# Patient Record
Sex: Female | Born: 1965 | Race: White | Hispanic: No | Marital: Married | Smoking: Former smoker
Health system: Southern US, Community
[De-identification: ages and names within clinical notes are randomized; demographics above are authoritative.]

## PROBLEM LIST (undated history)

## (undated) DIAGNOSIS — K219 Gastro-esophageal reflux disease without esophagitis: Secondary | ICD-10-CM

## (undated) DIAGNOSIS — I2510 Atherosclerotic heart disease of native coronary artery without angina pectoris: Secondary | ICD-10-CM

## (undated) DIAGNOSIS — F329 Major depressive disorder, single episode, unspecified: Secondary | ICD-10-CM

## (undated) DIAGNOSIS — E119 Type 2 diabetes mellitus without complications: Secondary | ICD-10-CM

## (undated) DIAGNOSIS — R011 Cardiac murmur, unspecified: Secondary | ICD-10-CM

## (undated) DIAGNOSIS — R002 Palpitations: Secondary | ICD-10-CM

## (undated) DIAGNOSIS — E785 Hyperlipidemia, unspecified: Secondary | ICD-10-CM

## (undated) DIAGNOSIS — T4145XA Adverse effect of unspecified anesthetic, initial encounter: Secondary | ICD-10-CM

## (undated) DIAGNOSIS — O24419 Gestational diabetes mellitus in pregnancy, unspecified control: Secondary | ICD-10-CM

## (undated) DIAGNOSIS — D649 Anemia, unspecified: Secondary | ICD-10-CM

## (undated) DIAGNOSIS — F32A Depression, unspecified: Secondary | ICD-10-CM

## (undated) DIAGNOSIS — M549 Dorsalgia, unspecified: Secondary | ICD-10-CM

## (undated) DIAGNOSIS — T8859XA Other complications of anesthesia, initial encounter: Secondary | ICD-10-CM

## (undated) DIAGNOSIS — D219 Benign neoplasm of connective and other soft tissue, unspecified: Secondary | ICD-10-CM

## (undated) DIAGNOSIS — I10 Essential (primary) hypertension: Secondary | ICD-10-CM

## (undated) DIAGNOSIS — F419 Anxiety disorder, unspecified: Secondary | ICD-10-CM

## (undated) DIAGNOSIS — Z86010 Personal history of colonic polyps: Secondary | ICD-10-CM

## (undated) HISTORY — DX: Atherosclerotic heart disease of native coronary artery without angina pectoris: I25.10

## (undated) HISTORY — DX: Gastro-esophageal reflux disease without esophagitis: K21.9

## (undated) HISTORY — DX: Dorsalgia, unspecified: M54.9

## (undated) HISTORY — DX: Gestational diabetes mellitus in pregnancy, unspecified control: O24.419

## (undated) HISTORY — DX: Palpitations: R00.2

## (undated) HISTORY — DX: Hyperlipidemia, unspecified: E78.5

## (undated) HISTORY — DX: Type 2 diabetes mellitus without complications: E11.9

## (undated) HISTORY — PX: COLONOSCOPY: SHX174

## (undated) HISTORY — DX: Benign neoplasm of connective and other soft tissue, unspecified: D21.9

## (undated) HISTORY — PX: POLYPECTOMY: SHX149

## (undated) HISTORY — PX: BACK SURGERY: SHX140

## (undated) HISTORY — DX: Personal history of colonic polyps: Z86.010

## (undated) HISTORY — PX: WISDOM TOOTH EXTRACTION: SHX21

---

## 1997-11-23 ENCOUNTER — Other Ambulatory Visit: Admission: RE | Admit: 1997-11-23 | Discharge: 1997-11-23 | Payer: Self-pay | Admitting: Obstetrics and Gynecology

## 1998-10-04 ENCOUNTER — Encounter: Admission: RE | Admit: 1998-10-04 | Discharge: 1999-01-02 | Payer: Self-pay | Admitting: Gynecology

## 1999-02-05 HISTORY — PX: TUBAL LIGATION: SHX77

## 1999-04-17 ENCOUNTER — Other Ambulatory Visit: Admission: RE | Admit: 1999-04-17 | Discharge: 1999-04-17 | Payer: Self-pay | Admitting: Internal Medicine

## 1999-07-25 ENCOUNTER — Ambulatory Visit (HOSPITAL_COMMUNITY): Admission: RE | Admit: 1999-07-25 | Discharge: 1999-07-25 | Payer: Self-pay | Admitting: Gynecology

## 1999-07-25 ENCOUNTER — Encounter: Admission: RE | Admit: 1999-07-25 | Discharge: 1999-10-23 | Payer: Self-pay | Admitting: Gynecology

## 1999-08-27 ENCOUNTER — Encounter: Payer: Self-pay | Admitting: Gynecology

## 1999-08-27 ENCOUNTER — Ambulatory Visit (HOSPITAL_COMMUNITY): Admission: RE | Admit: 1999-08-27 | Discharge: 1999-08-27 | Payer: Self-pay | Admitting: Gynecology

## 1999-10-04 ENCOUNTER — Inpatient Hospital Stay (HOSPITAL_COMMUNITY): Admission: AD | Admit: 1999-10-04 | Discharge: 1999-10-07 | Payer: Self-pay | Admitting: Gynecology

## 1999-10-04 ENCOUNTER — Encounter (INDEPENDENT_AMBULATORY_CARE_PROVIDER_SITE_OTHER): Payer: Self-pay

## 1999-11-05 ENCOUNTER — Other Ambulatory Visit: Admission: RE | Admit: 1999-11-05 | Discharge: 1999-11-05 | Payer: Self-pay | Admitting: Gynecology

## 2000-11-28 ENCOUNTER — Other Ambulatory Visit: Admission: RE | Admit: 2000-11-28 | Discharge: 2000-11-28 | Payer: Self-pay | Admitting: Obstetrics and Gynecology

## 2001-12-11 ENCOUNTER — Other Ambulatory Visit: Admission: RE | Admit: 2001-12-11 | Discharge: 2001-12-11 | Payer: Self-pay | Admitting: Obstetrics and Gynecology

## 2002-11-10 ENCOUNTER — Other Ambulatory Visit: Admission: RE | Admit: 2002-11-10 | Discharge: 2002-11-10 | Payer: Self-pay | Admitting: Obstetrics and Gynecology

## 2002-12-22 ENCOUNTER — Encounter: Payer: Self-pay | Admitting: Internal Medicine

## 2002-12-24 ENCOUNTER — Encounter (INDEPENDENT_AMBULATORY_CARE_PROVIDER_SITE_OTHER): Payer: Self-pay | Admitting: Specialist

## 2002-12-24 ENCOUNTER — Encounter: Payer: Self-pay | Admitting: Internal Medicine

## 2002-12-24 ENCOUNTER — Ambulatory Visit (HOSPITAL_COMMUNITY): Admission: RE | Admit: 2002-12-24 | Discharge: 2002-12-24 | Payer: Self-pay | Admitting: Internal Medicine

## 2002-12-24 DIAGNOSIS — Z860101 Personal history of adenomatous and serrated colon polyps: Secondary | ICD-10-CM

## 2002-12-24 DIAGNOSIS — Z86010 Personal history of colonic polyps: Secondary | ICD-10-CM

## 2002-12-24 HISTORY — DX: Personal history of adenomatous and serrated colon polyps: Z86.0101

## 2002-12-24 HISTORY — DX: Personal history of colonic polyps: Z86.010

## 2003-01-18 ENCOUNTER — Encounter: Payer: Self-pay | Admitting: Internal Medicine

## 2003-12-21 ENCOUNTER — Other Ambulatory Visit: Admission: RE | Admit: 2003-12-21 | Discharge: 2003-12-21 | Payer: Self-pay | Admitting: Obstetrics and Gynecology

## 2004-01-05 ENCOUNTER — Ambulatory Visit: Payer: Self-pay | Admitting: Internal Medicine

## 2004-01-18 ENCOUNTER — Ambulatory Visit: Payer: Self-pay | Admitting: Internal Medicine

## 2004-02-05 HISTORY — PX: NOVASURE ABLATION: SHX5394

## 2004-11-06 ENCOUNTER — Ambulatory Visit (HOSPITAL_COMMUNITY): Admission: RE | Admit: 2004-11-06 | Discharge: 2004-11-06 | Payer: Self-pay | Admitting: Obstetrics and Gynecology

## 2004-11-06 ENCOUNTER — Encounter (INDEPENDENT_AMBULATORY_CARE_PROVIDER_SITE_OTHER): Payer: Self-pay | Admitting: Specialist

## 2005-01-01 ENCOUNTER — Other Ambulatory Visit: Admission: RE | Admit: 2005-01-01 | Discharge: 2005-01-01 | Payer: Self-pay | Admitting: Obstetrics and Gynecology

## 2005-01-08 ENCOUNTER — Ambulatory Visit: Payer: Self-pay | Admitting: Internal Medicine

## 2005-02-19 ENCOUNTER — Ambulatory Visit: Payer: Self-pay | Admitting: Internal Medicine

## 2005-02-20 ENCOUNTER — Encounter: Payer: Self-pay | Admitting: Internal Medicine

## 2005-06-05 ENCOUNTER — Encounter: Payer: Self-pay | Admitting: Gynecology

## 2005-10-22 ENCOUNTER — Ambulatory Visit: Payer: Self-pay | Admitting: Internal Medicine

## 2005-10-28 ENCOUNTER — Encounter: Payer: Self-pay | Admitting: Internal Medicine

## 2005-10-28 ENCOUNTER — Ambulatory Visit (HOSPITAL_COMMUNITY): Admission: RE | Admit: 2005-10-28 | Discharge: 2005-10-28 | Payer: Self-pay | Admitting: Internal Medicine

## 2006-01-02 ENCOUNTER — Other Ambulatory Visit: Admission: RE | Admit: 2006-01-02 | Discharge: 2006-01-02 | Payer: Self-pay | Admitting: Obstetrics and Gynecology

## 2007-01-23 ENCOUNTER — Other Ambulatory Visit: Admission: RE | Admit: 2007-01-23 | Discharge: 2007-01-23 | Payer: Self-pay | Admitting: Obstetrics and Gynecology

## 2008-03-28 ENCOUNTER — Encounter: Payer: Self-pay | Admitting: Obstetrics and Gynecology

## 2008-03-28 ENCOUNTER — Ambulatory Visit: Payer: Self-pay | Admitting: Obstetrics and Gynecology

## 2008-03-28 ENCOUNTER — Other Ambulatory Visit: Admission: RE | Admit: 2008-03-28 | Discharge: 2008-03-28 | Payer: Self-pay | Admitting: Obstetrics and Gynecology

## 2008-03-29 ENCOUNTER — Encounter: Admission: RE | Admit: 2008-03-29 | Discharge: 2008-03-29 | Payer: Self-pay | Admitting: Obstetrics and Gynecology

## 2008-04-01 ENCOUNTER — Encounter: Admission: RE | Admit: 2008-04-01 | Discharge: 2008-04-01 | Payer: Self-pay | Admitting: Obstetrics and Gynecology

## 2008-04-08 ENCOUNTER — Emergency Department (HOSPITAL_COMMUNITY): Admission: EM | Admit: 2008-04-08 | Discharge: 2008-04-09 | Payer: Self-pay | Admitting: Emergency Medicine

## 2008-04-09 ENCOUNTER — Inpatient Hospital Stay (HOSPITAL_COMMUNITY): Admission: EM | Admit: 2008-04-09 | Discharge: 2008-04-11 | Payer: Self-pay | Admitting: *Deleted

## 2008-04-09 ENCOUNTER — Ambulatory Visit: Payer: Self-pay | Admitting: *Deleted

## 2008-04-13 ENCOUNTER — Encounter: Admission: RE | Admit: 2008-04-13 | Discharge: 2008-04-13 | Payer: Self-pay | Admitting: Obstetrics and Gynecology

## 2008-04-13 ENCOUNTER — Encounter: Payer: Self-pay | Admitting: Obstetrics and Gynecology

## 2008-05-23 ENCOUNTER — Ambulatory Visit: Payer: Self-pay | Admitting: Obstetrics and Gynecology

## 2008-11-18 ENCOUNTER — Encounter (INDEPENDENT_AMBULATORY_CARE_PROVIDER_SITE_OTHER): Payer: Self-pay | Admitting: *Deleted

## 2009-03-01 ENCOUNTER — Encounter (INDEPENDENT_AMBULATORY_CARE_PROVIDER_SITE_OTHER): Payer: Self-pay | Admitting: *Deleted

## 2009-03-10 LAB — LIPID PANEL
Cholesterol: 264 mg/dL — AB (ref 0–200)
HDL: 91 mg/dL — AB (ref 35–70)
LDL Cholesterol: 160 mg/dL
Triglycerides: 59 mg/dL (ref 40–160)

## 2009-03-10 LAB — BASIC METABOLIC PANEL: BUN: 21 mg/dL (ref 4–21)

## 2009-03-22 ENCOUNTER — Encounter (INDEPENDENT_AMBULATORY_CARE_PROVIDER_SITE_OTHER): Payer: Self-pay | Admitting: *Deleted

## 2009-03-27 ENCOUNTER — Ambulatory Visit: Payer: Self-pay | Admitting: Internal Medicine

## 2009-04-10 ENCOUNTER — Ambulatory Visit: Payer: Self-pay | Admitting: Internal Medicine

## 2009-04-13 ENCOUNTER — Encounter: Payer: Self-pay | Admitting: Internal Medicine

## 2009-04-17 ENCOUNTER — Encounter: Admission: RE | Admit: 2009-04-17 | Discharge: 2009-04-17 | Payer: Self-pay | Admitting: Obstetrics and Gynecology

## 2009-04-19 ENCOUNTER — Other Ambulatory Visit: Admission: RE | Admit: 2009-04-19 | Discharge: 2009-04-19 | Payer: Self-pay | Admitting: Obstetrics and Gynecology

## 2009-04-19 ENCOUNTER — Ambulatory Visit: Payer: Self-pay | Admitting: Obstetrics and Gynecology

## 2010-02-12 ENCOUNTER — Encounter: Payer: Self-pay | Admitting: Cardiovascular Disease

## 2010-02-12 DIAGNOSIS — E785 Hyperlipidemia, unspecified: Secondary | ICD-10-CM | POA: Insufficient documentation

## 2010-02-12 DIAGNOSIS — R002 Palpitations: Secondary | ICD-10-CM | POA: Insufficient documentation

## 2010-02-12 DIAGNOSIS — K30 Functional dyspepsia: Secondary | ICD-10-CM | POA: Insufficient documentation

## 2010-02-24 ENCOUNTER — Encounter: Payer: Self-pay | Admitting: Obstetrics and Gynecology

## 2010-03-07 ENCOUNTER — Ambulatory Visit (INDEPENDENT_AMBULATORY_CARE_PROVIDER_SITE_OTHER): Payer: BC Managed Care – PPO | Admitting: Cardiovascular Disease

## 2010-03-07 ENCOUNTER — Other Ambulatory Visit: Payer: Self-pay

## 2010-03-07 DIAGNOSIS — E7800 Pure hypercholesterolemia, unspecified: Secondary | ICD-10-CM

## 2010-03-08 NOTE — Letter (Signed)
Summary: [facility] Instructions  [facility]  [address] [place], [place] [address]   Phone: [phone]  Fax: [phone]       Nancy Sparks    25-Sep-1965    MRN: [id number]        Procedure Day /Date:  Monday 04/10/2009     Arrival Time: 8:00 am      Procedure Time: 9:00 am     Location of Procedure:                    _x _   Endoscopy Center (4th Floor)                        PREPARATION FOR COLONOSCOPY WITH MOVIPREP   Starting 5 days prior to your procedure Wednesday 3/2 do not eat nuts, seeds, popcorn, corn, beans, peas,  salads, or any raw vegetables.  Do not take any fiber supplements (e.g. Metamucil, Citrucel, and Benefiber).  THE DAY BEFORE YOUR PROCEDURE         DATE: Sunday 3/6  1.  Drink clear liquids the entire day-NO SOLID FOOD  2.  Do not drink anything colored red or purple.  Avoid juices with pulp.  No orange juice.  3.  Drink at least 64 oz. (8 glasses) of fluid/clear liquids during the day to prevent dehydration and help the prep work efficiently.  CLEAR LIQUIDS INCLUDE: Water Jello Ice Popsicles Tea (sugar ok, no milk/cream) Powdered fruit flavored drinks Coffee (sugar ok, no milk/cream) Gatorade Juice: apple, white grape, white cranberry  Lemonade Clear bullion, consomm, broth Carbonated beverages (any kind) Strained chicken noodle soup Hard Candy                             4.  In the morning, mix first dose of MoviPrep solution:    Empty 1 Pouch A and 1 Pouch B into the disposable container    Add lukewarm drinking water to the top line of the container. Mix to dissolve    Refrigerate (mixed solution should be used within 24 hrs)  5.  Begin drinking the prep at 5:00 p.m. The MoviPrep container is divided by 4 marks.   Every 15 minutes drink the solution down to the next mark (approximately 8 oz) until the full liter is complete.   6.  Follow completed prep with 16 oz of clear liquid of your choice (Nothing red  or purple).  Continue to drink clear liquids until bedtime.  7.  Before going to bed, mix second dose of MoviPrep solution:    Empty 1 Pouch A and 1 Pouch B into the disposable container    Add lukewarm drinking water to the top line of the container. Mix to dissolve    Refrigerate  THE DAY OF YOUR PROCEDURE      DATE: Monday 3/7  Beginning at 4:00 am (5 hours before procedure):         1. Every 15 minutes, drink the solution down to the next mark (approx 8 oz) until the full liter is complete.  2. Follow completed prep with 16 oz. of clear liquid of your choice.    3. You may drink clear liquids until 7:00 am  (2 HOURS BEFORE PROCEDURE).   MEDICATION INSTRUCTIONS  Unless otherwise instructed, you should take regular prescription medications with a small sip of water   as early as possible the morning  of your procedure.          OTHER INSTRUCTIONS  You will need a responsible adult at least 46 years of age to accompany you and drive you home.   This person must remain in the waiting room during your procedure.  Wear loose fitting clothing that is easily removed.  Leave jewelry and other valuables at home.  However, you may wish to bring a book to read or  an iPod/MP3 player to listen to music as you wait for your procedure to start.  Remove all body piercing jewelry and leave at home.  Total time from sign-in until discharge is approximately 2-3 hours.  You should go home directly after your procedure and rest.  You can resume normal activities the  day after your procedure.  The day of your procedure you should not:   Drive   Make legal decisions   Operate machinery   Drink alcohol   Return to work  You will receive specific instructions about eating, activities and medications before you leave.    The above instructions have been reviewed and explained to me by   Ezra Sites RN  March 27, 2009 10:28 AM     I fully understand and can verbalize  these instructions _____________________________ Date _________

## 2010-03-08 NOTE — Letter (Signed)
Summary: Previsit letter  [facility]  [address] [place], [place] [address]   Phone: [phone]  Fax: [phone]       03/01/2009 MRN: [id number]  Nancy Sparks [address] [place], [place]  [address]  Dear Ms. Castellana,  Welcome to the Gastroenterology Division at [facility].    You are scheduled to see a nurse for your pre-procedure visit on 03-27-09 at 10:00a.m. on the 3rd floor at [facility], [address]  We ask that you try to arrive at our office 15 minutes prior to your appointment time to allow for check-in.  Your nurse visit will consist of discussing your medical and surgical history, your immediate family medical history, and your medications.    Please bring a complete list of all your medications or, if you prefer, bring the medication bottles and we will list them.  We will need to be aware of both prescribed and over the counter drugs.  We will need to know exact dosage information as well.  If you are on blood thinners (Coumadin, Plavix, Aggrenox, Ticlid, etc.) please call our office today/prior to your appointment, as we need to consult with your physician about holding your medication.   Please be prepared to read and sign documents such as consent forms, a financial agreement, and acknowledgement forms.  If necessary, and with your consent, a friend or relative is welcome to sit-in on the nurse visit with you.  Please bring your insurance card so that we may make a copy of it.  If your insurance requires a referral to see a specialist, please bring your referral form from your primary care physician.  No co-pay is required for this nurse visit.     If you cannot keep your appointment, please call [phone] to cancel or reschedule prior to your appointment date.  This allows [place] the opportunity to schedule an appointment for another patient in need of care.    Thank you for choosing [facility] for your medical needs.  We  appreciate the opportunity to care for you.  Please visit [place] at our website  to learn more about our practice.                     Sincerely.                                                                                                                   The Gastroenterology Division

## 2010-03-08 NOTE — Procedures (Signed)
Summary: Colonoscopy/[facility]  Colonoscopy/[facility]   Imported By: Sherian Rein 04/28/2009 09:29:50  _____________________________________________________________________  External Attachment:    Type:   Image     Comment:   External Document

## 2010-03-08 NOTE — Miscellaneous (Signed)
Summary: [facility]  Clinical Lists Changes  Medications: Added new medication of MOVIPREP 100 GM  SOLR (PEG-KCL-NACL-NASULF-NA ASC-C) As per prep instructions. - Signed Rx of MOVIPREP 100 GM  SOLR (PEG-KCL-NACL-NASULF-NA ASC-C) As per prep instructions.;  #1 x 0;  Signed;  Entered by: Ezra Sites RN;  Authorized by: Iva Boop MD, [facility];  Method used: Electronically to [facility]. [place]. [phone]*, [address] [place], [place], [place]  [address], Ph: [phone] or [phone], Fax: [phone] Observations: Added new observation of NKA: T (03/27/2009 10:08)    Prescriptions: MOVIPREP 100 GM  SOLR (PEG-KCL-NACL-NASULF-NA ASC-C) As per prep instructions.  #1 x 0   Entered by:   Ezra Sites RN   Authorized by:   Iva Boop MD, [facility]   Signed by:   Ezra Sites RN on 03/27/2009   Method used:   Electronically to        [facility]. [address] [phone]* (retail)       [address]       [place], [place]  [address]       Ph: [phone] or [phone]       Fax: [phone]   RxID:   [id number]

## 2010-03-08 NOTE — Progress Notes (Signed)
Summary: [facility]   Imported By: Sherian Rein 04/28/2009 09:33:47  _____________________________________________________________________  External Attachment:    Type:   Image     Comment:   External Document

## 2010-03-08 NOTE — Progress Notes (Signed)
Summary: [facility]   Imported By: Sherian Rein 04/28/2009 09:31:44  _____________________________________________________________________  External Attachment:    Type:   Image     Comment:   External Document

## 2010-03-08 NOTE — Letter (Signed)
Summary: Patient Notice-Hyperplastic Polyps  [facility]  [address] [place], [place] [address]   Phone: [phone]  Fax: [phone]        April 13, 2009 MRN: [id number]    Nancy Sparks [address] [place], [place]  [address]    Dear [name],  I am pleased to inform you that the suspected colon polyp removed during your recent colonoscopy was not a colon polyp. It was lymphoid (lymph node-like) tissue that mimiced a polyp. This type of tissue is NOT pre-cancerous.  Since you have had a pre-cancerous polyp before and your sister had colon cancer, you should have a repeat colonoscopy examination in 5 years for routine colorectal cancer screening.  The biopsy of the ulcer showed changes that I think were related to effects of your colon preparation.  Should you develop new or worsening symptoms of abdominal pain, bowel habit changes or bleeding from the rectum or bowels, please schedule an evaluation with either your primary care physician or with me.  Please call us if you are having persistent problems or have questions about your condition that have not been fully answered at this time.    Sincerely,  Ashley Murrain MD, [facility] This letter has been electronically signed by your physician.  Appended Document: Patient Notice-Hyperplastic Polyps Letter mailed 3.10.11

## 2010-03-08 NOTE — Procedures (Signed)
Summary: Colonoscopy  Patient: Nancy Sparks Note: All result statuses are Final unless otherwise noted.  Tests: (1) Colonoscopy (COL)   COL Colonoscopy           DONE     [facility]     [address]     [place], [place]  [address]           COLONOSCOPY PROCEDURE REPORT           PATIENT:  Nancy Sparks, Nancy Sparks  MR#:  [id number]     BIRTHDATE:  [birth year], 45 yrs. 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  Digital rectal exam was performed and     revealed no abnormalities.   The LB PCF-[id number] endoscope     was introduced through the anus and advanced to the cecum, which     was identified by both the appendix and ileocecal valve, without     limitations.  The quality of the prep was excellent, using     MoviPrep.  The instrument was then slowly withdrawn as the colon     was fully examined.     Insertion: 5:21 minutes Withdrawal: 16:30 minutes     <<PROCEDUREIMAGES>>           FINDINGS:  A diminutive polyp was found at the appendiceal     orifice. It was 2 mm in size. The polyp was removed using cold     biopsy forceps.  An ulcer was found in the sigmoid colon.     Superficial ulceration in 2 areas noted in distal sigmoid.     Multiple biopsies were obtained and sent to pathology.  This was     otherwise a normal examination of the colon. Right colon     retroflexion preformed  also.   Retroflexed views in the rectum     revealed no abnormalities.    The scope was then withdrawn from     the patient and the procedure completed.           COMPLICATIONS:  None           ENDOSCOPIC IMPRESSION:     1) 2 mm diminutive polyp at the appendiceal orifice - removed     2) Ulcer in the sigmoid colon - biopsied - ? prep effect vs.     other cause (she has a hx of ischemic colitis in 2004)     3) Otherwise normal examination - excellent prep           4) Prior tubulovillous adenoma removal 2004     5) Family history  of colon cancer <60 yrs in sibling           REPEAT EXAM:  In for Colonoscopy, pending biopsy results. likely 5     years.           Iva Boop, MD, [facility]           CC:  The Patient     Carmelina Peal, MD     Herb Grays, MD           n.     eSIGNED:   Iva Boop at 04/10/2009 10:14 AM           [place], [place], [id number]  Note: An exclamation mark (!) indicates a result that was not dispersed into the flowsheet. Document Creation Date: 04/10/2009 10:14 AM _______________________________________________________________________  (1) Order result status: Final Collection or observation date-time: 04/10/2009 10:01 Requested date-time:  Receipt date-time:  Reported date-time:  Referring Physician:   Ordering Physician: Stan Head [phone]) Specimen Source:  Source: Launa Grill Order Number: [id number] Lab site:   Appended Document: Colonoscopy     Procedures Next Due Date:    Colonoscopy: 04/2014

## 2010-03-09 NOTE — Progress Notes (Signed)
Summary: [facility]   Imported By: Sherian Rein 04/28/2009 09:33:00  _____________________________________________________________________  External Attachment:    Type:   Image     Comment:   External Document

## 2010-04-26 ENCOUNTER — Other Ambulatory Visit: Payer: Self-pay | Admitting: Obstetrics and Gynecology

## 2010-04-26 DIAGNOSIS — Z1231 Encounter for screening mammogram for malignant neoplasm of breast: Secondary | ICD-10-CM

## 2010-05-01 ENCOUNTER — Ambulatory Visit: Payer: BC Managed Care – PPO

## 2010-05-17 LAB — URINALYSIS, ROUTINE W REFLEX MICROSCOPIC
Hgb urine dipstick: NEGATIVE
Ketones, ur: NEGATIVE mg/dL
Protein, ur: NEGATIVE mg/dL
Urobilinogen, UA: 0.2 mg/dL (ref 0.0–1.0)

## 2010-05-17 LAB — CBC
HCT: 41.5 % (ref 36.0–46.0)
MCHC: 34.3 g/dL (ref 30.0–36.0)
MCV: 92.2 fL (ref 78.0–100.0)
Platelets: 178 10*3/uL (ref 150–400)
RBC: 4.5 MIL/uL (ref 3.87–5.11)

## 2010-05-17 LAB — CK TOTAL AND CKMB (NOT AT ARMC)
CK, MB: 2.4 ng/mL (ref 0.3–4.0)
Total CK: 122 U/L (ref 7–177)

## 2010-05-17 LAB — COMPREHENSIVE METABOLIC PANEL
Albumin: 4.7 g/dL (ref 3.5–5.2)
Alkaline Phosphatase: 62 U/L (ref 39–117)
BUN: 17 mg/dL (ref 6–23)
CO2: 22 mEq/L (ref 19–32)
Chloride: 113 mEq/L — ABNORMAL HIGH (ref 96–112)
Creatinine, Ser: 0.79 mg/dL (ref 0.4–1.2)
GFR calc non Af Amer: >60 mL/min (ref >60)
Glucose, Bld: 94 mg/dL (ref 70–99)
Potassium: 4 mEq/L (ref 3.5–5.1)
Total Bilirubin: 0.6 mg/dL (ref 0.3–1.2)

## 2010-05-17 LAB — DIFFERENTIAL
Basophils Relative: 1 % (ref 0–1)
Eosinophils Absolute: 0.1 10*3/uL (ref 0.0–0.7)
Eosinophils Relative: 2 % (ref 0–5)
Monocytes Relative: 9 % (ref 3–12)
Neutrophils Relative %: 62 % (ref 43–77)

## 2010-05-17 LAB — BASIC METABOLIC PANEL
BUN: 17 mg/dL (ref 6–23)
CO2: 23 mEq/L (ref 19–32)
Chloride: 112 mEq/L (ref 96–112)
Creatinine, Ser: 0.8 mg/dL (ref 0.4–1.2)

## 2010-05-17 LAB — ACETAMINOPHEN LEVEL: Acetaminophen (Tylenol), Serum: <10 ug/mL — ABNORMAL LOW (ref 10–30)

## 2010-05-17 LAB — RAPID URINE DRUG SCREEN, HOSP PERFORMED
Barbiturates: NOT DETECTED
Benzodiazepines: POSITIVE — AB
Cocaine: NOT DETECTED

## 2010-05-17 LAB — SALICYLATE LEVEL: Salicylate Lvl: <4 mg/dL (ref 2.8–20.0)

## 2010-05-21 ENCOUNTER — Ambulatory Visit: Payer: BC Managed Care – PPO

## 2010-05-28 ENCOUNTER — Encounter: Payer: BC Managed Care – PPO | Admitting: Obstetrics and Gynecology

## 2010-05-29 ENCOUNTER — Encounter: Payer: Self-pay | Admitting: Cardiovascular Disease
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
## 2010-06-19 NOTE — Discharge Summary (Signed)
NAMEBRI, Nancy Sparks                ACCOUNT NO.:  [id number]   MEDICAL RECORD NO.:  [id number]          PATIENT TYPE:  IPS   LOCATION:  0302                          FACILITY:  [facility]   PHYSICIAN:  Jasmine Pang, M.D. DATE OF BIRTH:  [birth year]   DATE OF ADMISSION:  04/09/2008  DATE OF DISCHARGE:  04/11/2008                               DISCHARGE SUMMARY   IDENTIFICATION:  This is a 45 year old married white female who was  admitted on a voluntary basis on April 09, 2008.   HISTORY OF PRESENT ILLNESS:  The patient presented to the emergency  department at [facility], this is around midnight on April 08, 2008.  Her neighbor stated that the patient had taken 9 temazepam, 2-3  Klonopin, and half bottle of wine after arguing with her husband earlier  in the evening.  The ingestion of pills was not witnessed and when  admitted her urine drug screen was positive for benzodiazepines.  Her  alcohol level was elevated at 189.  She had no abnormality of  electrolytes.  She had no acetaminophen and her chloride was slightly  elevated at 113 from 112.  Otherwise, she had no abnormalities of her  chemistries and urinalysis was also negative.  Her original pregnancy  test was positive.  It was repeated along with a quantitative hCG and  she was found not to be pregnant.  The patient states that she had a  call back on a mammogram a week ago, yesterday.  She is to have a needle  biopsy on Monday morning.  Her husband accompanied her to this callback  meeting where she was told she would have to have a needle biopsy.  She  states she told her husband she did not want to talk about it.  The  argument was over the fact that he had not talked about it and she  realized she was wanting him to read her mind at any rate.  Yesterday,  after she sobered up some, she said she was not suicidal.  However, the  emergency room doctor insisted on taking out commitment papers.  The  commitment papers  read, the patient reports switching pills due to  flight of spells.  She states she is depressed and overwhelmed.  She  also drinks to self medicate.   PAST PSYCHIATRIC HISTORY:  The patient reports that she has had  outpatient care for depression on and off for about 10 years.  She has  recently been under steady care for the past year or so at [facility].   FAMILY HISTORY:  She states her mother is treated with Cymbalta for  depression and anxiety.   ALCOHOL AND DRUG HISTORY:  She denies abuse issues.  She states that she  just got out of hand the other night.   MEDICAL PROBLEMS:  She takes Crestor for high cholesterol.   MEDICATIONS:  1. Cymbalta 30 mg daily.  2. Wellbutrin XL 300 mg daily.  3. Crestor 20 mg p.o. daily.  4. Lysine 1 tablet p.o. daily.  5. Fish oil  1 tablet p.o. daily.  6. Multivitamin 1 tablet p.o. daily.  7. Klonopin 0.5 mg p.o. b.i.d. p.r.n.  8. Temazepam 30 mg at h.s. p.r.n.   ALLERGIES:  She has no known drug allergies.   PHYSICAL FINDINGS:  The patient is a well-developed, well-nourished  white female who appears her stated age of 13.  There were no acute  physical or medical problems noted.   ADMISSION LABORATORIES:  Her lab values have already been discussed and  she is to have a needle biopsy of a lesion noted on a mammogram on  Monday morning.   HOSPITAL COURSE:  Upon admission, the patient was restarted on her home  medications of Cymbalta 30 mg daily, Wellbutrin XL 300 mg daily. Crestor  20 mg daily, multivitamin 1 pill daily, Klonopin 0.5 mg p.o. b.i.d.  p.r.n., and temazepam 30 mg p.o. q.h.s. p.r.n.  She was also started on  Seroquel 15 mg at h.s. and Seroquel 25-50 mg p.o. every 4 hours as  needed for anxiety.  There was no change in her medications and she  tolerated these well with no significant side effects.  Initially upon  admission, the patient was depressed and anxious with depressed affect.  She had normal  motor behavior.  Speech was normal rate and flow.  Level  of consciousness was alert.  There was no psychosis or thought disorder  noted.  Her husband was called by our Child psychotherapist.  A family session  was scheduled.  Main concerns addressed were around how to increase  support for the patient, how to increase communication for all family  members.  The patient's husband and daughters expressed great concern  that if the patient would reach her limit with anxiety and try to kill  herself again.  Afterwards, the patient's daughters left the session  and discharge planning was discussed.  Husband felt better with the  patient staying in the hospital for at least 1 more day and the breast  biopsy that was scheduled for April 11, 2008, was rescheduled, so she  could stay here. The patient was scheduled at [facility] with Saul Fordyce.  They will send a counselor to her at this  visit.  On April 11, 2008, mental status had improved markedly from  admission status.  The patient was less depressed, less anxious.  She  felt the family session had gone well with her husband. Her affect was  consistent with mood, which was stable.  No suicidal or homicidal  ideation.  No thoughts of self-injurious behavior.  No auditory or  visual hallucinations.  No paranoia or delusions.  Thoughts were logical  and goal-directed.  Thought content, no predominant theme.  Cognitive  was grossly intact.  Insight good.  Judgment good.  Impulse control  good.  It was felt the patient was safe for discharge today.   DISCHARGE DIAGNOSES:  Axis I:  Depressive disorder, not otherwise  specified.  Axis II: None.  Axis III: Hypercholesterolemia, breast mass to have a biopsy of the  lesions noted on the mammogram soon.  Axis IV: Moderate (problems with primary support group, burden of  psychiatric illness, burden of medical illness).  Axis V: Global assessment of functioning was 65 at discharge.  GAF  60  upon admission.  GAF highest past year was 75-80.   DISCHARGE PLANS:  There was no specific activity level or dietary  restriction.   POSTHOSPITAL CARE PLANS:  The patient will go to the [facility] to see Saul Fordyce on April 12, 2008 at 1:30 p.m.  She  will also see Charlean Sanfilippo, night counselor and who will call her with  this appointment.   DISCHARGE MEDICATIONS:  1. Cymbalta 30 mg daily.  2. Wellbutrin XL 300 mg daily.  3. Crestor 20 mg daily.  4. Lysine 1 tablet daily.  5. Fish oil 1 tablet daily.  6. Multivitamin 1 tablet daily.  7. Klonopin 0.5 mg b.i.d. as needed.  8. Temazepam 30 mg at bed.      Jasmine Pang, M.D.  Electronically Signed     BHS/MEDQ  D:  04/11/2008  T:  04/12/2008  Job:  [id number]

## 2010-06-22 NOTE — Op Note (Signed)
[name], Nancy Sparks                ACCOUNT NO.:  [id number]   MEDICAL RECORD NO.:  [id number]          PATIENT TYPE:  AMB   LOCATION:  SDC                           [facility]ACILITY:  [facility]   PHYSICIAN:  Daniel L. Gottsegen, M.D.DATE OF BIRTH:  1965/06/09   DATE OF PROCEDURE:  11/06/2004  DATE OF DISCHARGE:                                 OPERATIVE REPORT   PREOPERATIVE DIAGNOSES:  1.  Menometrorrhagia.  2.  Possible endometrial polyp.   POSTOPERATIVE DIAGNOSES:  1.  Menometrorrhagia.  2.  Possible endometrial polyp.   OPERATION:  Hysteroscopy, endometrial sampling, followed by NovaSure  endometrial ablation.   SURGEON:  Daniel L. Eda Paschal, M.D.   ANESTHESIA:  General.   FINDINGS:  External is normal.  Vaginal exam is normal.  Cervix is clean.  Uterus is normal size and shape.  There is just slight descensus.  Adnexa  are not palpably enlarged.  At the time of hysteroscopy, the defect that had  been seen at the time of SIH appeared to be just some fluffy endometrium and  did not look like a true endometrial polyp, clearly did not appear to be  responsible for her clinical problem, see history and physical.   PROCEDURE:  After adequate general anesthesia, the patient is placed in  dorsal lithotomy position and prepped and draped in the usual sterile  manner.  Cervical and endometrial cavity length measurements were taken.  The sounded length of the uterus was 9 cm with the cervical length being 3  cm, giving a cavity length of 6.  The cervix was dilated to a #1 Pratt  dilator.  A hysteroscopic examination was done with the diagnostic  hysteroscope using Ringer's lactate to do the procedure because of the fact  that were going to use an ablation.  An excellent view was obtained.  There  appeared to be no intrauterine pathology to explain the above.  Endometrial  sampling was obtained, including the area that I think was just fluffy  endometrium rather than a true endometrial polyp.   The cervix was then  dilated up to a #8 Hegar dilator.  The NovaSure was introduced.  It was  manipulated in such a way that the cautery part would be completely  expanded.  The cavity width got to 4 cm.  The power was 132.  The seal was  tested and also to be sure there was not perforation and it went well.  Time  of the procedure was 1 minute 7 seconds.  She was then rehysteroscoped and  an excellent endometrial ablation had been obtained.  Blood loss for the  entire  procedure was less than 20 mL.  The tenaculum ripped slightly, and a figure-  of-eight of 3-0 Vicryl was placed on the cervix for bleeding and the  procedure was terminated.  The patient left the operating room in  satisfactory condition.      Daniel L. Eda Paschal, M.D.  Electronically Signed     DLG/MEDQ  D:  11/06/2004  T:  11/06/2004  Job:  [id number]

## 2010-06-22 NOTE — Discharge Summary (Signed)
[facility]  Patient:    Nancy Sparks, Nancy Sparks                       MRN: [id number] Adm. Date:  [id number] Disc. Date: [id number] Attending:  Tonye Royalty Dictator:   Antony Contras, [facility]                           Discharge Summary  DISCHARGE DIAGNOSES:          1. Intrauterine pregnancy at 38-1/2 weeks.                               2. History of previous hemolysis, elevated liver                                  enzymes, and low platelet count (HELPP)                                  syndrome.                               3. History of previous cesarean section.                               4. History of previous gestational diabetes.                               5. History of herpes simplex virus.                               6. Intrauterine growth retardation with current                                  pregnancy.  PROCEDURES:                   Low cervical transverse cesarean section with delivery of viable infant.  HISTORY OF PRESENT ILLNESS:   The patient is a 45 year old, Gravida 5, para 2-0-2-2, with LMP January 06, 2000, [facility] October 13, 1999.  Prenatal risk factors include a previous history of HELPP syndrome with her first pregnancy necessitating primary cesarean section, history of gestational diabetes in two previous pregnancies, history of elevated blood pressure, preeclampsia, oligohydramnios with her second pregnancy, history of HSV, and a history of IUGR with the current pregnancy which was easily managed by the patient stopping work and resting.  The patient also requests sterilization and repeat cesarean section with this pregnancy.  PRENATAL LABORATORY DATA:         Blood type 0 positive, antibody screen negative, rubella immune.  RPR, HBsAg, HIV nonreactive.  MSAFP normal. HOSPITAL COURSE AND TREATMENT:    The patient was admitted on October 04, 1999. Cesarean section and tubal sterilization were performed by Dr.  Lily Peer, assisted by Dr. Penni Homans.  Findings during the procedure revealed a true knot in the umbilical cord.  The patient was delivered of an Apgar 9/9 female infant  weighing 7 pounds 7 ounces.  During postoperatively course, the patient did remain afebrile, had no difficulty voiding.  Hematocrit 28.8, hemoglobin 10, WBC 6.2, platelets 142. She was also started on Paxil post delivery secondary to previous history of postpartum depression and was able to be discharged on her third postoperative day in satisfactory condition.  DISPOSITION:                      Follow up in office in six weeks.  Continue prenatal vitamins and iron.  Motrin and Tylox for pain. DD:  10/26/99 TD:  10/27/99 Job: [id number]

## 2010-06-22 NOTE — H&P (Signed)
NAMEALBERTO, Nancy Sparks                ACCOUNT NO.:  [id number]   MEDICAL RECORD NO.:  [id number]           PATIENT TYPE:   LOCATION:                                FACILITY:  [facility]   PHYSICIAN:  Daniel L. Gottsegen, M.D.DATE OF BIRTH:  Oct 22, 1965   DATE OF ADMISSION:  DATE OF DISCHARGE:                                HISTORY & PHYSICAL   CHIEF COMPLAINT:  Menometrorrhagia.   HISTORY OF PRESENT ILLNESS:  The patient is a 45 year old gravida 4, para 3,  AB 1 who over the past year has had progressively increasing menorrhagia.  She requires iron.  She has terrible clots.  Her periods are very heavy.  She is almost confined to the house as a result of this.  She has tried oral  contraceptives, many different types, but unfortunately has side effects  from them.  Nonsteroidal anti-inflammatory drugs have not worked.  Her TSH  is normal.  Saline-infusion hysterogram failed to reveal pathology.  As a  result of all of the above, she enters the hospital now for endometrial  ablation.  She appreciates that I cannot guarantee her that this will  resolve the problem, and with her history of C-sections, it is certainly  possible that she has adenomyosis that will not respond but she is willing  to do this with hope that it will respond enough that she would not require  additional surgery.   PAST MEDICAL HISTORY:  1.  Tubal ligation.  2.  There C-sections.   PRESENT MEDICATIONS:  Wellbutrin.   ALLERGIES:  None.   FAMILY HISTORY:  1.  The father is diabetic.  2.  Father and mother are hypertensive.   SOCIAL HISTORY:  She is a nonsmoker and drinks alcohol occasionally.   REVIEW OF SYSTEMS:  HEENT:  Negative.  CARDIAC:  Negative.  RESPIRATORY:  Negative.  GI:  Negative.  GU:  Negative.  NEUROMUSCULAR:  Negative.  ENDOCRINE:  Negative.   PHYSICAL EXAMINATION:  GENERAL:  The patient is a well-developed and well-  nourished female in no acute distress.  VITAL SIGNS:  Blood pressure is 118/74.   Pulse is 80 and regular.  Respirations 16 and nonlabored.  She is afebrile.  HEENT:  All within normal limits.  NECK:  Supple.  Trachea in the midline.  Thyroid is not enlarged.  LUNGS:  Clear to P&A.  HEART:  No thrills, heaves, or murmurs.  BREASTS:  No masses.  ABDOMEN:  Soft without guarding, rebound, or masses.  PELVIC:  External and vaginal are normal.  Cervix is clean.  Pap smear shows  no atypia.  Uterus is normal size and shape.  Adnexa are palpably normal.  RECTAL:  Negative.  EXTREMITIES:  Within normal limits.   ADMISSION IMPRESSION:  1.  Menorrhagia.  2.  Dysmenorrhea.   PLAN:  Endometrial ablation.      Daniel L. Eda Paschal, M.D.  Electronically Signed     DLG/MEDQ  D:  11/06/2004  T:  11/06/2004  Job:  [id number]

## 2010-06-22 NOTE — H&P (Signed)
[facility]  Patient:    Nancy Sparks, Nancy Sparks                       MRN: [id number] Adm. Date:  10/03/99 Attending:  Tonye Royalty                         History and Physical  DATE OF SURGERY:  October 04, 1999.  CHIEF COMPLAINT: 1. Previous C-section requesting elective repeat. 2. Request for elective permanent sterilization.  HISTORY OF PRESENT ILLNESS:  The patient is a 45 year old gravida 5, para 2, AB2 who estimated day of confinement is October 13, 1999, based on ultrasound.  The patient will be 38 and 1/2 weeks approaching 39 weeks at the time of her scheduled repeat C-section.  The patient had a history of  HELLP syndrome postpartum during her first pregnancy and had primary C-section secondary to breech presentation in her second pregnancy with repeat C-section secondary to elevated blood pressure, preeclampsia/oligohydramnios.  She had gestational diabetes with previous two pregnancies but not with this pregnancy.  She has a had a history of herpes simplex virus several years ago. During this pregnancy she also voiced that she wanted to have elective permanent sterilization during this pregnancy at time of her repeat C-section and early first trimester hemoglobin A1c has been done and is reported to be normal along with her 1 hour PC blood sugar at 28 weeks.  She suffered from depression during her pregnancy and had been placed on Zoloft 50 mg q. daily on August 10, 1999.  She also had IUGR on July 26, 1999, at 27 and 1/[redacted] weeks gestation with serial ultrasounds.  It appeared to have resolved based on patient instructed to stop her exercising and working and to be at rest at home which paid off.  PAST MEDICAL HISTORY:  She had a C-section for breech in 1996, and developed HELLP syndrome postpartum 1998 with repeat C-section at 37-1/2 weeks secondary to preeclampsia and a miscarriage in 1999 and 2000 respectively. She had gestational  diabetes in 1996 and 1998, and a history of HSV.  MEDICATIONS CONSIST: 1. Prenatal vitamins 1 p.o. q. daily 2. Zoloft 50 mg p.o. q. daily.  REVIEW OF SYSTEMS:  See ______ form.  PHYSICAL EXAMINATION:  GENERAL:  A well-developed, well-nourished female.  HEENT:  Unremarkable.  NECK:  Supple.  Trachea midline.  No carotid bruits.  No thyromegaly.  L;UNGS:  Clear to auscultation without rhonchis or wheezes.  HEART:  Regular rate and rhythm.  No murmur, rub or gallop.  BREAST:  Done during the first trimester was reported to be normal.  ABDOMEN:  Gravid uterus, vertex presentation by Thayer Ohm maneuver.  Positive fetal heart tones.  PELVIC EXAM:  Bartholin, Skene, urethra scan was within normal limits.  Cervix long and closed.  Vagina no gross lesions on inspection.  EXTREMITIES:  DTR 1+, negative clonus.  PRENATAL LABS:  Blood type 0+, negative antibody screen, VDRL nonreactive, rubella immune, hepatitis B surface antigen and HIV were negative.  Alpha fetoprotein protein was normal.  Diabetes screen was normal and GBS culture at 37 weeks was positive and patient also with supplemented iron for iron deficiency anemia.  ASSESSMENT:  A 45 year old gravida 5, para 2, AB2 at 14-39 weeks estimated gestational age with 2 previous C-sections and a past history of HELLP syndrome postpartum.  The patient is requesting elective repeat, also patient requesting elective permanent sterilization.  The risks, benefits, prostatitis and cons of the procedure were discussed.  She is fully aware that at the time and once sterilization is completed that she will not be able to have more any children.  Her husband and her were present during the counseling in the office and were 100% sure that they did not want to have more children.  The potential complications during the operation including infection, bleeding, trauma to internal organs, trauma to the fetus were discussed.  All questions were  answered and we will follow accordingly.  PLAN:  The patient is scheduled for repeat C-section and bilateral tubal sterilization procedure tomorrow October 04, 1999, at [facility]. DD:  10/03/99 TD:  10/03/99 Job: [id number]

## 2010-06-22 NOTE — H&P (Signed)
Nancy Sparks, Nancy Sparks                ACCOUNT NO.:  [id number]   MEDICAL RECORD NO.:  [id number]          PATIENT TYPE:  AMB   LOCATION:  SDC                           F[facility]ILITY[facility]   PHYSICIAN:  Daniel L. Gottsegen, M.D.DATE OF BIRTH:  09-25-1965   DATE OF ADMISSION:  10/22/2004  DATE OF DISCHARGE:                                HISTORY & PHYSICAL   CHIEF COMPLAINT:  Menometrorrhagia.   HISTORY OF PRESENT ILLNESS:  The patient is a 45 year old, gravida 4, para 3-  0-1-3, who has noticed over the past year a progressively increasing  menorrhagia.  She has very bad clots.  She has heavy periods.  It has become  intolerable.  She does have a little bit of dysmenorrhea with it but not as  severe.  She is status post 3 C-sections and a tubal ligation.  Her thyroid  was checked and was normal.  Saline infusion hysterogram was done.  There is  an exceedingly small polyp that is probably not the etiology of the above.  We could not identify adenomyosis on sonohysterogram.  As a result of this,  she enters the hospital for endometrial ablation by the NovaSure technique.  She appreciates that I cannot guarantee her that this will be the end of the  above.  She will be hysteroscoped prior to starting the procedure to be sure  that this polyp does not need pathological identification and if it does, it  will be removed.   PAST MEDICAL HISTORY:  Three previous C-sections.   PRESENT MEDICATIONS:  Anaprox.  She also takes Wellbutrin 300 mg XL.   ALLERGIES:  She is allergic to no drugs.   PREVIOUS SURGERY:  Three C-sections plus a tubal ligation.   FAMILY HISTORY:  Her father is diabetic with mother and father hypertensive.   REVIEW OF SYSTEMS:  HEENT:  Unremarkable.  CARDIAC:  Negative.  RESPIRATORY:  Negative.  GI:  Negative.  GU:  Negative.  MUSCULOSKELETAL:  Negative.  NEUROLOGICAL AND PSYCHIATRIC:  Need for Wellbutrin for depression.  ALLERGIC/IMMUNOLOGICAL/LYMPHATIC/ENDOCRINE:   Negative.   PHYSICAL EXAMINATION:  GENERAL:  The patient is a well-developed and well-  nourished female in no acute distress.  VITAL SIGNS:  Blood pressure is 130/70.  Pulse is 80 and regular.  Respirations 16 and nonlabored.  She is afebrile.  HEENT:  All within normal limits.  NECK:  Supple, trachea in the midline.  Thyroid is not enlarged.  LUNGS:  Clear to P&A.  HEART:  No thrills, heaves, or murmurs.  BREASTS:  No masses.  ABDOMEN:  Soft without guarding, rebound or masses.  PELVIC:  External is normal.  BUS is normal.  Vaginal is normal.  Cervix is  clean.  Pap smear shows no atypia.  Uterus is retroverted, normal size and  shape.  Adnexa are palpably normal.  Rectovaginal is negative.  EXTREMITIES:  Within normal limits.   ADMISSION IMPRESSION:  1.  Menometrorrhagia.  2.  Small endometrial polyp.   PLAN:  NovaSure endometrial ablation with possible removal of endometrial  polyp versus just ablating it.  Daniel L. Eda Paschal, M.D.  Electronically Signed     DLG/MEDQ  D:  10/18/2004  T:  10/18/2004  Job:  [id number]

## 2010-06-22 NOTE — Op Note (Signed)
[facility]  Patient:    Nancy Sparks, Nancy Sparks                       MRN: [id number] Proc. Date: 10/04/99 Adm. Date:  [id number] Attending:  Tonye Royalty                           Operative Report  PREOPERATIVE DIAGNOSES:       1. Term intrauterine pregnancy.                               2. Previous cesarean section x 2.                               3. Request for elective permanent sterilization.  POSTOPERATIVE DIAGNOSES:      1. Term intrauterine pregnancy.                               2. Previous cesarean section x 2.                               3. Request for elective permanent sterilization.  OPERATION/PROCEDURE:          1. Repeat lower uterine segment transverse                                  cesarean section.                               2. Bilateral tubal sterilization, [place]                                  technique. SURGEON:                      Juan H. Lily Peer, M.D.  ASSISTANT:                    Katy Fitch, M.D.  ANESTHESIA:                   Spinal.  INDICATIONS FOR PROCEDURE:    The patient is a 45 year old gravida 5 para 2 AB 2, currently at 21-[redacted] weeks gestation, with two previous cesarean section, for repeat along with elective permanent sterilization.  FINDINGS:                     Female infant, assigned Apgar scores of 9 at one minute and 9 at five minutes, weighing 7 pounds 7 ounces.  Clear amniotic fluid.  Thin lower uterine segment.  Normal maternal tubes and ovaries.  DESCRIPTION OF PROCEDURE:     After the patient successfully underwent spinal anesthesia she was placed in the supine position and a Foley catheter was placed.  The abdomen was prepped and draped in the usual sterile fashion and a Pfannenstiel skin incision made 2 cm above the symphysis pubis.  The incision was carried down through the skin and subcutaneous tissue down to the rectus fascia, where a  midline niche was made.  The midline  raphe was entered and the peritoneal cavity entered cautiously.  The bladder flap was established and the lower uterine segment incised in transverse fashion.  Clear amniotic fluid was present.  The newborn was delivered without any complications.  The cord was doubly clamped and excised, and the newborn gave an immediate cry and was passed off to the pediatricians in attendance, who assigned the above mentioned parameters.  It was noted that the umbilical cord had a true knot. The placenta was delivered from the intrauterine cavity and passed off the operative field.  The intrauterine cavity was swept clear of remaining products of conception after the uterus was exteriorized.  The uterine incision was closed in a single layer fashion with 0 Vicryl suture.  Attention was then placed to the proximal one-third portion of the right fallopian tube, where a 2 cm segment was suture ligated with 3-0 plain catgut suture and a second suture of similar suture material placed securing this 2 cm segment. This portion of the tube was excised and passed off the operative field. After histologic evaluation the remaining stumps were Bovie catheterized.  A similar procedure was carried out on the contralateral side.  Reinspection of the lower uterine segment demonstrated adequate hemostasis.  The uterus was then placed back into the abdominal cavity.  The pelvic cavity was copiously irrigated with normal saline solution and both tubal sterilization sites were reinspected, and adequate hemostasis was noted.  The visceral peritoneum was now reapproximated and the rectus fascia was closed with a running stitch of 0 Vicryl suture.  The subcutaneous bleeders were Bovie catheterized and the skin reapproximated with skin clips, followed by placement of Xeroform gauze and 4 x 4 dressing.  The patient was transferred to the recovery room with stable vital signs.  Blood loss for the procedure was 500 cc.  Urine  output was 300 cc.  IV fluids given was 3500 cc lactated Ringer. DD:  10/04/99 TD:  10/05/99 Job: [id number]

## 2010-08-20 ENCOUNTER — Encounter: Payer: BC Managed Care – PPO | Admitting: Obstetrics and Gynecology

## 2010-10-01 ENCOUNTER — Encounter: Payer: BC Managed Care – PPO | Admitting: Obstetrics and Gynecology

## 2010-10-18 ENCOUNTER — Encounter: Payer: BC Managed Care – PPO | Admitting: Obstetrics and Gynecology

## 2010-12-11 ENCOUNTER — Encounter: Payer: Self-pay | Admitting: Obstetrics and Gynecology

## 2010-12-11 ENCOUNTER — Ambulatory Visit (INDEPENDENT_AMBULATORY_CARE_PROVIDER_SITE_OTHER): Payer: BC Managed Care – PPO | Admitting: Obstetrics and Gynecology

## 2010-12-11 ENCOUNTER — Other Ambulatory Visit (HOSPITAL_COMMUNITY)
Admission: RE | Admit: 2010-12-11 | Discharge: 2010-12-11 | Disposition: A | Discharge status: Home / Self Care | Payer: BC Managed Care – PPO | Source: Ambulatory Visit | Attending: Obstetrics and Gynecology | Admitting: Obstetrics and Gynecology

## 2010-12-11 VITALS — BP 120/74 | Ht 66.0 in | Wt 155.0 lb

## 2010-12-11 DIAGNOSIS — Z01419 Encounter for gynecological examination (general) (routine) without abnormal findings: Secondary | ICD-10-CM

## 2010-12-11 DIAGNOSIS — N951 Menopausal and female climacteric states: Secondary | ICD-10-CM

## 2010-12-11 DIAGNOSIS — Z780 Asymptomatic menopausal state: Secondary | ICD-10-CM

## 2010-12-11 DIAGNOSIS — R002 Palpitations: Secondary | ICD-10-CM

## 2010-12-11 NOTE — Progress Notes (Signed)
Patient came to see me today for her annual GYN exam. She was very upset because she gained 10 pounds since last year in spite of increasing exercise. Her BMI is still normal. She remains amenorrheic since her endometrial ablation. She is up-to-date on mammograms. She drinks wine daily in small amounts. She thinks her diet is good but  might be a little bit better  Physical examination:   Kennon Portela present. HEENT within normal limits. Neck: Thyroid not large. No masses. Supraclavicular nodes: not enlarged. Breasts: Examined in both sitting midline position. No skin changes and no masses. Abdomen: Soft no guarding rebound or masses or hernia. Pelvic: External: Within normal limits. BUS: Within normal limits. Vaginal:within normal limits. Good estrogen effect. No evidence of cystocele rectocele or enterocele. Cervix: clean. Uterus: Normal size and shape. Adnexa: No masses. Rectovaginal exam: Confirmatory and negative. Extremities: Within normal limits.  Assessment: Weight gain  Plan: TSH and FSH drawn. Suggest nutritional consult. Also suggest that she discuss her lamictal with her psychiatrist to see if that is contributing to the weight. We will inform her of the results.

## 2011-03-21 ENCOUNTER — Other Ambulatory Visit: Payer: Self-pay | Admitting: *Deleted

## 2011-03-21 ENCOUNTER — Other Ambulatory Visit: Payer: Self-pay | Admitting: Cardiovascular Disease

## 2011-03-21 MED ORDER — ROSUVASTATIN CALCIUM 20 MG PO TABS: 10.0000 mg | ORAL_TABLET | Freq: Every day | ORAL | Status: DC

## 2011-03-21 NOTE — Telephone Encounter (Signed)
App made, pt will come in fasting, med refilled

## 2011-03-21 NOTE — Telephone Encounter (Signed)
New Msg: Pt calling wanting to speak with nurse/MD regarding pt RX. Pt stated that she believes [facility] has three refills of crestor that they will not give to pt. Pt would like for [place] to call [facility] and see if they have pt RX. Pt would also like to know when MD wrote RX for pt and how many refills and how many refills MD had written for pt.Please return pt call to discuss further.  In the mean time pt needs a refill of crestor 10 mg called into [facility] on [address]. Please return pt call inform when RX was called in.

## 2011-03-25 ENCOUNTER — Other Ambulatory Visit: Payer: Self-pay | Admitting: *Deleted

## 2011-03-25 MED ORDER — ROSUVASTATIN CALCIUM 10 MG PO TABS: 10.0000 mg | ORAL_TABLET | Freq: Every day | ORAL | Status: DC

## 2011-04-24 ENCOUNTER — Encounter (HOSPITAL_COMMUNITY): Payer: Self-pay | Admitting: Pharmacy Technician

## 2011-04-24 ENCOUNTER — Encounter (HOSPITAL_COMMUNITY): Payer: Self-pay | Admitting: *Deleted

## 2011-04-24 NOTE — Progress Notes (Signed)
Requested orders from [facility], message taken for [place].

## 2011-04-25 ENCOUNTER — Ambulatory Visit (HOSPITAL_COMMUNITY): Payer: BC Managed Care – PPO | Admitting: Anesthesiology

## 2011-04-25 ENCOUNTER — Encounter (HOSPITAL_COMMUNITY): Payer: Self-pay | Admitting: Anesthesiology

## 2011-04-25 ENCOUNTER — Encounter (HOSPITAL_COMMUNITY)
Admission: RE | Disposition: A | Discharge status: Home / Self Care | Payer: Self-pay | Source: Ambulatory Visit | Attending: Neurosurgery

## 2011-04-25 ENCOUNTER — Encounter (HOSPITAL_COMMUNITY): Payer: Self-pay | Admitting: *Deleted

## 2011-04-25 ENCOUNTER — Ambulatory Visit (HOSPITAL_COMMUNITY): Payer: BC Managed Care – PPO

## 2011-04-25 ENCOUNTER — Ambulatory Visit (HOSPITAL_COMMUNITY)
Admission: RE | Admit: 2011-04-25 | Discharge: 2011-04-26 | Disposition: A | Discharge status: Home / Self Care | Payer: BC Managed Care – PPO | Source: Ambulatory Visit | Attending: Neurosurgery | Admitting: Neurosurgery

## 2011-04-25 DIAGNOSIS — IMO0002 Reserved for concepts with insufficient information to code with codable children: Secondary | ICD-10-CM | POA: Insufficient documentation

## 2011-04-25 DIAGNOSIS — E785 Hyperlipidemia, unspecified: Secondary | ICD-10-CM | POA: Insufficient documentation

## 2011-04-25 DIAGNOSIS — M5126 Other intervertebral disc displacement, lumbar region: Secondary | ICD-10-CM | POA: Insufficient documentation

## 2011-04-25 DIAGNOSIS — M51379 Other intervertebral disc degeneration, lumbosacral region without mention of lumbar back pain or lower extremity pain: Secondary | ICD-10-CM | POA: Insufficient documentation

## 2011-04-25 DIAGNOSIS — M5137 Other intervertebral disc degeneration, lumbosacral region: Secondary | ICD-10-CM | POA: Insufficient documentation

## 2011-04-25 DIAGNOSIS — F3289 Other specified depressive episodes: Secondary | ICD-10-CM | POA: Insufficient documentation

## 2011-04-25 DIAGNOSIS — F329 Major depressive disorder, single episode, unspecified: Secondary | ICD-10-CM | POA: Insufficient documentation

## 2011-04-25 DIAGNOSIS — F411 Generalized anxiety disorder: Secondary | ICD-10-CM | POA: Insufficient documentation

## 2011-04-25 HISTORY — DX: Other complications of anesthesia, initial encounter: T88.59XA

## 2011-04-25 HISTORY — DX: Major depressive disorder, single episode, unspecified: F32.9

## 2011-04-25 HISTORY — DX: Depression, unspecified: F32.A

## 2011-04-25 HISTORY — DX: Anxiety disorder, unspecified: F41.9

## 2011-04-25 HISTORY — DX: Adverse effect of unspecified anesthetic, initial encounter: T41.45XA

## 2011-04-25 HISTORY — PX: LUMBAR LAMINECTOMY/DECOMPRESSION MICRODISCECTOMY: SHX5026

## 2011-04-25 SURGERY — LUMBAR LAMINECTOMY/DECOMPRESSION MICRODISCECTOMY 1 LEVEL
Anesthesia: General | Site: Back | Laterality: Left | Wound class: Clean

## 2011-04-25 MED ORDER — CEFAZOLIN SODIUM 1-5 GM-% IV SOLN
INTRAVENOUS | Status: DC | PRN
  Administered 2011-04-25: 1 g via INTRAVENOUS

## 2011-04-25 MED ORDER — KETOROLAC TROMETHAMINE 60 MG/2ML IM SOLN
INTRAMUSCULAR | Status: DC | PRN
  Administered 2011-04-25: 30 mg via INTRAMUSCULAR

## 2011-04-25 MED ORDER — ALUM & MAG HYDROXIDE-SIMETH 200-200-20 MG/5ML PO SUSP: 30.0000 mL | Freq: Four times a day (QID) | ORAL | Status: DC | PRN

## 2011-04-25 MED ORDER — LIDOCAINE HCL (CARDIAC) 20 MG/ML IV SOLN
INTRAVENOUS | Status: DC | PRN
  Administered 2011-04-25: 50 mg via INTRAVENOUS

## 2011-04-25 MED ORDER — HYDROMORPHONE HCL PF 1 MG/ML IJ SOLN
0.5000 mg | INTRAMUSCULAR | Status: DC | PRN
Start: 2011-04-25 — End: 2011-04-26

## 2011-04-25 MED ORDER — CEFAZOLIN SODIUM 1-5 GM-% IV SOLN
1.0000 g | Freq: Three times a day (TID) | INTRAVENOUS | Status: AC
  Administered 2011-04-26 (×3): 1 g via INTRAVENOUS
  Filled 2011-04-25 (×2): qty 50

## 2011-04-25 MED ORDER — ONDANSETRON HCL 4 MG/2ML IJ SOLN: 4.0000 mg | Freq: Four times a day (QID) | INTRAMUSCULAR | Status: DC | PRN

## 2011-04-25 MED ORDER — BUPROPION HCL ER (XL) 150 MG PO TB24
150.0000 mg | ORAL_TABLET | Freq: Every day | ORAL | Status: DC
  Filled 2011-04-25: qty 1

## 2011-04-25 MED ORDER — BUPIVACAINE HCL (PF) 0.25 % IJ SOLN
INTRAMUSCULAR | Status: DC | PRN
  Administered 2011-04-25: 10 mL

## 2011-04-25 MED ORDER — HEMOSTATIC AGENTS (NO CHARGE) OPTIME
TOPICAL | Status: DC | PRN
  Administered 2011-04-25: 1 via TOPICAL

## 2011-04-25 MED ORDER — LAMOTRIGINE 100 MG PO TABS
100.0000 mg | ORAL_TABLET | Freq: Every day | ORAL | Status: DC
  Administered 2011-04-25: 100 mg via ORAL
  Filled 2011-04-25 (×2): qty 1

## 2011-04-25 MED ORDER — MIDAZOLAM HCL 5 MG/5ML IJ SOLN
INTRAMUSCULAR | Status: DC | PRN
  Administered 2011-04-25: 2 mg via INTRAVENOUS

## 2011-04-25 MED ORDER — PROPOFOL 10 MG/ML IV EMUL
INTRAVENOUS | Status: DC | PRN
  Administered 2011-04-25: 180 mg via INTRAVENOUS

## 2011-04-25 MED ORDER — EPHEDRINE SULFATE 50 MG/ML IJ SOLN
INTRAMUSCULAR | Status: DC | PRN
  Administered 2011-04-25: 5 mg via INTRAVENOUS
  Administered 2011-04-25: 10 mg via INTRAVENOUS
  Administered 2011-04-25: 5 mg via INTRAVENOUS

## 2011-04-25 MED ORDER — ZOLPIDEM TARTRATE 5 MG PO TABS
10.0000 mg | ORAL_TABLET | Freq: Every evening | ORAL | Status: DC | PRN
  Administered 2011-04-25: 10 mg via ORAL
  Filled 2011-04-25: qty 2

## 2011-04-25 MED ORDER — MUPIROCIN 2 % EX OINT
TOPICAL_OINTMENT | CUTANEOUS | Status: AC
  Administered 2011-04-25: 15:00:00 via NASAL
  Filled 2011-04-25: qty 22

## 2011-04-25 MED ORDER — MUPIROCIN 2 % EX OINT
TOPICAL_OINTMENT | Freq: Two times a day (BID) | CUTANEOUS | Status: DC
  Administered 2011-04-26: via NASAL
  Filled 2011-04-25: qty 22

## 2011-04-25 MED ORDER — ROCURONIUM BROMIDE 100 MG/10ML IV SOLN
INTRAVENOUS | Status: DC | PRN
  Administered 2011-04-25: 50 mg via INTRAVENOUS
  Administered 2011-04-25: 20 mg via INTRAVENOUS

## 2011-04-25 MED ORDER — LIDOCAINE-EPINEPHRINE 1 %-1:100000 IJ SOLN
INTRAMUSCULAR | Status: DC | PRN
  Administered 2011-04-25: 10 mL

## 2011-04-25 MED ORDER — NEOSTIGMINE METHYLSULFATE 1 MG/ML IJ SOLN
INTRAMUSCULAR | Status: DC | PRN
  Administered 2011-04-25: 4 mg via INTRAVENOUS

## 2011-04-25 MED ORDER — OXYCODONE-ACETAMINOPHEN 5-325 MG PO TABS
1.0000 | ORAL_TABLET | ORAL | Status: DC | PRN
  Administered 2011-04-25 – 2011-04-26 (×3): 2 via ORAL
  Filled 2011-04-25 (×3): qty 2

## 2011-04-25 MED ORDER — DIPHENHYDRAMINE HCL 25 MG PO CAPS
25.0000 mg | ORAL_CAPSULE | Freq: Four times a day (QID) | ORAL | Status: DC | PRN
  Administered 2011-04-25: 25 mg via ORAL
  Filled 2011-04-25 (×2): qty 1

## 2011-04-25 MED ORDER — PHENOL 1.4 % MT LIQD: 1.0000 | OROMUCOSAL | Status: DC | PRN

## 2011-04-25 MED ORDER — SUFENTANIL CITRATE 50 MCG/ML IV SOLN
INTRAVENOUS | Status: DC | PRN
  Administered 2011-04-25: 20 ug via INTRAVENOUS
  Administered 2011-04-25 (×2): 10 ug via INTRAVENOUS

## 2011-04-25 MED ORDER — ATORVASTATIN CALCIUM 10 MG PO TABS
10.0000 mg | ORAL_TABLET | Freq: Every day | ORAL | Status: DC
  Administered 2011-04-25: 10 mg via ORAL
  Filled 2011-04-25 (×2): qty 1

## 2011-04-25 MED ORDER — LACTATED RINGERS IV SOLN
INTRAVENOUS | Status: DC | PRN
  Administered 2011-04-25 (×2): via INTRAVENOUS

## 2011-04-25 MED ORDER — SODIUM CHLORIDE 0.9 % IR SOLN
Status: DC | PRN
  Administered 2011-04-25: 18:00:00

## 2011-04-25 MED ORDER — GLYCOPYRROLATE 0.2 MG/ML IJ SOLN
INTRAMUSCULAR | Status: DC | PRN
  Administered 2011-04-25: 0.6 mg via INTRAVENOUS

## 2011-04-25 MED ORDER — CYCLOBENZAPRINE HCL 10 MG PO TABS
10.0000 mg | ORAL_TABLET | Freq: Three times a day (TID) | ORAL | Status: DC | PRN
  Administered 2011-04-25: 10 mg via ORAL
  Filled 2011-04-25: qty 1

## 2011-04-25 MED ORDER — HYDROMORPHONE HCL PF 1 MG/ML IJ SOLN
0.2500 mg | INTRAMUSCULAR | Status: DC | PRN
  Administered 2011-04-25 (×4): 0.5 mg via INTRAVENOUS

## 2011-04-25 MED ORDER — ONDANSETRON HCL 4 MG/2ML IJ SOLN
4.0000 mg | INTRAMUSCULAR | Status: DC | PRN
Start: 2011-04-25 — End: 2011-04-26

## 2011-04-25 MED ORDER — ACETAMINOPHEN 650 MG RE SUPP: 650.0000 mg | RECTAL | Status: DC | PRN

## 2011-04-25 MED ORDER — MENTHOL 3 MG MT LOZG: 1.0000 | LOZENGE | OROMUCOSAL | Status: DC | PRN

## 2011-04-25 MED ORDER — ACETAMINOPHEN 325 MG PO TABS: 650.0000 mg | ORAL_TABLET | ORAL | Status: DC | PRN

## 2011-04-25 MED ORDER — SODIUM CHLORIDE 0.9 % IJ SOLN
3.0000 mL | Freq: Two times a day (BID) | INTRAMUSCULAR | Status: DC
  Administered 2011-04-25: 3 mL via INTRAVENOUS

## 2011-04-25 MED ORDER — THROMBIN 5000 UNITS EX SOLR
CUTANEOUS | Status: DC | PRN
  Administered 2011-04-25 (×2): 5000 [IU] via TOPICAL

## 2011-04-25 MED ORDER — 0.9 % SODIUM CHLORIDE (POUR BTL) OPTIME
TOPICAL | Status: DC | PRN
  Administered 2011-04-25: 1000 mL

## 2011-04-25 MED ORDER — ONDANSETRON HCL 4 MG/2ML IJ SOLN
INTRAMUSCULAR | Status: DC | PRN
  Administered 2011-04-25: 4 mg via INTRAVENOUS

## 2011-04-25 SURGICAL SUPPLY — 54 items
BAG DECANTER FOR FLEXI CONT (MISCELLANEOUS) ×2 IMPLANT
BENZOIN TINCTURE PRP APPL 2/3 (GAUZE/BANDAGES/DRESSINGS) ×2 IMPLANT
BLADE SURG 11 STRL SS (BLADE) ×2 IMPLANT
BLADE SURG ROTATE 9660 (MISCELLANEOUS) IMPLANT
BRUSH SCRUB EZ PLAIN DRY (MISCELLANEOUS) ×2 IMPLANT
BUR MATCHSTICK NEURO 3.0 LAGG (BURR) ×2 IMPLANT
BUR PRECISION FLUTE 6.0 (BURR) ×2 IMPLANT
CANISTER SUCTION 2500CC (MISCELLANEOUS) ×2 IMPLANT
CLOTH BEACON ORANGE TIMEOUT ST (SAFETY) ×2 IMPLANT
CONT SPEC 4OZ CLIKSEAL STRL BL (MISCELLANEOUS) ×2 IMPLANT
DECANTER SPIKE VIAL GLASS SM (MISCELLANEOUS) ×2 IMPLANT
DERMABOND ADVANCED (GAUZE/BANDAGES/DRESSINGS) ×1
DERMABOND ADVANCED .7 DNX12 (GAUZE/BANDAGES/DRESSINGS) ×1 IMPLANT
DRAPE LAPAROTOMY 100X72X124 (DRAPES) ×2 IMPLANT
DRAPE MICROSCOPE LEICA (MISCELLANEOUS) ×2 IMPLANT
DRAPE MICROSCOPE ZEISS OPMI (DRAPES) IMPLANT
DRAPE POUCH INSTRU U-SHP 10X18 (DRAPES) ×2 IMPLANT
DRAPE PROXIMA HALF (DRAPES) IMPLANT
DRAPE SURG 17X23 STRL (DRAPES) ×2 IMPLANT
DRSG OPSITE 4X5.5 SM (GAUZE/BANDAGES/DRESSINGS) ×2 IMPLANT
ELECT REM PT RETURN 9FT ADLT (ELECTROSURGICAL) ×2
ELECTRODE REM PT RTRN 9FT ADLT (ELECTROSURGICAL) ×1 IMPLANT
GAUZE SPONGE 4X4 16PLY XRAY LF (GAUZE/BANDAGES/DRESSINGS) IMPLANT
GLOVE BIO SURGEON STRL SZ 6.5 (GLOVE) ×6 IMPLANT
GLOVE BIO SURGEON STRL SZ8 (GLOVE) ×2 IMPLANT
GLOVE BIOGEL PI IND STRL 6.5 (GLOVE) ×2 IMPLANT
GLOVE BIOGEL PI INDICATOR 6.5 (GLOVE) ×2
GLOVE ECLIPSE 7.5 STRL STRAW (GLOVE) ×2 IMPLANT
GLOVE EXAM NITRILE LRG STRL (GLOVE) IMPLANT
GLOVE EXAM NITRILE MD LF STRL (GLOVE) IMPLANT
GLOVE EXAM NITRILE XL STR (GLOVE) IMPLANT
GLOVE EXAM NITRILE XS STR PU (GLOVE) IMPLANT
GLOVE INDICATOR 8.5 STRL (GLOVE) ×2 IMPLANT
GOWN BRE IMP SLV AUR LG STRL (GOWN DISPOSABLE) ×6 IMPLANT
GOWN BRE IMP SLV AUR XL STRL (GOWN DISPOSABLE) ×2 IMPLANT
GOWN STRL REIN 2XL LVL4 (GOWN DISPOSABLE) IMPLANT
KIT BASIN OR (CUSTOM PROCEDURE TRAY) ×2 IMPLANT
KIT ROOM TURNOVER OR (KITS) ×2 IMPLANT
NEEDLE HYPO 22GX1.5 SAFETY (NEEDLE) ×2 IMPLANT
NEEDLE SPNL 22GX3.5 QUINCKE BK (NEEDLE) ×2 IMPLANT
NS IRRIG 1000ML POUR BTL (IV SOLUTION) ×2 IMPLANT
PACK LAMINECTOMY NEURO (CUSTOM PROCEDURE TRAY) ×2 IMPLANT
RUBBERBAND STERILE (MISCELLANEOUS) ×4 IMPLANT
SPONGE GAUZE 4X4 12PLY (GAUZE/BANDAGES/DRESSINGS) ×2 IMPLANT
SPONGE SURGIFOAM ABS GEL SZ50 (HEMOSTASIS) ×2 IMPLANT
STRIP CLOSURE SKIN 1/2X4 (GAUZE/BANDAGES/DRESSINGS) ×2 IMPLANT
SUT VIC AB 0 CT1 18XCR BRD8 (SUTURE) ×1 IMPLANT
SUT VIC AB 0 CT1 8-18 (SUTURE) ×1
SUT VIC AB 2-0 CT1 18 (SUTURE) ×2 IMPLANT
SUT VICRYL 4-0 PS2 18IN ABS (SUTURE) ×2 IMPLANT
SYR 20ML ECCENTRIC (SYRINGE) ×2 IMPLANT
TOWEL OR 17X24 6PK STRL BLUE (TOWEL DISPOSABLE) ×2 IMPLANT
TOWEL OR 17X26 10 PK STRL BLUE (TOWEL DISPOSABLE) ×2 IMPLANT
WATER STERILE IRR 1000ML POUR (IV SOLUTION) ×2 IMPLANT

## 2011-04-25 NOTE — Transfer of Care (Signed)
Immediate Anesthesia Transfer of Care Note  Patient: Nancy Sparks  Procedure(s) Performed: Procedure(s) (LRB): LUMBAR LAMINECTOMY/DECOMPRESSION MICRODISCECTOMY 1 LEVEL (Left)  Patient Location: PACU  Anesthesia Type: General  Level of Consciousness: awake  Airway & Oxygen Therapy: Patient Spontanous Breathing  Post-op Assessment: Report given to PACU RN  Post vital signs: stable  Complications: No apparent anesthesia complications

## 2011-04-25 NOTE — H&P (Signed)
Nancy Sparks is an 46 y.o. female.  Chief Complaint: Back and bilateral leg pain HPI: Patient is a very pleasant 46 year old female who for the last 2 weeks have progressive worsening back and what initially began his left leg pain progressed to become right leg pain radiates to her buttock down the back of her leg outside of her thigh the front of her shin. She gets some was some anti-inflammatories and rest however on initial evaluation exam she has a weakness on dorsiflexion of her left foot and some decreased sensation in L4 distribution the left leg. 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 Due to patient's weakness her progression of clinical syndrome an MRI and imaging findings she was recommended laminectomy and microdiscectomy at L3-4 I recommended coming up in the left side of her weakness and also most of the disc lateralized to the left. X-rays were the risks benefits of the operation with her as well as perioperative course and expectations of outcome and alternatives of surgery she understands and agrees to proceed forward.  Past Medical History  Diagnosis Date  . Dyslipidemia   . Fibroid   . Heart palpitations     hx of  . Anxiety   . Depression   . Complication of anesthesia     itching relieved with benadryl     Past Surgical History  Procedure Date  . Cesarean section     X3  . Tubal ligation   . Novasure ablation 2006  . Colonoscopy     Family History  Problem Relation Age of Onset  . Hyperlipidemia Mother   . Hypertension Mother   . Anesthesia problems Mother   . Coronary artery disease Father     CABGx3 age 70  . Hyperlipidemia Father   . Atrial fibrillation Father   . Hypertension Father   . Heart disease Father   . Diabetes Father   . Hyperlipidemia Brother     x2  . Hyperlipidemia Sister     X28  . Cancer  Sister     colon cancer  . Thyroid disease Sister   . Anesthesia problems Sister    Social History:  reports that she has never smoked. She does not have any smokeless tobacco history on file. She reports that she drinks alcohol. She reports that she does not use illicit drugs.  Allergies:  Allergies  Allergen Reactions  . Tape Rash    Paper tape ok    Medications Prior to Admission  Medication Dose Route Frequency Provider Last Rate Last Dose  . mupirocin ointment (BACTROBAN) 2 %            Medications Prior to Admission  Medication Sig Dispense Refill  . lamoTRIgine (LAMICTAL) 100 MG tablet Take 100 mg by mouth daily.       . rosuvastatin (CRESTOR) 10 MG tablet Take 10 mg by mouth daily.        Results for orders placed during the hospital encounter of 04/25/11 (from the past 48 hour(s))  SURGICAL PCR SCREEN     Status: Abnormal   Collection Time   04/25/11  3:19 PM      Component Value Range Comment   MRSA, PCR NEGATIVE  NEGATIVE     Staphylococcus aureus POSITIVE (*) NEGATIVE     No results found.  Review of Systems  Constitutional:  Negative.   HENT: Negative.   Eyes: Negative.   Respiratory: Negative.   Cardiovascular: Negative.   Gastrointestinal: Negative.   Genitourinary: Negative.   Musculoskeletal: Positive for back pain.  Skin: Negative.   Neurological: Positive for tingling.    Blood pressure 112/69, pulse 85, temperature 97.5 F (36.4 C), temperature source Oral, resp. rate 18, height 5\' 6"  (1.676 m), weight 68.04 kg (150 lb), SpO2 93.00%. Physical Exam  Constitutional: She is oriented to person, place, and time. She appears well-developed and well-nourished.  HENT:  Head: Normocephalic.  Eyes: Pupils are equal, round, and reactive to light.  Neck: Normal range of motion. Neck supple.  Respiratory: Effort normal.  GI: Soft.  Neurological: She is alert and oriented to person, place, and time. GCS eye subscore is 4. GCS verbal subscore is 5. GCS  motor subscore is 6.  Reflex Scores:      Patellar reflexes are 0 on the right side and 0 on the left side.      Achilles reflexes are 0 on the right side.      4+ out of 5 weakness in dorsiflexion the left foot otherwise she is 5 out of 5 in the right and left lower 70s in her iliopsoas quads hamstrings gastrocs and tibialis anterior     Assessment/Plan  46 year old female presents for a left side L3-4 laminectomy and microdiscectomy is explained the risks and benefits of the operation to her she understands and agrees to proceed forward. Kobi Aller P 04/25/2011, 5:54 PM

## 2011-04-25 NOTE — Op Note (Signed)
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  EBL: Minimal  History of present illness: He is a 45 of them is at rest worsening back and bilateral leg pain and discomfort is a worse in the right fascia weakness to dorsiflexion the left eye scan and preoperative imaging showed largest disc herniation with severe spinal stenosis causing marked displacement thecal sac and L4 nerve root on the left and the patient's weakness in dorsiflexion MRI findings and progression of clinical syndrome this is recommended limiting the discectomy the risks and benefits of the operation with her shin showed and agreed to proceed forward in addition to explaining the perioperative course and expectations of outcome and alternatives surgery.  Operative procedure: Patient brought into the or was induced under general anesthesia and positioned prone the Wilson frame her back was prepped and draped in routine sterile fashion. Preoperative localizing to the level of L3-4 and after infiltration 10 cc lidocaine with epi a midline incision was made and Bovie light cautery was used to take down the subcutaneous tissues and subperiosteal dissection carried on the lamina of L3-L4 on the left. Interoperative X. identify the appropriate level at L3-4 and then the high-speed drill was used to drill down The interest of the 3 medial facet complexes suppressant of L4 on the was begun with a 2 and 3 mm Kerrison punch. Then ligament was identified and the markedly hypertrophied and this is all removed in piece of fashion exposing the thecal sac. Under microscopic illumination the undersurface and the other was further out of bed and ligament was again noted are markedly hypertrophied  and this is all carried out to identify the L4 pedicle L4 was identified. Epidural veins are coagulated and initially presented very large free fragment this was teased out from underneath forward with I nerve several more fat was removed and the 4 root and a working superiorly back to the space the annular rent was identified and this was extended the annulotomy with disc space cleanout pituitary rongeurs the the thecal sac is a densely adherent to the super aspect of the disc space at the level of the L3 inferior endplate was teased off and opened up Epstein curettes feature is her shoes P. further clean the disc space several fibers removed the central compartment decompress the central canal the the air was explored no further fibers appreciated I was able to pass a probe out underneath the 4 roots to the level of the interest of the L4 pedicle down almost to the L4-5 disc space and no further fibers her resistance to passing the probe was appreciated. Patch protocol across the midline to the right side and also felt no further resistance there is well. The wound was then copious irrigated meticulous he states was maintained Gelfoam was laid over the dura the muscle fascia pressure layers with interrupted Vicryl and the skin was closed running 4 subcuticular and the incisions were applied patient recovered in stable condition. At the end of the case all needle counts and sponge counts were correct per the nurses.

## 2011-04-25 NOTE — Anesthesia Preprocedure Evaluation (Addendum)
Anesthesia Evaluation  Patient identified by MRN, date of birth, ID band Patient awake    Reviewed: Allergy & Precautions, H&P , NPO status , Patient's Chart, lab work & pertinent test results  Airway Mallampati: II  Neck ROM: full    Dental  (+) Teeth Intact and Dental Advisory Given   Pulmonary          Cardiovascular     Neuro/Psych PSYCHIATRIC DISORDERS Anxiety Depression    GI/Hepatic   Endo/Other    Renal/GU      Musculoskeletal   Abdominal   Peds  Hematology   Anesthesia Other Findings   Reproductive/Obstetrics                          Anesthesia Physical Anesthesia Plan  ASA: II  Anesthesia Plan: General   Post-op Pain Management:    Induction: Intravenous  Airway Management Planned: Oral ETT  Additional Equipment:   Intra-op Plan:   Post-operative Plan: Extubation in OR  Informed Consent: I have reviewed the patients History and Physical, chart, labs and discussed the procedure including the risks, benefits and alternatives for the proposed anesthesia with the patient or authorized representative who has indicated his/her understanding and acceptance.     Plan Discussed with: CRNA and Surgeon  Anesthesia Plan Comments:         Anesthesia Quick Evaluation

## 2011-04-25 NOTE — Progress Notes (Signed)
Nancy Sparks in neuro or advised pt is ready .

## 2011-04-25 NOTE — Preoperative (Signed)
Beta Blockers   Reason not to administer Beta Blockers:Not Applicable 

## 2011-04-25 NOTE — Anesthesia Postprocedure Evaluation (Signed)
  Anesthesia Post-op Note  Patient: Nancy Sparks  Procedure(s) Performed: Procedure(s) (LRB): LUMBAR LAMINECTOMY/DECOMPRESSION MICRODISCECTOMY 1 LEVEL (Left)  Patient Location: PACU  Anesthesia Type: General  Level of Consciousness: awake, alert  and oriented  Airway and Oxygen Therapy: Patient Spontanous Breathing and Patient connected to nasal cannula oxygen  Post-op Pain: mild  Post-op Assessment: Post-op Vital signs reviewed and Patient's Cardiovascular Status Stable  Post-op Vital Signs: stable  Complications: No apparent anesthesia complications

## 2011-04-26 ENCOUNTER — Inpatient Hospital Stay: Admit: 2011-04-26 | Payer: Self-pay | Admitting: Neurosurgery

## 2011-04-26 ENCOUNTER — Ambulatory Visit (HOSPITAL_COMMUNITY)
Admission: EM | Admit: 2011-04-26 | Discharge: 2011-04-27 | Disposition: A | Discharge status: Home / Self Care | Payer: BC Managed Care – PPO | Source: Home / Self Care | Attending: Emergency Medicine | Admitting: Emergency Medicine

## 2011-04-26 ENCOUNTER — Encounter (HOSPITAL_COMMUNITY): Payer: Self-pay | Admitting: Anesthesiology

## 2011-04-26 ENCOUNTER — Emergency Department (HOSPITAL_COMMUNITY): Payer: BC Managed Care – PPO

## 2011-04-26 ENCOUNTER — Encounter (HOSPITAL_COMMUNITY)
Admission: EM | Disposition: A | Discharge status: Home / Self Care | Payer: Self-pay | Source: Home / Self Care | Attending: Emergency Medicine

## 2011-04-26 ENCOUNTER — Encounter (HOSPITAL_COMMUNITY): Payer: Self-pay | Admitting: Neurosurgery

## 2011-04-26 ENCOUNTER — Emergency Department (HOSPITAL_COMMUNITY): Payer: BC Managed Care – PPO | Admitting: Anesthesiology

## 2011-04-26 DIAGNOSIS — IMO0002 Reserved for concepts with insufficient information to code with codable children: Secondary | ICD-10-CM | POA: Insufficient documentation

## 2011-04-26 DIAGNOSIS — E785 Hyperlipidemia, unspecified: Secondary | ICD-10-CM | POA: Insufficient documentation

## 2011-04-26 DIAGNOSIS — Y838 Other surgical procedures as the cause of abnormal reaction of the patient, or of later complication, without mention of misadventure at the time of the procedure: Secondary | ICD-10-CM | POA: Insufficient documentation

## 2011-04-26 DIAGNOSIS — Y9210 Unspecified residential institution as the place of occurrence of the external cause: Secondary | ICD-10-CM | POA: Insufficient documentation

## 2011-04-26 DIAGNOSIS — M79609 Pain in unspecified limb: Secondary | ICD-10-CM | POA: Insufficient documentation

## 2011-04-26 DIAGNOSIS — M5430 Sciatica, unspecified side: Secondary | ICD-10-CM

## 2011-04-26 HISTORY — PX: LUMBAR LAMINECTOMY/DECOMPRESSION MICRODISCECTOMY: SHX5026

## 2011-04-26 LAB — CBC
HCT: 36.9 % (ref 36.0–46.0)
MCH: 31.4 pg (ref 26.0–34.0)
MCHC: 33.6 g/dL (ref 30.0–36.0)
RDW: 12.1 % (ref 11.5–15.5)

## 2011-04-26 LAB — BASIC METABOLIC PANEL
BUN: 14 mg/dL (ref 6–23)
Calcium: 8.9 mg/dL (ref 8.4–10.5)
Creatinine, Ser: 0.72 mg/dL (ref 0.50–1.10)
GFR calc Af Amer: >90 mL/min (ref >90)
GFR calc non Af Amer: >90 mL/min (ref >90)
Glucose, Bld: 84 mg/dL (ref 70–99)
Potassium: 3.8 mEq/L (ref 3.5–5.1)

## 2011-04-26 SURGERY — LUMBAR LAMINECTOMY/DECOMPRESSION MICRODISCECTOMY 1 LEVEL
Anesthesia: General | Site: Back

## 2011-04-26 MED ORDER — ROCURONIUM BROMIDE 100 MG/10ML IV SOLN
INTRAVENOUS | Status: DC | PRN
  Administered 2011-04-26: 40 mg via INTRAVENOUS

## 2011-04-26 MED ORDER — BUPIVACAINE HCL 0.25 % IJ SOLN
INTRAMUSCULAR | Status: DC | PRN
  Administered 2011-04-26: 10 mL

## 2011-04-26 MED ORDER — MIDAZOLAM HCL 5 MG/5ML IJ SOLN
INTRAMUSCULAR | Status: DC | PRN
  Administered 2011-04-26: 2 mg via INTRAVENOUS

## 2011-04-26 MED ORDER — ONDANSETRON HCL 4 MG/2ML IJ SOLN
INTRAMUSCULAR | Status: DC | PRN
  Administered 2011-04-26: 4 mg via INTRAVENOUS

## 2011-04-26 MED ORDER — ONDANSETRON HCL 4 MG/2ML IJ SOLN: 4.0000 mg | Freq: Four times a day (QID) | INTRAMUSCULAR | Status: DC | PRN

## 2011-04-26 MED ORDER — PHENOL 1.4 % MT LIQD: 1.0000 | OROMUCOSAL | Status: DC | PRN

## 2011-04-26 MED ORDER — OXYCODONE-ACETAMINOPHEN 5-325 MG PO TABS
1.0000 | ORAL_TABLET | ORAL | Status: DC | PRN
  Administered 2011-04-26 – 2011-04-27 (×3): 2 via ORAL
  Filled 2011-04-26 (×3): qty 2

## 2011-04-26 MED ORDER — HEMOSTATIC AGENTS (NO CHARGE) OPTIME
TOPICAL | Status: DC | PRN
  Administered 2011-04-26: 1 via TOPICAL

## 2011-04-26 MED ORDER — THROMBIN 5000 UNITS EX SOLR
CUTANEOUS | Status: DC | PRN
  Administered 2011-04-26 (×2): 5000 [IU] via TOPICAL

## 2011-04-26 MED ORDER — ONDANSETRON HCL 4 MG/2ML IJ SOLN: 4.0000 mg | INTRAMUSCULAR | Status: DC | PRN

## 2011-04-26 MED ORDER — HYDROMORPHONE HCL PF 1 MG/ML IJ SOLN
1.0000 mg | INTRAMUSCULAR | Status: DC | PRN
  Administered 2011-04-26 – 2011-04-27 (×3): 1 mg via INTRAVENOUS
  Filled 2011-04-26 (×3): qty 1

## 2011-04-26 MED ORDER — ACETAMINOPHEN 325 MG PO TABS: 650.0000 mg | ORAL_TABLET | ORAL | Status: DC | PRN

## 2011-04-26 MED ORDER — OXYCODONE-ACETAMINOPHEN 5-325 MG PO TABS: 1.0000 | ORAL_TABLET | ORAL | Status: DC | PRN

## 2011-04-26 MED ORDER — ZOLPIDEM TARTRATE 5 MG PO TABS
5.0000 mg | ORAL_TABLET | Freq: Every evening | ORAL | Status: DC | PRN
  Administered 2011-04-26: 5 mg via ORAL
  Filled 2011-04-26: qty 1

## 2011-04-26 MED ORDER — FENTANYL CITRATE 0.05 MG/ML IJ SOLN
INTRAMUSCULAR | Status: DC | PRN
  Administered 2011-04-26 (×2): 50 ug via INTRAVENOUS
  Administered 2011-04-26: 150 ug via INTRAVENOUS

## 2011-04-26 MED ORDER — 0.9 % SODIUM CHLORIDE (POUR BTL) OPTIME
TOPICAL | Status: DC | PRN
  Administered 2011-04-26: 1000 mL

## 2011-04-26 MED ORDER — FENTANYL CITRATE 0.05 MG/ML IJ SOLN
25.0000 ug | INTRAMUSCULAR | Status: DC | PRN
  Administered 2011-04-26 (×2): 50 ug via INTRAVENOUS

## 2011-04-26 MED ORDER — ACETAMINOPHEN 650 MG RE SUPP: 650.0000 mg | RECTAL | Status: DC | PRN

## 2011-04-26 MED ORDER — CYCLOBENZAPRINE HCL 10 MG PO TABS
10.0000 mg | ORAL_TABLET | Freq: Three times a day (TID) | ORAL | Status: DC | PRN
  Administered 2011-04-27: 10 mg via ORAL
  Filled 2011-04-26: qty 1

## 2011-04-26 MED ORDER — SODIUM CHLORIDE 0.9 % IR SOLN
Status: DC | PRN
  Administered 2011-04-26: 19:00:00

## 2011-04-26 MED ORDER — SODIUM CHLORIDE 0.9 % IJ SOLN: 3.0000 mL | Freq: Two times a day (BID) | INTRAMUSCULAR | Status: DC

## 2011-04-26 MED ORDER — BUPROPION HCL ER (XL) 150 MG PO TB24
150.0000 mg | ORAL_TABLET | Freq: Every day | ORAL | Status: DC
  Administered 2011-04-26: 150 mg via ORAL
  Filled 2011-04-26 (×2): qty 1

## 2011-04-26 MED ORDER — CEFAZOLIN SODIUM 1-5 GM-% IV SOLN
1.0000 g | Freq: Three times a day (TID) | INTRAVENOUS | Status: DC
  Administered 2011-04-27: 1 g via INTRAVENOUS
  Filled 2011-04-26 (×2): qty 50

## 2011-04-26 MED ORDER — DIPHENHYDRAMINE HCL 25 MG PO CAPS
50.0000 mg | ORAL_CAPSULE | Freq: Every day | ORAL | Status: DC | PRN
  Administered 2011-04-26: 50 mg via ORAL
  Filled 2011-04-26: qty 2

## 2011-04-26 MED ORDER — MENTHOL 3 MG MT LOZG: 1.0000 | LOZENGE | OROMUCOSAL | Status: DC | PRN

## 2011-04-26 MED ORDER — ATORVASTATIN CALCIUM 10 MG PO TABS
20.0000 mg | ORAL_TABLET | Freq: Every day | ORAL | Status: DC
  Filled 2011-04-26: qty 2

## 2011-04-26 MED ORDER — GLYCOPYRROLATE 0.2 MG/ML IJ SOLN
INTRAMUSCULAR | Status: DC | PRN
  Administered 2011-04-26: 0.6 mg via INTRAVENOUS

## 2011-04-26 MED ORDER — THROMBIN 5000 UNITS EX SOLR
OROMUCOSAL | Status: DC | PRN
  Administered 2011-04-26: 19:00:00 via TOPICAL

## 2011-04-26 MED ORDER — NEOSTIGMINE METHYLSULFATE 1 MG/ML IJ SOLN
INTRAMUSCULAR | Status: DC | PRN
  Administered 2011-04-26: 3.5 mg via INTRAVENOUS

## 2011-04-26 MED ORDER — PROPOFOL 10 MG/ML IV EMUL
INTRAVENOUS | Status: DC | PRN
  Administered 2011-04-26: 150 mg via INTRAVENOUS

## 2011-04-26 MED ORDER — DIPHENHYDRAMINE HCL (SLEEP) 25 MG PO TABS: 50.0000 mg | ORAL_TABLET | Freq: Every day | ORAL | Status: DC | PRN

## 2011-04-26 MED ORDER — LAMOTRIGINE 100 MG PO TABS
100.0000 mg | ORAL_TABLET | Freq: Every day | ORAL | Status: DC
  Administered 2011-04-26: 100 mg via ORAL
  Filled 2011-04-26 (×2): qty 1

## 2011-04-26 MED ORDER — LACTATED RINGERS IV SOLN
INTRAVENOUS | Status: DC | PRN
  Administered 2011-04-26 (×2): via INTRAVENOUS

## 2011-04-26 MED ORDER — MORPHINE SULFATE 4 MG/ML IJ SOLN: 2.0000 mg | INTRAMUSCULAR | Status: DC | PRN

## 2011-04-26 SURGICAL SUPPLY — 58 items
BAG DECANTER FOR FLEXI CONT (MISCELLANEOUS) ×2 IMPLANT
BENZOIN TINCTURE PRP APPL 2/3 (GAUZE/BANDAGES/DRESSINGS) ×2 IMPLANT
BLADE SURG 11 STRL SS (BLADE) ×2 IMPLANT
BLADE SURG ROTATE 9660 (MISCELLANEOUS) IMPLANT
BRUSH SCRUB EZ PLAIN DRY (MISCELLANEOUS) ×2 IMPLANT
BUR MATCHSTICK NEURO 3.0 LAGG (BURR) IMPLANT
BUR PRECISION FLUTE 6.0 (BURR) IMPLANT
CANISTER SUCTION 2500CC (MISCELLANEOUS) ×2 IMPLANT
CLOTH BEACON ORANGE TIMEOUT ST (SAFETY) ×2 IMPLANT
CONT SPEC 4OZ CLIKSEAL STRL BL (MISCELLANEOUS) ×2 IMPLANT
DECANTER SPIKE VIAL GLASS SM (MISCELLANEOUS) ×2 IMPLANT
DERMABOND ADVANCED (GAUZE/BANDAGES/DRESSINGS) ×1
DERMABOND ADVANCED .7 DNX12 (GAUZE/BANDAGES/DRESSINGS) ×1 IMPLANT
DRAPE LAPAROTOMY 100X72X124 (DRAPES) ×2 IMPLANT
DRAPE MICROSCOPE LEICA (MISCELLANEOUS) ×2 IMPLANT
DRAPE MICROSCOPE ZEISS OPMI (DRAPES) IMPLANT
DRAPE POUCH INSTRU U-SHP 10X18 (DRAPES) ×2 IMPLANT
DRAPE PROXIMA HALF (DRAPES) IMPLANT
DRAPE SURG 17X23 STRL (DRAPES) ×2 IMPLANT
DRESSING TELFA 8X3 (GAUZE/BANDAGES/DRESSINGS) ×2 IMPLANT
DRSG OPSITE 4X5.5 SM (GAUZE/BANDAGES/DRESSINGS) ×2 IMPLANT
ELECT REM PT RETURN 9FT ADLT (ELECTROSURGICAL) ×2
ELECTRODE REM PT RTRN 9FT ADLT (ELECTROSURGICAL) ×1 IMPLANT
EVACUATOR 1/8 PVC DRAIN (DRAIN) ×2 IMPLANT
GAUZE SPONGE 4X4 16PLY XRAY LF (GAUZE/BANDAGES/DRESSINGS) IMPLANT
GLOVE BIO SURGEON STRL SZ 6.5 (GLOVE) ×2 IMPLANT
GLOVE BIO SURGEON STRL SZ8 (GLOVE) ×2 IMPLANT
GLOVE BIOGEL PI IND STRL 6.5 (GLOVE) ×1 IMPLANT
GLOVE BIOGEL PI IND STRL 7.5 (GLOVE) ×1 IMPLANT
GLOVE BIOGEL PI INDICATOR 6.5 (GLOVE) ×1
GLOVE BIOGEL PI INDICATOR 7.5 (GLOVE) ×1
GLOVE ECLIPSE 7.5 STRL STRAW (GLOVE) ×4 IMPLANT
GLOVE EXAM NITRILE LRG STRL (GLOVE) IMPLANT
GLOVE EXAM NITRILE MD LF STRL (GLOVE) IMPLANT
GLOVE EXAM NITRILE XL STR (GLOVE) IMPLANT
GLOVE EXAM NITRILE XS STR PU (GLOVE) IMPLANT
GLOVE INDICATOR 8.5 STRL (GLOVE) ×2 IMPLANT
GOWN BRE IMP SLV AUR LG STRL (GOWN DISPOSABLE) ×2 IMPLANT
GOWN BRE IMP SLV AUR XL STRL (GOWN DISPOSABLE) ×4 IMPLANT
GOWN STRL REIN 2XL LVL4 (GOWN DISPOSABLE) ×2 IMPLANT
KIT BASIN OR (CUSTOM PROCEDURE TRAY) ×2 IMPLANT
KIT ROOM TURNOVER OR (KITS) ×2 IMPLANT
NEEDLE HYPO 22GX1.5 SAFETY (NEEDLE) ×2 IMPLANT
NEEDLE SPNL 22GX3.5 QUINCKE BK (NEEDLE) IMPLANT
NS IRRIG 1000ML POUR BTL (IV SOLUTION) ×2 IMPLANT
PACK LAMINECTOMY NEURO (CUSTOM PROCEDURE TRAY) ×2 IMPLANT
RUBBERBAND STERILE (MISCELLANEOUS) ×4 IMPLANT
SPONGE GAUZE 4X4 12PLY (GAUZE/BANDAGES/DRESSINGS) ×2 IMPLANT
SPONGE SURGIFOAM ABS GEL SZ50 (HEMOSTASIS) ×2 IMPLANT
STRIP CLOSURE SKIN 1/2X4 (GAUZE/BANDAGES/DRESSINGS) ×2 IMPLANT
SUT VIC AB 0 CT1 18XCR BRD8 (SUTURE) ×1 IMPLANT
SUT VIC AB 0 CT1 8-18 (SUTURE) ×1
SUT VIC AB 2-0 CT1 18 (SUTURE) ×2 IMPLANT
SUT VICRYL 4-0 PS2 18IN ABS (SUTURE) ×2 IMPLANT
SYR 20ML ECCENTRIC (SYRINGE) ×2 IMPLANT
TOWEL OR 17X24 6PK STRL BLUE (TOWEL DISPOSABLE) ×2 IMPLANT
TOWEL OR 17X26 10 PK STRL BLUE (TOWEL DISPOSABLE) ×2 IMPLANT
WATER STERILE IRR 1000ML POUR (IV SOLUTION) ×2 IMPLANT

## 2011-04-26 NOTE — ED Notes (Signed)
Spoke with OR re: MRI results, awaiting return call, Dr. Wynetta Emery viewing MRI results currently

## 2011-04-26 NOTE — Discharge Summary (Signed)
  Physician Discharge Summary  Patient ID: Nancy Sparks MRN: [id number] DOB/AGE: 47-Jul-1967 47 y.o.  Admit date: 04/25/2011 Discharge date: 04/26/2011  Admission Diagnoses: Herniated nucleus pulposus L3-4  Discharge Diagnoses: Same Active Problems:  * No active hospital problems. *    Discharged Condition: good  Hospital Course: Patient was admitted to the hospital underwent an L3-4 laminectomy discectomy postoperatively patient did very well with recovered in the floor on the floor patient was convalescing well and living and voiding spontaneously by postop day 1 was still be discharged home scheduled followup approximately one week.  Consults: Significant Diagnostic Studies: Treatments: Left-sided L3-4 laminectomy and discectomy Discharge Exam: Blood pressure 102/52, pulse 70, temperature 97.8 F (36.6 C), temperature source Oral, resp. rate 14, height 5\' 6"  (1.676 m), weight 68.04 kg (150 lb), SpO2 98.00%. Strength 5 out of 5 wound clean and dry.  Disposition: Home  Discharge Orders    Future Appointments: Provider: Department: Dept Phone: Center:   05/14/2011 8:30 AM Vesta Mixer, MD [facility] [phone] None     Medication List  As of 04/26/2011  7:28 AM   TAKE these medications         buPROPion 150 MG 24 hr tablet   Commonly known as: WELLBUTRIN XL   Take 150 mg by mouth daily.      lamoTRIgine 100 MG tablet   Commonly known as: LAMICTAL   Take 100 mg by mouth daily.      oxyCODONE-acetaminophen 5-325 MG per tablet   Commonly known as: PERCOCET   Take 1-2 tablets by mouth every 4 (four) hours as needed.      rosuvastatin 10 MG tablet   Commonly known as: CRESTOR   Take 10 mg by mouth daily.             Signed: Bodey Frizell P 04/26/2011, 7:28 AM

## 2011-04-26 NOTE — Discharge Instructions (Signed)
No lifting no bending no twisting no driving a riding a car less discomfort for the doctor's office.

## 2011-04-26 NOTE — Progress Notes (Signed)
Subjective: Patient reports She's doing very well no leg pain manageable back pain  Objective: Vital signs in last 24 hours: Temp:  [97.5 F (36.4 C)-98.7 F (37.1 C)] 97.8 F (36.6 C) (03/22 0350) Pulse Rate:  [63-91] 70  (03/22 0350) Resp:  [10-22] 14  (03/22 0350) BP: (102-145)/(52-80) 102/52 mmHg (03/22 0350) SpO2:  [93 %-100 %] 98 % (03/22 0350)  Intake/Output from previous day: 03/21 0701 - 03/22 0700 In: 1500 [I.V.:1500] Out: 750 [Urine:750] Intake/Output this shift:    Strength 5 out of 5 wound clean and dry.  Lab Results: No results found for this basename: WBC:2,HGB:2,HCT:2,PLT:2 in the last 72 hours BMET No results found for this basename: NA:2,K:2,CL:2,CO2:2,GLUCOSE:2,BUN:2,CREATININE:2,CALCIUM:2 in the last 72 hours  Studies/Results: Dg Lumbar Spine 2-3 Views  04/25/2011  *RADIOLOGY REPORT*  Clinical Data: L3-4 laminectomy.  LUMBAR SPINE - 2-3 VIEW  Comparison: MRI of the lumbar spine 04/21/2001.  Findings: Three intraoperative films are submitted.  The first film demonstrates no surgical hardware.  Film #2 is labeled 06:25 p.m.  A surgical probe is directed at the L3-4 disc space.  Film #3 is labeled 06:35 p.m.  A surgical probe is directed at the L3-4 disc space.  IMPRESSION: Intraoperative localization of the L3-4 disc space as above.  Original Report Authenticated By: Jamesetta Orleans. MATTERN, M.D.    Assessment/Plan: Posterior day 1 from a laminectomy and discectomy doing very well we'll send a stable for discharge home  LOS: 1 day     Nancy Sparks P 04/26/2011, 7:25 AM

## 2011-04-26 NOTE — ED Provider Notes (Addendum)
History     CSN: [id number]  Arrival date & time 04/26/11  1448   First MD Initiated Contact with Patient 04/26/11 1510      Chief Complaint  Patient presents with  . Back Pain    (Consider location/radiation/quality/duration/timing/severity/associated sxs/prior treatment) The history is provided by the patient.   patient is a 46 year old female status post laminectomy by neurosurgery yesterday today with increased pain in the left leg told by neurosurgery come here for MRI. The pain is 10 out of 10 radiates into left leg no incontinence no sensory or motor deficit in either lower trimming.  Past Medical History  Diagnosis Date  . Dyslipidemia   . Fibroid   . Heart palpitations     hx of  . Anxiety   . Depression   . Complication of anesthesia     itching relieved with benadryl     Past Surgical History  Procedure Date  . Cesarean section     X3  . Tubal ligation   . Novasure ablation 2006  . Colonoscopy   . Lumbar laminectomy/decompression microdiscectomy 04/25/2011    Procedure: LUMBAR LAMINECTOMY/DECOMPRESSION MICRODISCECTOMY 1 LEVEL;  Surgeon: Mariam Dollar, MD;  Location: [facility];  Service: Neurosurgery;  Laterality: Left;  Left Lumbar Three-Four Laminectomy and Diskectomy    Family History  Problem Relation Age of Onset  . Hyperlipidemia Mother   . Hypertension Mother   . Anesthesia problems Mother   . Coronary artery disease Father     CABGx3 age 40  . Hyperlipidemia Father   . Atrial fibrillation Father   . Hypertension Father   . Heart disease Father   . Diabetes Father   . Hyperlipidemia Brother     x2  . Hyperlipidemia Sister     X80  . Cancer Sister     colon cancer  . Thyroid disease Sister   . Anesthesia problems Sister     History  Substance Use Topics  . Smoking status: Never Smoker   . Smokeless tobacco: Not on file   Comment: quit smoking in college  . Alcohol Use: 0.0 oz/week     drinks wine or mixed drinks socially    OB  History    Grav Para Term Preterm Abortions TAB SAB Ect Mult Living   4 3 3  1     3       Review of Systems  Constitutional: Positive for fever.  HENT: Negative for neck pain and neck stiffness.   Eyes: Negative for visual disturbance.  Respiratory: Negative for shortness of breath.   Cardiovascular: Negative for chest pain.  Gastrointestinal: Negative for nausea, vomiting and abdominal pain.  Genitourinary: Negative for dysuria.  Musculoskeletal: Positive for back pain.  Skin: Negative for rash.  Neurological: Negative for weakness, numbness and headaches.  Hematological: Does not bruise/bleed easily.    Allergies  Tape  Home Medications   Current Outpatient Rx  Name Route Sig Dispense Refill  . BUPROPION HCL ER (XL) 150 MG PO TB24 Oral Take 150 mg by mouth daily.    Marland Kitchen DIPHENHYDRAMINE HCL (SLEEP) 25 MG PO TABS Oral Take 50 mg by mouth daily as needed. itching    . LAMOTRIGINE 100 MG PO TABS Oral Take 100 mg by mouth daily.     . OXYCODONE-ACETAMINOPHEN 5-325 MG PO TABS Oral Take 1-2 tablets by mouth every 4 (four) hours as needed. For pain    . ROSUVASTATIN CALCIUM 10 MG PO TABS Oral Take 10 mg by  mouth daily.      BP 126/74  Pulse 87  Temp(Src) 100.3 F (37.9 C) (Oral)  Resp 19  SpO2 96%  Physical Exam  Nursing note and vitals reviewed. Constitutional: She is oriented to person, place, and time. She appears well-developed and well-nourished. No distress.  HENT:  Head: Normocephalic and atraumatic.  Mouth/Throat: Oropharynx is clear and moist.  Eyes: Conjunctivae and EOM are normal. Pupils are equal, round, and reactive to light.  Neck: Normal range of motion. Neck supple.  Cardiovascular: Normal rate, normal heart sounds and intact distal pulses.   No murmur heard. Pulmonary/Chest: Effort normal and breath sounds normal. No respiratory distress.  Abdominal: Soft. Bowel sounds are normal. There is no tenderness.  Musculoskeletal: Normal range of motion. She  exhibits no edema.       Except for pain around the laminectomy site some tenderness paraspinous area. Left lower extremity without neuro or motor deficit.  Neurological: She is alert and oriented to person, place, and time. No cranial nerve deficit. She exhibits normal muscle tone. Coordination normal.  Skin: Skin is warm. No rash noted.    ED Course  Procedures (including critical care time)  Labs Reviewed - No data to display Dg Lumbar Spine 2-3 Views  04/25/2011  *RADIOLOGY REPORT*  Clinical Data: L3-4 laminectomy.  LUMBAR SPINE - 2-3 VIEW  Comparison: MRI of the lumbar spine 04/21/2001.  Findings: Three intraoperative films are submitted.  The first film demonstrates no surgical hardware.  Film #2 is labeled 06:25 p.m.  A surgical probe is directed at the L3-4 disc space.  Film #3 is labeled 06:35 p.m.  A surgical probe is directed at the L3-4 disc space.  IMPRESSION: Intraoperative localization of the L3-4 disc space as above.  Original Report Authenticated By: Jamesetta Orleans. MATTERN, M.D.     1. Sciatica       MDM   Patient seen by neurosurgery Dr. Wynetta Emery he will admit once MRI of the back may very well be taking patient back to the operating room this evening.  As noted in the history of present illness patient status post a L3-L4 laminectomy yesterday patient has increased pain into her left leg since the procedure. And low-grade fever at 100.3. Baseline labs are ordered. Patient given pain medicine Dilaudid 1 mg IV push.       Shelda Jakes, MD 04/26/11 1718  Shelda Jakes, MD 04/27/11 1150

## 2011-04-26 NOTE — ED Notes (Signed)
Spoke with Nancy Sparks with MRI re: Lumbar MRI without contrast, informed that availability for procedure is  estimated to be an hour, pt updated on plan of care

## 2011-04-26 NOTE — Anesthesia Preprocedure Evaluation (Addendum)
Anesthesia Evaluation  Patient identified by MRN, date of birth, ID band Patient awake    Reviewed: Allergy & Precautions, H&P , NPO status , Patient's Chart, lab work & pertinent test results  Airway Mallampati: II  Neck ROM: full    Dental   Pulmonary          Cardiovascular     Neuro/Psych PSYCHIATRIC DISORDERS Anxiety Depression    GI/Hepatic   Endo/Other    Renal/GU      Musculoskeletal   Abdominal   Peds  Hematology   Anesthesia Other Findings   Reproductive/Obstetrics                           Anesthesia Physical Anesthesia Plan  ASA: II  Anesthesia Plan: General   Post-op Pain Management:    Induction: Intravenous  Airway Management Planned: Oral ETT  Additional Equipment:   Intra-op Plan:   Post-operative Plan: Extubation in OR  Informed Consent: I have reviewed the patients History and Physical, chart, labs and discussed the procedure including the risks, benefits and alternatives for the proposed anesthesia with the patient or authorized representative who has indicated his/her understanding and acceptance.     Plan Discussed with: CRNA and Surgeon  Anesthesia Plan Comments:         Anesthesia Quick Evaluation

## 2011-04-26 NOTE — Transfer of Care (Signed)
Immediate Anesthesia Transfer of Care Note  Patient: Nancy Sparks  Procedure(s) Performed: Procedure(s) (LRB): LUMBAR LAMINECTOMY/DECOMPRESSION MICRODISCECTOMY 1 LEVEL (N/A)  Patient Location: PACU  Anesthesia Type: General  Level of Consciousness: awake  Airway & Oxygen Therapy: Patient Spontanous Breathing  Post-op Assessment: Report given to PACU RN  Post vital signs: stable  Complications: No apparent anesthesia complications

## 2011-04-26 NOTE — ED Notes (Signed)
Pre approved insurance info for MRI [phone]

## 2011-04-26 NOTE — ED Notes (Signed)
Pt arrived by [facility] for back pain, had lower back surgery yesterday and was dc home this am. Was fine with ambulation until approx 1400, pt then had shooting pain down side of left leg and unable to move left leg, told to come here for MRI.

## 2011-04-26 NOTE — ED Notes (Signed)
Per Dr. Wynetta Emery the pt is to have a Lumbar MRI without contrast once the pt has completed the MRI the pt is to go to the OR, Informed Consent to be completed In the OR (call OR @ [phone]) once the MRI is completed, pt to be NPO  At this time

## 2011-04-26 NOTE — Anesthesia Procedure Notes (Signed)
Procedure Name: Intubation Date/Time: 04/26/2011 6:47 PM Performed by: Ellin Goodie Pre-anesthesia Checklist: Patient identified, Emergency Drugs available, Suction available, Patient being monitored and Timeout performed Patient Re-evaluated:Patient Re-evaluated prior to inductionOxygen Delivery Method: Circle system utilized Preoxygenation: Pre-oxygenation with 100% oxygen Intubation Type: IV induction Ventilation: Mask ventilation without difficulty Laryngoscope Size: Mac and 3 Grade View: Grade I Tube size: 7.5 mm Number of attempts: 1 Airway Equipment and Method: Stylet Placement Confirmation: ETT inserted through vocal cords under direct vision,  positive ETCO2 and breath sounds checked- equal and bilateral Secured at: 22 cm Tube secured with: Tape Dental Injury: Teeth and Oropharynx as per pre-operative assessment

## 2011-04-26 NOTE — Op Note (Signed)
Preoperative diagnosis: Lumbar epidural hematoma L3-4  Postoperative diagnosis: Same  Procedure: Expiration of lumbar wound for evacuation of lumbar epidural hematoma  Surgeon: Jillyn Hidden Curlie Sittner  Anesthesia: Gen.  EBL: Minimal  History of present illness: Patient reports initial female who last night underwent an L3-4 laminectomy and discectomy and left patient initially did very well however approximately day today start space a recurrent worsening back pain left hip and leg pain and an L4 distribution the patient emergently brought to the ER underwent MRI scan which showed an epidural hematoma and patient was emergently taken the or for evacuation.  Operative procedure: Patient brought in the or was induced and general anesthesia and positioned prone the Wilson frame her back was prepped and draped in routine sterile fashion the her old incision was opened up immediately underneath her skin and subcutaneous tissues was a fairly large organized blood clot at those extrafascial and subcutaneous this is all evacuated and the fascial sutures were cut and there was oozing basically from all the subcutaneous tissues all the fascial sutures the muscle as well as about primus from everywhere the retractor was positioned on the bone bleeding was waxed some bleeding veins are coagulated the operating microscope was draped on the field in [place]: Illumination the disc space was inspected and no new disc herniations were appreciated the L4 foramen was also palpated with a coronary.anoxic min widely patent as well as the undersurface of the thecal sac both medially and septal caudally to surgical was used to help maintain hemostasis but there was a tremendous amount of blood under pressure in the epidural space medially upon opening. After meticulous hemostasis was again achieved with Surgifoam irrigation and bipolar cautery and a medium Hemovac drain was placed and the wounds closed with interrupted Vicryl and a running  4 subcuticular benzo and Steri-Strips were applied patient recovered in stable condition. At the end of case all needle counts and sponge counts were correct.

## 2011-04-26 NOTE — Progress Notes (Signed)
Dc instr given with voiced understanding to front door via w/c accompanied by family

## 2011-04-26 NOTE — Anesthesia Postprocedure Evaluation (Signed)
Anesthesia Post Note  Patient: Nancy Sparks  Procedure(s) Performed: Procedure(s) (LRB): LUMBAR LAMINECTOMY/DECOMPRESSION MICRODISCECTOMY 1 LEVEL (N/A)  Anesthesia type: General  Patient location: PACU  Post pain: Pain level controlled and Adequate analgesia  Post assessment: Post-op Vital signs reviewed, Patient's Cardiovascular Status Stable, Respiratory Function Stable, Patent Airway and Pain level controlled  Last Vitals:  Filed Vitals:   04/26/11 2020  BP:   Pulse: 92  Temp:   Resp: 13    Post vital signs: Reviewed and stable  Level of consciousness: awake, alert  and oriented  Complications: No apparent anesthesia complications

## 2011-04-26 NOTE — H&P (Signed)
Nancy Sparks is an 46 y.o. female.   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 Dyslipidemia   . Fibroid   . Heart palpitations     hx of  . Anxiety   . Depression   . Complication of anesthesia     itching relieved with benadryl     Past Surgical History  Procedure Date  . Cesarean section     X3  . Tubal ligation   . Novasure ablation 2006  . Colonoscopy   . Lumbar laminectomy/decompression microdiscectomy 04/25/2011    Procedure: LUMBAR LAMINECTOMY/DECOMPRESSION MICRODISCECTOMY 1 LEVEL;  Surgeon: Mariam Dollar, MD;  Location: [facility];  Service: Neurosurgery;  Laterality: Left;  Left Lumbar Three-Four Laminectomy and Diskectomy    Family History  Problem Relation Age of Onset  . Hyperlipidemia Mother   . Hypertension Mother   . Anesthesia problems Mother   . Coronary artery disease Father     CABGx3 age 77  . Hyperlipidemia Father   . Atrial fibrillation Father   . Hypertension Father   . Heart disease Father   . Diabetes Father   . Hyperlipidemia Brother     x2  . Hyperlipidemia Sister     X84  . Cancer Sister     colon cancer  . Thyroid disease Sister   . Anesthesia problems Sister    Social History:  reports that she has never smoked. She does not have any smokeless tobacco history on file. She reports that she drinks alcohol. She reports that she does not use illicit drugs.  Allergies:  Allergies  Allergen Reactions  . Tape Rash    Paper tape ok    Medications Prior to Admission  Medication Dose Route  Frequency Provider Last Rate Last Dose  . ceFAZolin (ANCEF) IVPB 1 g/50 mL premix  1 g Intravenous Q8H Mariam Dollar, MD   1 g at 04/26/11 0830  . HYDROmorphone (DILAUDID) injection 1 mg  1 mg Intravenous Q2H PRN Mariam Dollar, MD   1 mg at 04/26/11 1617  . mupirocin ointment (BACTROBAN) 2 %           . DISCONTD: 0.9 % irrigation (POUR BTL)    PRN Mariam Dollar, MD   1,000 mL at 04/25/11 1820  . DISCONTD: acetaminophen (TYLENOL) suppository 650 mg  650 mg Rectal Q4H PRN Mariam Dollar, MD      . DISCONTD: acetaminophen (TYLENOL) tablet 650 mg  650 mg Oral Q4H PRN Mariam Dollar, MD      . DISCONTD: alum & mag hydroxide-simeth (MAALOX/MYLANTA) 200-200-20 MG/5ML suspension 30 mL  30 mL Oral Q6H PRN Mariam Dollar, MD      . DISCONTD: atorvastatin (LIPITOR) tablet 10 mg  10 mg Oral q1800 Mariam Dollar, MD   10 mg at 04/25/11 2208  . DISCONTD: bacitracin 50,000 Units in sodium chloride irrigation 0.9 % 500 mL irrigation    PRN Mariam Dollar, MD      . DISCONTD: bupivacaine (MARCAINE) 0.25 %  injection    PRN Mariam Dollar, MD   10 mL at 04/25/11 1936  . DISCONTD: buPROPion (WELLBUTRIN XL) 24 hr tablet 150 mg  150 mg Oral Daily Mariam Dollar, MD      . DISCONTD: ceFAZolin (ANCEF) IVPB 1 g/50 mL premix    PRN Patrick North, CRNA   1 g at 04/25/11 1749  . DISCONTD: cyclobenzaprine (FLEXERIL) tablet 10 mg  10 mg Oral TID PRN Mariam Dollar, MD   10 mg at 04/25/11 2207  . DISCONTD: diphenhydrAMINE (BENADRYL) capsule 25 mg  25 mg Oral Q6H PRN Mariam Dollar, MD   25 mg at 04/25/11 2208  . DISCONTD: ePHEDrine injection    PRN Patrick North, CRNA   5 mg at 04/25/11 1849  . DISCONTD: glycopyrrolate (ROBINUL) injection    PRN Ellin Goodie, CRNA   0.6 mg at 04/25/11 1937  . DISCONTD: hemostatic agents    PRN Mariam Dollar, MD   1 application at 04/25/11 1820  . DISCONTD: HYDROmorphone (DILAUDID) injection 0.25-0.5 mg  0.25-0.5 mg Intravenous Q5 min PRN Raiford Simmonds, MD   0.5 mg at 04/25/11 2045  . DISCONTD: HYDROmorphone (DILAUDID)  injection 0.5-1 mg  0.5-1 mg Intravenous Q2H PRN Mariam Dollar, MD      . DISCONTD: ketorolac (TORADOL) injection    PRN Ellin Goodie, CRNA   30 mg at 04/25/11 1929  . DISCONTD: lactated ringers infusion    Continuous PRN Patrick North, CRNA      . DISCONTD: lamoTRIgine (LAMICTAL) tablet 100 mg  100 mg Oral Daily Mariam Dollar, MD   100 mg at 04/25/11 2208  . DISCONTD: lidocaine (cardiac) 100 mg/52ml (XYLOCAINE) 20 MG/ML injection 2%    PRN Patrick North, CRNA   50 mg at 04/25/11 1756  . DISCONTD: lidocaine-EPINEPHrine (XYLOCAINE W/EPI) 1 %-1:100000 (with pres) injection    PRN Mariam Dollar, MD   10 mL at 04/25/11 1820  . DISCONTD: menthol-cetylpyridinium (CEPACOL) lozenge 3 mg  1 lozenge Oral PRN Mariam Dollar, MD      . DISCONTD: midazolam (VERSED) 5 MG/5ML injection    PRN Patrick North, CRNA   2 mg at 04/25/11 1749  . DISCONTD: mupirocin ointment (BACTROBAN) 2 %   Nasal BID Mariam Dollar, MD      . DISCONTD: neostigmine (PROSTIGMINE) injection   Intravenous PRN Ellin Goodie, CRNA   4 mg at 04/25/11 1937  . DISCONTD: ondansetron (ZOFRAN) injection 4 mg  4 mg Intravenous Q6H PRN Raiford Simmonds, MD      . DISCONTD: ondansetron (ZOFRAN) injection 4 mg  4 mg Intravenous Q4H PRN Mariam Dollar, MD      . DISCONTD: ondansetron [facility]) injection    PRN Ellin Goodie, CRNA   4 mg at 04/25/11 1824  . DISCONTD: oxyCODONE-acetaminophen (PERCOCET) 5-325 MG per tablet 1-2 tablet  1-2 tablet Oral Q4H PRN Mariam Dollar, MD   2 tablet at 04/26/11 0830  . DISCONTD: phenol (CHLORASEPTIC) mouth spray 1 spray  1 spray Mouth/Throat PRN Mariam Dollar, MD      . DISCONTD: propofol (DIPRIVAN) 10 MG/ML infusion    PRN Patrick North, CRNA   180 mg at 04/25/11 1756  . DISCONTD: rocuronium (ZEMURON) injection    PRN Patrick North, CRNA   20 mg at 04/25/11 1850  . DISCONTD: sodium chloride 0.9 % injection 3 mL  3 mL Intravenous  Q12H Mariam Dollar, MD   3 mL at 04/25/11 2209  . DISCONTD: SUFentanil (SUFENTA) injection    PRN  Patrick North, CRNA   10 mcg at 04/25/11 1800  . DISCONTD: thrombin spray    PRN Mariam Dollar, MD   5,000 Units at 04/25/11 1820  . DISCONTD: zolpidem (AMBIEN) tablet 10 mg  10 mg Oral QHS PRN Clydene Fake, MD   10 mg at 04/25/11 2350   Medications Prior to Admission  Medication Sig Dispense Refill  . buPROPion (WELLBUTRIN XL) 150 MG 24 hr tablet Take 150 mg by mouth daily.      Marland Kitchen lamoTRIgine (LAMICTAL) 100 MG tablet Take 100 mg by mouth daily.       . rosuvastatin (CRESTOR) 10 MG tablet Take 10 mg by mouth daily.        Results for orders placed during the hospital encounter of 04/25/11 (from the past 48 hour(s))  SURGICAL PCR SCREEN     Status: Abnormal   Collection Time   04/25/11  3:19 PM      Component Value Range Comment   MRSA, PCR NEGATIVE  NEGATIVE     Staphylococcus aureus POSITIVE (*) NEGATIVE     Dg Lumbar Spine 2-3 Views  04/25/2011  *RADIOLOGY REPORT*  Clinical Data: L3-4 laminectomy.  LUMBAR SPINE - 2-3 VIEW  Comparison: MRI of the lumbar spine 04/21/2001.  Findings: Three intraoperative films are submitted.  The first film demonstrates no surgical hardware.  Film #2 is labeled 06:25 p.m.  A surgical probe is directed at the L3-4 disc space.  Film #3 is labeled 06:35 p.m.  A surgical probe is directed at the L3-4 disc space.  IMPRESSION: Intraoperative localization of the L3-4 disc space as above.  Original Report Authenticated By: Jamesetta Orleans. MATTERN, M.D.   Mr Lumbar Spine Wo Contrast  04/26/2011  *RADIOLOGY REPORT*  Clinical Data: Surgery yesterday.  Pain right leg.   Unable to void.  MRI LUMBAR SPINE WITHOUT CONTRAST  Technique:  Multiplanar and multiecho pulse sequences of the lumbar spine were obtained without intravenous contrast.  Comparison: 04/25/2011 intraoperative plain film exam.  04/22/2011 MR.  Findings: Slightly transitional appearance was previously labeled L5.  The present examination incorporates from the T10-11 disc space through the mid sacrum.  The  urinary bladder is full.  Uterine fibroids measure up to 3 cm.  Scoliosis.  Conus T12-L1 level.  Recent left-sided L3-4 hemilaminectomy.  At the laminectomy site, there is a postoperative fluid collection which wraps around the left aspect of the thecal sac measuring 1.7 x 0.6 x 1.7 cm.  This causes impression upon the left posterior lateral aspect of the thecal sac.  This extends to the mid to lower L4 level.  This is suggestive of a small postoperative fluid collection/hemorrhage.  At the L3-4 level, previously noted disc protrusion and epidural blood have been resected.  The underlying disc is degenerated with bulge which when combined with short pedicles and facet joint degenerative changes contributes to moderate spinal stenosis.  Mild bilateral foraminal narrowing.  Edema within the paraspinal musculature and subcutaneous region. Largest complex fluid collection posterior to the L3 spinous process measures 2.5 x 1.1 x 0.7 cm.  T10-11:  Schmorl's node deformity.  T11-12:  Small Schmorl's node deformity.  T12-L1:  Minimal Schmorl's node deformity.  L1-2:  Minimal bulge and rotation.  L2-3:  Bulge and rotation slightly greater to the left.  Facet joint degenerative changes.  Mild spinal stenosis greater  on the left.  Mild left lateral recess stenosis.  L3-4:  As above.  L4-5: Broad-based disc osteophyte complex. Right lateral extension with compression of the exiting right L4 nerve root.  Centrally extension slightly greater left posterior lateral position with moderate left-sided lateral recess stenosis.  Facet joint degenerative changes and ligamentum flavum hypertrophy greater on the right.  Multifactorial moderate spinal stenosis.  L5-S1:  Rudimentary appearance of the disc without significant spinal stenosis or foraminal narrowing.  IMPRESSION: Postoperative changes on the left at the L3-4 level with postoperative fluid collection as detailed above.  L3-4 and L4-5 multifactorial moderate spinal stenosis.   Moderate left L4-5 lateral recess stenosis.  Right lateral extension of L4-5 disc osteophyte with compression of the exiting right L4 nerve root.  Please see above for further detail.  Original Report Authenticated By: Fuller Canada, M.D.    Review of Systems  Constitutional: Negative.   HENT: Negative.   Eyes: Negative.   Respiratory: Negative.   Cardiovascular: Negative.   Gastrointestinal: Negative.   Genitourinary: Negative.   Musculoskeletal: Positive for back pain.  Skin: Negative.   Neurological: Positive for tremors.    Blood pressure 126/74, pulse 87, temperature 100.3 F (37.9 C), temperature source Oral, resp. rate 19, SpO2 96.00%. Physical Exam  Constitutional: She is oriented to person, place, and time. She appears well-developed and well-nourished.  HENT:  Head: Normocephalic and atraumatic.  Eyes: Pupils are equal, round, and reactive to light.  Neck: Normal range of motion.  Cardiovascular: Normal rate and regular rhythm.   Respiratory: Effort normal and breath sounds normal.  GI: Soft. Bowel sounds are normal.  Neurological: She is alert and oriented to person, place, and time.     Assessment/Plan 3 structural female who is postop day 1 from a laminectomy discectomy who presents with recurrent left leg radiculopathy has revealed a epidural hematoma and patient will be taken the or MRSA for evacuation of epidural hematoma extension of the wrist there is a depression with her as well as the perioperative course and expectations of outcome and alternatives of surgery she understands and agrees to proceed forward.  Konnor Vondrasek P 04/26/2011, 6:11 PM

## 2011-04-26 NOTE — ED Notes (Signed)
3013-01 Ready 

## 2011-04-27 NOTE — Discharge Instructions (Addendum)
Follow up as previously ordered. Call if you have problems with pain.

## 2011-04-27 NOTE — Discharge Summary (Signed)
BP 101/48  Pulse 84  Temp(Src) 98.3 F (36.8 C) (Oral)  Resp 18  SpO2 99% Alert and oriented x 4. Speech clear and fluent. Wound clean and dry.  Moving lower extremities well.  Will D/c home. [id number] HNP L4/5 DDx Wound hematoma DEst home Meds none Proc exploration of wound, hematoma evacuation.

## 2011-04-29 ENCOUNTER — Encounter (HOSPITAL_COMMUNITY): Payer: Self-pay | Admitting: Neurosurgery

## 2011-04-29 MED FILL — Sodium Chloride Irrigation Soln 0.9%: Qty: 500 | Status: AC

## 2011-04-30 NOTE — Progress Notes (Signed)
Utilization review completed. Ramata Strothman, RN, BSN. 04/30/11  

## 2011-05-14 ENCOUNTER — Encounter: Payer: Self-pay | Admitting: Cardiovascular Disease

## 2011-05-14 ENCOUNTER — Ambulatory Visit (INDEPENDENT_AMBULATORY_CARE_PROVIDER_SITE_OTHER): Payer: BC Managed Care – PPO | Admitting: Cardiovascular Disease

## 2011-05-14 VITALS — BP 125/80 | HR 63 | Ht 63.0 in | Wt 152.0 lb

## 2011-05-14 DIAGNOSIS — R002 Palpitations: Secondary | ICD-10-CM

## 2011-05-14 DIAGNOSIS — E785 Hyperlipidemia, unspecified: Secondary | ICD-10-CM

## 2011-05-14 LAB — BASIC METABOLIC PANEL
BUN: 15 mg/dL (ref 6–23)
Calcium: 9.2 mg/dL (ref 8.4–10.5)
GFR: 92.53 mL/min (ref >60.00)
Glucose, Bld: 84 mg/dL (ref 70–99)

## 2011-05-14 LAB — LIPID PANEL
Cholesterol: 237 mg/dL — ABNORMAL HIGH (ref 0–200)
HDL: 75.4 mg/dL (ref >39.00)
VLDL: 9.4 mg/dL (ref 0.0–40.0)

## 2011-05-14 LAB — HEPATIC FUNCTION PANEL
AST: 19 U/L (ref 0–37)
Albumin: 4.3 g/dL (ref 3.5–5.2)

## 2011-05-14 MED ORDER — ROSUVASTATIN CALCIUM 10 MG PO TABS: 10.0000 mg | ORAL_TABLET | Freq: Every day | ORAL | Status: DC

## 2011-05-14 NOTE — Assessment & Plan Note (Signed)
Check her fasting labs today. I'll see her again in one year for a followup office visit antacid meds. We will refill her Crestor 10 mg tablets.

## 2011-05-14 NOTE — Patient Instructions (Signed)
Your physician wants you to follow-up in: 1 year  You will receive a reminder letter in the mail two months in advance. If you don't receive a letter, please call our office to schedule the follow-up appointment.  Your physician recommends that you return for a FASTING lipid profile: today   

## 2011-05-14 NOTE — Progress Notes (Signed)
Nancy Sparks Date of Birth  1966/01/09 [facility]     [place]  [address]    [address]   [address] [place], [place]  [address]    [place], [place]  [address] [phone]  Fax  [phone]  [phone]  Fax [phone]  Problems: 1. Dyslipidemia 2. Palpitations 3. Indigestion  History of Present Illness:  Pt is doing well.  She had a laminectomy several weeks ago.  She is recovering well.  She is disappointed that she will miss her tennis season this year.    Current Outpatient Prescriptions on File Prior to Visit  Medication Sig Dispense Refill  . buPROPion (WELLBUTRIN XL) 150 MG 24 hr tablet Take 150 mg by mouth daily.      . diphenhydrAMINE (SOMINEX) 25 MG tablet Take 50 mg by mouth daily as needed. itching      . lamoTRIgine (LAMICTAL) 100 MG tablet Take 100 mg by mouth daily.       . rosuvastatin (CRESTOR) 10 MG tablet Take 10 mg by mouth daily.      Marland Kitchen DISCONTD: calcium carbonate (OS-CAL) 600 MG TABS Take 600 mg by mouth 2 (two) times daily with meals.        Marland Kitchen DISCONTD: DULoxetine (CYMBALTA) 30 MG capsule Take 30 mg by mouth daily.         Allergies  Allergen Reactions  . Latex   . Tape Rash    Paper tape ok    Past Medical History  Diagnosis Date  . Dyslipidemia   . Fibroid   . Heart palpitations     hx of  . Anxiety   . Depression   . Complication of anesthesia     itching relieved with benadryl     Past Surgical History  Procedure Date  . Cesarean section     X3  . Tubal ligation   . Novasure ablation 2006  . Colonoscopy   . Lumbar laminectomy/decompression microdiscectomy 04/25/2011    Procedure: LUMBAR LAMINECTOMY/DECOMPRESSION MICRODISCECTOMY 1 LEVEL;  Surgeon: Mariam Dollar, MD;  Location: [facility];  Service: Neurosurgery;  Laterality: Left;  Left Lumbar Three-Four Laminectomy and Diskectomy  . Lumbar laminectomy/decompression microdiscectomy 04/26/2011    Procedure: LUMBAR LAMINECTOMY/DECOMPRESSION  MICRODISCECTOMY 1 LEVEL;  Surgeon: Mariam Dollar, MD;  Location: [facility];  Service: Neurosurgery;  Laterality: N/A;  Re-Exploration of Lumbar Wound and Evacuation of Epidural Hematoma    History  Smoking status  . Never Smoker   Smokeless tobacco  . Not on file  Comment: quit smoking in college    History  Alcohol Use  . 0.0 oz/week    drinks wine or mixed drinks socially    Family History  Problem Relation Age of Onset  . Hyperlipidemia Mother   . Hypertension Mother   . Anesthesia problems Mother   . Coronary artery disease Father     CABGx3 age 31  . Hyperlipidemia Father   . Atrial fibrillation Father   . Hypertension Father   . Heart disease Father   . Diabetes Father   . Hyperlipidemia Brother     x2  . Hyperlipidemia Sister     X63  . Cancer Sister     colon cancer  . Thyroid disease Sister   . Anesthesia problems Sister     Reviw of Systems:  Reviewed in the HPI.  All other systems are negative.  Physical Exam: Blood pressure 130/86, pulse 63, height 5\' 3"  (  1.6 m), weight 152 lb (68.947 kg). General: Well developed, well nourished, in no acute distress.  Head: Normocephalic, atraumatic, sclera non-icteric, mucus membranes are moist,   Neck: Supple. Carotids are 2 + without bruits. No JVD  Lungs: Clear bilaterally to auscultation.  Heart: regular rate.  normal  S1 S2. No murmurs, gallops or rubs.  Abdomen: Soft, non-tender, non-distended with normal bowel sounds. No hepatomegaly. No rebound/guarding. No masses.  Msk:  Strength and tone are normal  Extremities: No clubbing or cyanosis. No edema.  Distal pedal pulses are 2+ and equal bilaterally.  Neuro: Alert and oriented X 3. Moves all extremities spontaneously.  Psych:  Responds to questions appropriately with a normal affect.  ECG: NSR, normal ecg  Assessment / Plan:

## 2011-05-16 NOTE — Progress Notes (Signed)
Addended by: Reine Just on: 05/16/2011 09:56 AM   Modules accepted: Orders

## 2011-07-23 ENCOUNTER — Other Ambulatory Visit: Payer: Self-pay | Admitting: Neurosurgery

## 2011-07-23 ENCOUNTER — Ambulatory Visit
Admission: RE | Admit: 2011-07-23 | Discharge: 2011-07-23 | Disposition: A | Discharge status: Home / Self Care | Payer: BC Managed Care – PPO | Source: Ambulatory Visit | Attending: Neurosurgery | Admitting: Neurosurgery

## 2011-07-23 DIAGNOSIS — M545 Low back pain, unspecified: Secondary | ICD-10-CM

## 2011-07-23 MED ORDER — GADOBENATE DIMEGLUMINE 529 MG/ML IV SOLN
14.0000 mL | Freq: Once | INTRAVENOUS | Status: AC | PRN
  Administered 2011-07-23: 14 mL via INTRAVENOUS

## 2011-11-26 ENCOUNTER — Other Ambulatory Visit: Payer: Self-pay | Admitting: Obstetrics and Gynecology

## 2011-11-26 DIAGNOSIS — Z1231 Encounter for screening mammogram for malignant neoplasm of breast: Secondary | ICD-10-CM

## 2011-12-25 ENCOUNTER — Encounter: Payer: Self-pay | Admitting: Obstetrics and Gynecology

## 2011-12-25 ENCOUNTER — Ambulatory Visit (INDEPENDENT_AMBULATORY_CARE_PROVIDER_SITE_OTHER): Payer: BC Managed Care – PPO | Admitting: Obstetrics and Gynecology

## 2011-12-25 VITALS — BP 120/74 | Ht 65.5 in | Wt 148.0 lb

## 2011-12-25 DIAGNOSIS — Z01419 Encounter for gynecological examination (general) (routine) without abnormal findings: Secondary | ICD-10-CM

## 2011-12-25 LAB — CBC WITH DIFFERENTIAL/PLATELET
Basophils Absolute: 0 10*3/uL (ref 0.0–0.1)
Basophils Relative: 1 % (ref 0–1)
Eosinophils Relative: 1 % (ref 0–5)
Lymphocytes Relative: 26 % (ref 12–46)
MCHC: 33.8 g/dL (ref 30.0–36.0)
MCV: 91.3 fL (ref 78.0–100.0)
Neutro Abs: 3.7 10*3/uL (ref 1.7–7.7)
Platelets: 223 10*3/uL (ref 150–400)
RDW: 12.9 % (ref 11.5–15.5)
WBC: 6 10*3/uL (ref 4.0–10.5)

## 2011-12-25 LAB — TSH: TSH: 2.422 u[IU]/mL (ref 0.350–4.500)

## 2011-12-25 MED ORDER — ZOLPIDEM TARTRATE 10 MG PO TABS: 10.0000 mg | ORAL_TABLET | Freq: Every evening | ORAL | Status: DC | PRN

## 2011-12-25 NOTE — Patient Instructions (Signed)
Let me know about medication.

## 2011-12-25 NOTE — Progress Notes (Signed)
Patient came to see me today for her annual GYN exam. She is premenopausal but is amenorrheic due  to her endometrial ablation. We actually had  checked her [facility] last year and was normal. We have been watching her TSH because of a family history. Her cholesterols watched by her cardiologist. She is scheduled for yearly mammogram. She does Kegel exercises irregularly For stress incontinence. However this year she is noticing extreme urinary urgency and frequency. She has never had an abnormal Pap smear. Her last Pap smear was 2012. She is having no abnormal bleeding or pelvic pain.  Physical examination:[name] present. HEENT within normal limits. Neck: Thyroid not large. No masses. Supraclavicular nodes: not enlarged. Breasts: Examined in both sitting and lying  position. No skin changes and no masses. Abdomen: Soft no guarding rebound or masses or hernia. Pelvic: External: Within normal limits. BUS: Within normal limits. Vaginal:within normal limits. Good estrogen effect. No evidence of cystocele rectocele or enterocele. Cervix: clean. Uterus: Normal size and shape. Adnexa: No masses. Rectovaginal exam: Confirmatory and negative. Extremities: Within normal limits.  Assessment: Urinary incontinence-mixed  Plan: Continue Kegel exercises. Add either Toviaz, Enablex, Vesicare or Ditropan based on insurance. Pt will let me know. Continue yearly mammograms. Patient has occasional sleep disturbance. Prescription for Ambien 10 mg-half to full tablet as needed. Pap not done.The new Pap smear guidelines were discussed with the patient. Also discussed urological consult but will wait.

## 2011-12-26 LAB — URINALYSIS W MICROSCOPIC + REFLEX CULTURE
Bilirubin Urine: NEGATIVE
Glucose, UA: NEGATIVE mg/dL
Hgb urine dipstick: NEGATIVE
Protein, ur: NEGATIVE mg/dL

## 2011-12-31 ENCOUNTER — Ambulatory Visit
Admission: RE | Admit: 2011-12-31 | Discharge: 2011-12-31 | Disposition: A | Discharge status: Home / Self Care | Payer: BC Managed Care – PPO | Source: Ambulatory Visit | Attending: Obstetrics and Gynecology | Admitting: Obstetrics and Gynecology

## 2011-12-31 DIAGNOSIS — Z1231 Encounter for screening mammogram for malignant neoplasm of breast: Secondary | ICD-10-CM

## 2012-03-21 ENCOUNTER — Other Ambulatory Visit: Payer: Self-pay

## 2012-12-10 ENCOUNTER — Other Ambulatory Visit: Payer: Self-pay

## 2013-10-14 ENCOUNTER — Ambulatory Visit: Payer: Self-pay | Admitting: Gynecology

## 2013-11-04 ENCOUNTER — Other Ambulatory Visit (HOSPITAL_COMMUNITY)
Admission: RE | Admit: 2013-11-04 | Discharge: 2013-11-04 | Disposition: A | Discharge status: Home / Self Care | Payer: BC Managed Care – PPO | Source: Ambulatory Visit | Attending: Gynecology | Admitting: Gynecology

## 2013-11-04 ENCOUNTER — Ambulatory Visit (INDEPENDENT_AMBULATORY_CARE_PROVIDER_SITE_OTHER): Payer: BC Managed Care – PPO | Admitting: Gynecology

## 2013-11-04 ENCOUNTER — Encounter: Payer: Self-pay | Admitting: Gynecology

## 2013-11-04 VITALS — BP 120/70 | Ht 65.5 in | Wt 160.0 lb

## 2013-11-04 DIAGNOSIS — Z23 Encounter for immunization: Secondary | ICD-10-CM

## 2013-11-04 DIAGNOSIS — Z1151 Encounter for screening for human papillomavirus (HPV): Secondary | ICD-10-CM | POA: Diagnosis present

## 2013-11-04 DIAGNOSIS — Z01419 Encounter for gynecological examination (general) (routine) without abnormal findings: Secondary | ICD-10-CM

## 2013-11-04 DIAGNOSIS — N3281 Overactive bladder: Secondary | ICD-10-CM

## 2013-11-04 DIAGNOSIS — Z85038 Personal history of other malignant neoplasm of large intestine: Secondary | ICD-10-CM

## 2013-11-04 MED ORDER — MIRABEGRON ER 25 MG PO TB24: 25.0000 mg | ORAL_TABLET | Freq: Every day | ORAL | Status: DC

## 2013-11-04 NOTE — Progress Notes (Signed)
Nancy Sparks November 08, 1965 [id number]   History:    48 y.o.  for annual exam and his main complaint today is of urinary frequency and nocturia. Patient states symptoms are present regardless of fluid intake. She denies any stress urinary incontinence. Patient requesting flu vaccine today.She is premenopausal but is amenorrheic due to her endometrial ablation. Patient with no vasomotor symptoms. Patient with no past history of abnormal Pap smears. Patient states her Tdap was less than 10 years ago. Patient had a colonoscopy in 2011 because of her sister's history of colon cancer. Patient's cardiologist Dr.Nasher has been treating the patient for hyperlipidemia.     Past medical history,surgical history, family history and social history were all reviewed and documented in the EPIC chart.  Gynecologic History No LMP recorded. Patient has had an ablation. Contraception: tubal ligation Last Pap: 2012 . Results were: normal Last mammogram: 2013. Results were: normal  Obstetric History OB History  Gravida Para Term Preterm AB SAB TAB Ectopic Multiple Living  4 3 3  1     3     # Outcome Date GA Lbr Len/2nd Weight Sex Delivery Anes PTL Lv  4 ABT           3 TRM           2 TRM           1 TRM                ROS: A ROS was performed and pertinent positives and negatives are included in the history.  GENERAL: No fevers or chills. HEENT: No change in vision, no earache, sore throat or sinus congestion. NECK: No pain or stiffness. CARDIOVASCULAR: No chest pain or pressure. No palpitations. PULMONARY: No shortness of breath, cough or wheeze. GASTROINTESTINAL: No abdominal pain, nausea, vomiting or diarrhea, melena or bright red blood per rectum. GENITOURINARY: No urinary frequency, urgency, hesitancy or dysuria. MUSCULOSKELETAL: No joint or muscle pain, no back pain, no recent trauma. DERMATOLOGIC: No rash, no itching, no lesions. ENDOCRINE: No polyuria, polydipsia, no heat or cold intolerance.  No recent change in weight. HEMATOLOGICAL: No anemia or easy bruising or bleeding. NEUROLOGIC: No headache, seizures, numbness, tingling or weakness. PSYCHIATRIC: No depression, no loss of interest in normal activity or change in sleep pattern.     Exam: chaperone present  BP 120/70  Ht 5' 5.5" (1.664 m)  Wt 160 lb (72.576 kg)  BMI 26.21 kg/m2  Body mass index is 26.21 kg/(m^2).  General appearance : Well developed well nourished female. No acute distress HEENT: Neck supple, trachea midline, no carotid bruits, no thyroidmegaly Lungs: Clear to auscultation, no rhonchi or wheezes, or rib retractions  Heart: Regular rate and rhythm, no murmurs or gallops Breast:Examined in sitting and supine position were symmetrical in appearance, no palpable masses or tenderness,  no skin retraction, no nipple inversion, no nipple discharge, no skin discoloration, no axillary or supraclavicular lymphadenopathy Abdomen: no palpable masses or tenderness, no rebound or guarding Extremities: no edema or skin discoloration or tenderness  Pelvic:  Bartholin, Urethra, Skene Glands: Within normal limits             Vagina: No gross lesions or discharge  Cervix: No gross lesions or discharge  Uterus  anteverted, normal size, shape and consistency, non-tender and mobile  Adnexa  Without masses or tenderness  Anus and perineum  normal   Rectovaginal  normal sphincter tone without palpated masses or tenderness  Hemoccult not done     Assessment/Plan:  48 y.o. 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 We're going to start her on  Myrbetriq 25 mg daily. She will return back to the office next week and a fasting state for the following labs: Comprehensive metabolic panel, TSH, CBC, and urinalysis. Pap smear was done today. Patient was provided with fecal Hemoccult cards to submit to the office for testing. She was  also provided with a requisition to schedule her mammogram. Discussed the importance of monthly self breast exams. The patient received the flu vaccine today.    Terrance Mass MD, 4:52 PM 11/04/2013

## 2013-11-04 NOTE — Patient Instructions (Signed)
Mirabegron extended-release tablets What is this medicine? MIRABEGRON (MIR a BEG ron) is used to treat overactive bladder. This medicine reduces the amount of bathroom visits. It may also help to control wetting accidents. This medicine may be used for other purposes; ask your health care provider or pharmacist if you have questions. COMMON BRAND NAME(S): Myrbetriq What should I tell my health care provider before I take this medicine? They need to know if you have any of these conditions: -difficulty passing urine -high blood pressure -kidney disease -liver disease -an unusual or allergic reaction to mirabegron, other medicines, foods, dyes, or preservatives -pregnant or trying to get pregnant -breast-feeding How should I use this medicine? Take this medicine by mouth with a glass of water. Follow the directions on the prescription label. Do not cut, crush or chew this medicine. You can take it with or without food. If it upsets your stomach, take it with food. Take your medicine at regular intervals. Do not take it more often than directed. Do not stop taking except on your doctor's advice. Talk to your pediatrician regarding the use of this medicine in children. Special care may be needed. Overdosage: If you think you've taken too much of this medicine contact a poison control center or emergency room at once. Overdosage: If you think you have taken too much of this medicine contact a poison control center or emergency room at once. NOTE: This medicine is only for you. Do not share this medicine with others. What if I miss a dose? If you miss a dose, take it as soon as you can. If it is almost time for your next dose, take only that dose. Do not take double or extra doses. What may interact with this medicine? -certain medicines for bladder problems like fesoterodine, oxybutynin, solifenacin, tolterodine -desipramine -digoxin -flecainide -ketoconazole -MAOIs like Carbex, Eldepryl,  Marplan, Nardil, and Parnate -metoprolol -propafenone -thioridazine -warfarin This list may not describe all possible interactions. Give your health care provider a list of all the medicines, herbs, non-prescription drugs, or dietary supplements you use. Also tell them if you smoke, drink alcohol, or use illegal drugs. Some items may interact with your medicine. What should I watch for while using this medicine? It may take 8 weeks to notice the full benefit from this medicine. You may need to limit your intake tea, coffee, caffeinated sodas, and alcohol. These drinks may make your symptoms worse. Visit your doctor or health care professional for regular checks on your progress. Check your blood pressure as directed. Ask your doctor or health care professional what your blood pressure should be and when you should contact him or her. What side effects may I notice from receiving this medicine? Side effects that you should report to your doctor or health care professional as soon as possible: -allergic reactions like skin rash, itching or hives, swelling of the face, lips, or tongue -chest pain or palpitations -severe or sudden headache -high blood pressure -fast, irregular heartbeat -redness, blistering, peeling or loosening of the skin, including inside the mouth -signs of infection - fever or chills, pain or difficulty passing urine -trouble passing urine or change in the amount of urine Side effects that usually do not require medical attention (Report these to your doctor or health care professional if they continue or are bothersome.): -constipation -dry eyes -joint pain -mild headache -nausea -runny nose This list may not describe all possible side effects. Call your doctor for medical advice about side effects. You may report  side effects to FDA at 1-800-FDA-1088. Where should I keep my medicine? Keep out of the reach of children. Store at room temperature between 15 and 30  degrees C (59 and 86 degrees F). Throw away any unused medicine after the expiration date. NOTE: This sheet is a summary. It may not cover all possible information. If you have questions about this medicine, talk to your doctor, pharmacist, or health care provider.  2015, Elsevier/Gold Standard. (2011-10-04 15:59:47) Influenza Virus Vaccine injection (Fluarix) What is this medicine? INFLUENZA VIRUS VACCINE (in floo EN zuh VAHY ruhs vak SEEN) helps to reduce the risk of getting influenza also known as the flu. This medicine may be used for other purposes; ask your health care provider or pharmacist if you have questions. COMMON BRAND NAME(S): Fluarix, Fluzone What should I tell my health care provider before I take this medicine? They need to know if you have any of these conditions: -bleeding disorder like hemophilia -fever or infection -Guillain-Barre syndrome or other neurological problems -immune system problems -infection with the human immunodeficiency virus (HIV) or AIDS -low blood platelet counts -multiple sclerosis -an unusual or allergic reaction to influenza virus vaccine, eggs, chicken proteins, latex, gentamicin, other medicines, foods, dyes or preservatives -pregnant or trying to get pregnant -breast-feeding How should I use this medicine? This vaccine is for injection into a muscle. It is given by a health care professional. A copy of Vaccine Information Statements will be given before each vaccination. Read this sheet carefully each time. The sheet may change frequently. Talk to your pediatrician regarding the use of this medicine in children. Special care may be needed. Overdosage: If you think you have taken too much of this medicine contact a poison control center or emergency room at once. NOTE: This medicine is only for you. Do not share this medicine with others. What if I miss a dose? This does not apply. What may interact with this medicine? -chemotherapy or  radiation therapy -medicines that lower your immune system like etanercept, anakinra, infliximab, and adalimumab -medicines that treat or prevent blood clots like warfarin -phenytoin -steroid medicines like prednisone or cortisone -theophylline -vaccines This list may not describe all possible interactions. Give your health care provider a list of all the medicines, herbs, non-prescription drugs, or dietary supplements you use. Also tell them if you smoke, drink alcohol, or use illegal drugs. Some items may interact with your medicine. What should I watch for while using this medicine? Report any side effects that do not go away within 3 days to your doctor or health care professional. Call your health care provider if any unusual symptoms occur within 6 weeks of receiving this vaccine. You may still catch the flu, but the illness is not usually as bad. You cannot get the flu from the vaccine. The vaccine will not protect against colds or other illnesses that may cause fever. The vaccine is needed every year. What side effects may I notice from receiving this medicine? Side effects that you should report to your doctor or health care professional as soon as possible: -allergic reactions like skin rash, itching or hives, swelling of the face, lips, or tongue Side effects that usually do not require medical attention (report to your doctor or health care professional if they continue or are bothersome): -fever -headache -muscle aches and pains -pain, tenderness, redness, or swelling at site where injected -weak or tired This list may not describe all possible side effects. Call your doctor for medical advice about side  effects. You may report side effects to FDA at 1-800-FDA-1088. Where should I keep my medicine? This vaccine is only given in a clinic, pharmacy, doctor's office, or other health care setting and will not be stored at home. NOTE: This sheet is a summary. It may not cover all  possible information. If you have questions about this medicine, talk to your doctor, pharmacist, or health care provider.  2015, Elsevier/Gold Standard. (2007-08-19 09:30:40)

## 2013-11-05 ENCOUNTER — Other Ambulatory Visit: Payer: BC Managed Care – PPO

## 2013-11-05 DIAGNOSIS — Z01419 Encounter for gynecological examination (general) (routine) without abnormal findings: Secondary | ICD-10-CM

## 2013-11-05 LAB — URINALYSIS W MICROSCOPIC + REFLEX CULTURE
BILIRUBIN URINE: NEGATIVE
CASTS: NONE SEEN
CRYSTALS: NONE SEEN
Glucose, UA: NEGATIVE mg/dL
Hgb urine dipstick: NEGATIVE
KETONES UR: NEGATIVE mg/dL
Leukocytes, UA: NEGATIVE
NITRITE: NEGATIVE
PH: 6 (ref 5.0–8.0)
Protein, ur: NEGATIVE mg/dL
SPECIFIC GRAVITY, URINE: 1.028 (ref 1.005–1.030)
Urobilinogen, UA: 0.2 mg/dL (ref 0.0–1.0)

## 2013-11-06 LAB — COMPREHENSIVE METABOLIC PANEL
ALBUMIN: 3.8 g/dL (ref 3.5–5.2)
ALT: 10 U/L (ref 0–35)
AST: 14 U/L (ref 0–37)
Alkaline Phosphatase: 50 U/L (ref 39–117)
BUN: 18 mg/dL (ref 6–23)
CALCIUM: 8.6 mg/dL (ref 8.4–10.5)
CHLORIDE: 105 meq/L (ref 96–112)
CO2: 24 mEq/L (ref 19–32)
Creat: 0.65 mg/dL (ref 0.50–1.10)
GLUCOSE: 91 mg/dL (ref 70–99)
POTASSIUM: 4 meq/L (ref 3.5–5.3)
Sodium: 136 mEq/L (ref 135–145)
Total Bilirubin: 0.4 mg/dL (ref 0.2–1.2)
Total Protein: 6.1 g/dL (ref 6.0–8.3)

## 2013-11-06 LAB — LIPID PANEL
CHOLESTEROL: 234 mg/dL — AB (ref 0–200)
HDL: 62 mg/dL (ref >39)
LDL Cholesterol: 146 mg/dL — ABNORMAL HIGH (ref 0–99)
TRIGLYCERIDES: 131 mg/dL (ref <150)
Total CHOL/HDL Ratio: 3.8 Ratio
VLDL: 26 mg/dL (ref 0–40)

## 2013-11-06 LAB — TSH: TSH: 2.272 u[IU]/mL (ref 0.350–4.500)

## 2013-11-06 LAB — CBC WITH DIFFERENTIAL/PLATELET
Basophils Absolute: 0 10*3/uL (ref 0.0–0.1)
Basophils Relative: 1 % (ref 0–1)
Eosinophils Absolute: 0.1 10*3/uL (ref 0.0–0.7)
Eosinophils Relative: 2 % (ref 0–5)
HEMATOCRIT: 36.7 % (ref 36.0–46.0)
HEMOGLOBIN: 12.3 g/dL (ref 12.0–15.0)
LYMPHS PCT: 18 % (ref 12–46)
Lymphs Abs: 0.7 10*3/uL (ref 0.7–4.0)
MCH: 30.4 pg (ref 26.0–34.0)
MCHC: 33.5 g/dL (ref 30.0–36.0)
MCV: 90.8 fL (ref 78.0–100.0)
MONO ABS: 0.5 10*3/uL (ref 0.1–1.0)
MONOS PCT: 13 % — AB (ref 3–12)
NEUTROS ABS: 2.6 10*3/uL (ref 1.7–7.7)
Neutrophils Relative %: 66 % (ref 43–77)
Platelets: 171 10*3/uL (ref 150–400)
RBC: 4.04 MIL/uL (ref 3.87–5.11)
RDW: 13.3 % (ref 11.5–15.5)
WBC: 3.9 10*3/uL — AB (ref 4.0–10.5)

## 2013-11-08 ENCOUNTER — Encounter: Payer: Self-pay | Admitting: Cardiovascular Disease

## 2013-11-08 ENCOUNTER — Ambulatory Visit (INDEPENDENT_AMBULATORY_CARE_PROVIDER_SITE_OTHER): Payer: BC Managed Care – PPO | Admitting: Cardiovascular Disease

## 2013-11-08 VITALS — BP 130/82 | HR 79 | Ht 65.5 in | Wt 162.0 lb

## 2013-11-08 DIAGNOSIS — R002 Palpitations: Secondary | ICD-10-CM

## 2013-11-08 DIAGNOSIS — E785 Hyperlipidemia, unspecified: Secondary | ICD-10-CM

## 2013-11-08 LAB — CYTOLOGY - PAP

## 2013-11-08 MED ORDER — ATORVASTATIN CALCIUM 40 MG PO TABS: 40.0000 mg | ORAL_TABLET | Freq: Every day | ORAL | Status: DC

## 2013-11-08 NOTE — Assessment & Plan Note (Signed)
She has not taken her Crestor. She stopped all the medications several years ago when she "got fed up with taking so many pills" Is willing to restart a statin Will start Atorvastatin 40 Check fasting labs in 3 months.  I will see her in 1 year  encouraged her to walk regularly - she uses [employer].

## 2013-11-08 NOTE — Progress Notes (Signed)
Bonni Neuser Rensch Date of Birth  1965-12-25 [place] [address]    [place]   [place], [place]  [address]    [place], [place]  [address] [phone]  Fax  [phone]  [phone]  Fax [phone]  Problems: 1. Dyslipidemia 2. Palpitations 3. Indigestion 4. ack surgery  History of Present Illness:  Pt is doing well.  She had a laminectomy several weeks ago.  She is recovering well.  She is disappointed that she will miss her tennis season this year.    Oct. 5, 2015:  Trish has had a rough several years.  She is slowly recoverying.  She quit taking all of her meds. Gained 15 lbs.  Quit playing tennis.   Was tired of taking pain meds.  Really go fed up with life for a while and now is back on track with her life.     She has not had any specific cardiac issues.  Is concerned that she is approaching 48 years old.    Current Outpatient Prescriptions on File Prior to Visit  Medication Sig Dispense Refill  . [DISCONTINUED] calcium carbonate (OS-CAL) 600 MG TABS Take 600 mg by mouth 2 (two) times daily with meals.         No current facility-administered medications on file prior to visit.    Allergies  Allergen Reactions  . Latex   . Tape Rash    Paper tape ok    Past Medical History  Diagnosis Date  . Dyslipidemia   . Fibroid   . Heart palpitations     hx of  . Anxiety   . Depression   . Complication of anesthesia     itching relieved with benadryl     Past Surgical History  Procedure Laterality Date  . Cesarean section      X3  . Tubal ligation    . Novasure ablation  2006  . Colonoscopy    . Lumbar laminectomy/decompression microdiscectomy  04/25/2011    Procedure: LUMBAR LAMINECTOMY/DECOMPRESSION MICRODISCECTOMY 1 LEVEL;  Surgeon: Elaina Hoops, MD;  Location: [facility];  Service: Neurosurgery;  Laterality: Left;  Left Lumbar Three-Four Laminectomy and Diskectomy  . Lumbar laminectomy/decompression  microdiscectomy  04/26/2011    Procedure: LUMBAR LAMINECTOMY/DECOMPRESSION MICRODISCECTOMY 1 LEVEL;  Surgeon: Elaina Hoops, MD;  Location: [facility];  Service: Neurosurgery;  Laterality: N/A;  Re-Exploration of Lumbar Wound and Evacuation of Epidural Hematoma    History  Smoking status  . Never Smoker   Smokeless tobacco  . Not on file    Comment: quit smoking in college    History  Alcohol Use  . 3.5 oz/week  . 7 drink(s) per week    Comment: drinks wine or mixed drinks socially    Family History  Problem Relation Age of Onset  . Hyperlipidemia Mother   . Hypertension Mother   . Anesthesia problems Mother   . Coronary artery disease Father     CABGx3 age 68  . Hyperlipidemia Father   . Atrial fibrillation Father   . Hypertension Father   . Heart disease Father   . Diabetes Father   . Hyperlipidemia Brother     x2  . Hyperlipidemia Sister     X65  . Cancer Sister     colon cancer  . Thyroid disease Sister   . Anesthesia problems Sister     Reviw of Systems:  Reviewed in  the HPI.  All other systems are negative.  Physical Exam: Blood pressure 130/82, pulse 79, height 5' 5.5" (1.664 m), weight 162 lb (73.483 kg). General: Well developed, well nourished, in no acute distress.  Head: Normocephalic, atraumatic, sclera non-icteric, mucus membranes are moist,   Neck: Supple. Carotids are 2 + without bruits. No JVD  Lungs: Clear bilaterally to auscultation.  Heart: regular rate.  normal  S1 S2. No murmurs, gallops or rubs.  Abdomen: Soft, non-tender, non-distended with normal bowel sounds. No hepatomegaly. No rebound/guarding. No masses.  Msk:  Strength and tone are normal  Extremities: No clubbing or cyanosis. No edema.  Distal pedal pulses are 2+ and equal bilaterally.  Neuro: Alert and oriented X 3. Moves all extremities spontaneously.  Psych:  Responds to questions appropriately with a normal affect.  ECG: Oct. 5, 2015:  NSR at 79 , normal  ecg  Assessment / Plan:

## 2013-11-08 NOTE — Assessment & Plan Note (Addendum)
Imo is  doing well. She's not had any significant palps. Continue same medications. I will see her in 1 year  She has gained a little weight over the year.  I think she is back on track and is mentally ready to get back into an exercise program.  She has played some tennis.   Encouraged her to get back into her exercise - I think it will help her palpitations.

## 2013-11-08 NOTE — Patient Instructions (Signed)
Your physician has recommended you make the following change in your medication:  START Atorvastatin 40 mg once daily  Your physician recommends that you return for lab work in: 3 months -  You will need to FAST for this appointment - nothing to eat or drink after midnight the night before except water.  Your physician wants you to follow-up in: 1 year with Dr. Acie Fredrickson. You will receive a reminder letter in the mail two months in advance. If you don't receive a letter, please call our office to schedule the follow-up appointment.

## 2013-12-06 ENCOUNTER — Encounter: Payer: Self-pay | Admitting: Cardiovascular Disease

## 2014-02-09 ENCOUNTER — Other Ambulatory Visit: Payer: BC Managed Care – PPO

## 2014-04-24 ENCOUNTER — Encounter: Payer: Self-pay | Admitting: Internal Medicine

## 2014-04-24 DIAGNOSIS — Z800 Family history of malignant neoplasm of digestive organs: Secondary | ICD-10-CM | POA: Insufficient documentation

## 2014-04-25 ENCOUNTER — Encounter: Payer: Self-pay | Admitting: Internal Medicine

## 2014-06-15 ENCOUNTER — Telehealth: Payer: Self-pay | Admitting: *Deleted

## 2014-06-15 MED ORDER — ALPRAZOLAM 0.25 MG PO TABS: 0.2500 mg | ORAL_TABLET | Freq: Every evening | ORAL | Status: DC | PRN

## 2014-06-15 NOTE — Telephone Encounter (Signed)
Pt called to see if you would be willing to prescribed Rx such as xanax. Pt said her father has been diagnosed with stage 4 lung cancer and has now fell and broke his hip. Her mother has dementia and she is taking care of her. Pt said her mind is constantly racing with thoughts. She is out of town her husband is meeting her tomorrow and can bring Rx to her if you are willing to do this. Please advise

## 2014-06-15 NOTE — Telephone Encounter (Signed)
Pt informed with the below note. 

## 2014-06-15 NOTE — Telephone Encounter (Signed)
Her husband will have to come by the office to pick it up since she needs to be weighed. Call in prescription for Xanax 0.25 mg to take 1 by mouth daily when necessary. #30 with 3 refills

## 2014-09-13 ENCOUNTER — Encounter: Payer: Self-pay | Admitting: Internal Medicine

## 2014-11-02 ENCOUNTER — Encounter: Payer: Self-pay | Admitting: Family Medicine

## 2014-12-20 ENCOUNTER — Encounter: Payer: Self-pay | Admitting: Cardiovascular Disease

## 2015-01-03 ENCOUNTER — Ambulatory Visit (INDEPENDENT_AMBULATORY_CARE_PROVIDER_SITE_OTHER): Payer: BLUE CROSS/BLUE SHIELD | Admitting: Cardiovascular Disease

## 2015-01-03 ENCOUNTER — Encounter: Payer: Self-pay | Admitting: Cardiovascular Disease

## 2015-01-03 VITALS — BP 140/84 | HR 88 | Ht 65.5 in | Wt 172.0 lb

## 2015-01-03 DIAGNOSIS — E785 Hyperlipidemia, unspecified: Secondary | ICD-10-CM | POA: Diagnosis not present

## 2015-01-03 DIAGNOSIS — I10 Essential (primary) hypertension: Secondary | ICD-10-CM

## 2015-01-03 MED ORDER — ROSUVASTATIN CALCIUM 10 MG PO TABS: 10.0000 mg | ORAL_TABLET | Freq: Every day | ORAL | Status: DC

## 2015-01-03 NOTE — Progress Notes (Signed)
Nancy Sparks Date of Birth  1965-05-10 [place] [address]    [place]   [place], [place]  [address]    [place], [place]  [address] [phone]  Fax  [phone]  [phone]  Fax [phone]  Problems: 1. Dyslipidemia 2. Palpitations 3. Indigestion 4. back surgery  History of Present Illness:  Pt is doing well.  She had a laminectomy several weeks ago.  She is recovering well.  She is disappointed that she will miss her tennis season this year.    Oct. 5, 2015:  Nancy Sparks has had a rough several years.  She is slowly recoverying.  She quit taking all of her meds. Gained 15 lbs.  Quit playing tennis.   Was tired of taking pain meds.  Really go fed up with life for a while and now is back on track with her life.     She has not had any specific cardiac issues.  Is concerned that she is approaching 49 years old.   Nov. 29, 2016   Nancy Sparks has had a stressful year. Father died this past May Lots of stress, weight gain  Wt Readings from Last 3 Encounters:  01/03/15 172 lb (78.019 kg)  11/08/13 162 lb (73.483 kg)  11/04/13 160 lb (72.576 kg)   Not eating well  - lots of fast foods. BP has been higher.  Has had some blurry vision  Has started back playing tennis again.  Takes Crestor sporatically  LDL chol was measured recently - 19 Has a family hx of CAD ( father had CABG at 58)  Mother has HTN      Current Outpatient Prescriptions on File Prior to Visit  Medication Sig Dispense Refill  . ALPRAZolam (XANAX) 0.25 MG tablet Take 1 tablet (0.25 mg total) by mouth at bedtime as needed for anxiety. 30 tablet 3  . [DISCONTINUED] calcium carbonate (OS-CAL) 600 MG TABS Take 600 mg by mouth 2 (two) times daily with meals.       No current facility-administered medications on file prior to visit.    Allergies  Allergen Reactions  . Latex   . Tape Rash    Paper tape ok    Past Medical History  Diagnosis Date  .  Dyslipidemia   . Fibroid   . Heart palpitations     hx of  . Anxiety   . Depression   . Complication of anesthesia     itching relieved with benadryl   . Hx of adenomatous polyp of colon 12/24/2002    Past Surgical History  Procedure Laterality Date  . Cesarean section      X3  . Tubal ligation    . Novasure ablation  2006  . Colonoscopy    . Lumbar laminectomy/decompression microdiscectomy  04/25/2011    Procedure: LUMBAR LAMINECTOMY/DECOMPRESSION MICRODISCECTOMY 1 LEVEL;  Surgeon: Elaina Hoops, MD;  Location: [facility];  Service: Neurosurgery;  Laterality: Left;  Left Lumbar Three-Four Laminectomy and Diskectomy  . Lumbar laminectomy/decompression microdiscectomy  04/26/2011    Procedure: LUMBAR LAMINECTOMY/DECOMPRESSION MICRODISCECTOMY 1 LEVEL;  Surgeon: Elaina Hoops, MD;  Location: [facility];  Service: Neurosurgery;  Laterality: N/A;  Re-Exploration of Lumbar Wound and Evacuation of Epidural Hematoma    History  Smoking status  . Never Smoker   Smokeless tobacco  . Not on file    Comment: quit smoking in college    History  Alcohol  Use  . 3.5 oz/week  . 7 drink(s) per week    Comment: drinks wine or mixed drinks socially    Family History  Problem Relation Age of Onset  . Hyperlipidemia Mother   . Hypertension Mother   . Anesthesia problems Mother   . Coronary artery disease Father     CABGx3 age 24  . Hyperlipidemia Father   . Atrial fibrillation Father   . Hypertension Father   . Heart disease Father   . Diabetes Father   . Hyperlipidemia Brother     x2  . Hyperlipidemia Sister     X29  . Cancer Sister     colon cancer  . Thyroid disease Sister   . Anesthesia problems Sister     Reviw of Systems:  Reviewed in the HPI.  All other systems are negative.  Physical Exam: Blood pressure 144/78, pulse 88, height 5' 5.5" (1.664 m), weight 172 lb (78.019 kg). General: Well developed, well nourished, in no acute distress.  Head: Normocephalic,  atraumatic, sclera non-icteric, mucus membranes are moist,   Neck: Supple. Carotids are 2 + without bruits. No JVD  Lungs: Clear bilaterally to auscultation.  Heart: regular rate.  normal  S1 S2. No murmurs, gallops or rubs.  Abdomen: Soft, non-tender, non-distended with normal bowel sounds. No hepatomegaly. No rebound/guarding. No masses.  Msk:  Strength and tone are normal  Extremities: No clubbing or cyanosis. No edema.  Distal pedal pulses are 2+ and equal bilaterally.  Neuro: Alert and oriented X 3. Moves all extremities spontaneously.  Psych:  Responds to questions appropriately with a normal affect.  ECG: Nov. 29, 2016:    NSR at 88.  NSR   Assessment / Plan:   1. Dyslipidemia - last LDL was 179.   Stopped the Crestor   Recommended that she restart Crestor. We'll check her labs again in 3 months.  2. Palpitations  3. Indigestion  4. back surgery  5. Essential HTN:   Diet has not been very good.  Has been improving it some Is back walking , playing tennis some.  At this point I don't think that she needs any additional medications. She has a lot of room to improve her diet and exercise program. I also suggested that weight loss would deftly help her blood pressure. She'll keep a blood pressure log. I'll see her again in 3 months for follow-up visit.    Nahser, Wonda Cheng, MD  01/03/2015 8:53 AM    [facility] [place],  [place], [place]  [address] Pager [phone] Phone: [phone]; Fax: [phone]   [facility]  [address] [place], [place]  [address] [phone]   Fax [phone]

## 2015-01-03 NOTE — Patient Instructions (Signed)
Medication Instructions:  TAKE Crestor 10 mg once daily   Labwork: Your physician recommends that you return for lab work in: 3 months on the day of or a few days before your office visit with Dr. Acie Fredrickson.  You will need to FAST for this appointment - nothing to eat or drink after midnight the night before except water.    Testing/Procedures: None Ordered   Follow-Up: Your physician recommends that you schedule a follow-up appointment in: 3 months with Dr. Acie Fredrickson   If you need a refill on your cardiac medications before your next appointment, please call your pharmacy.   Thank you for choosing [facility]! Christen Bame, RN [phone]

## 2015-02-05 HISTORY — PX: COLONOSCOPY: SHX174

## 2015-04-03 ENCOUNTER — Other Ambulatory Visit (INDEPENDENT_AMBULATORY_CARE_PROVIDER_SITE_OTHER): Payer: BLUE CROSS/BLUE SHIELD | Admitting: *Deleted

## 2015-04-03 DIAGNOSIS — I10 Essential (primary) hypertension: Secondary | ICD-10-CM

## 2015-04-03 DIAGNOSIS — E785 Hyperlipidemia, unspecified: Secondary | ICD-10-CM | POA: Diagnosis not present

## 2015-04-03 LAB — LIPID PANEL
Cholesterol: 250 mg/dL — ABNORMAL HIGH (ref 125–200)
HDL: 74 mg/dL (ref >=46)
LDL CALC: 160 mg/dL — AB (ref <130)
Total CHOL/HDL Ratio: 3.4 Ratio (ref <=5.0)
Triglycerides: 79 mg/dL (ref <150)
VLDL: 16 mg/dL (ref <30)

## 2015-04-03 LAB — COMPREHENSIVE METABOLIC PANEL
ALT: 21 U/L (ref 6–29)
AST: 20 U/L (ref 10–35)
Albumin: 4.1 g/dL (ref 3.6–5.1)
Alkaline Phosphatase: 62 U/L (ref 33–130)
BILIRUBIN TOTAL: 0.3 mg/dL (ref 0.2–1.2)
BUN: 14 mg/dL (ref 7–25)
CO2: 26 mmol/L (ref 20–31)
CREATININE: 0.77 mg/dL (ref 0.50–1.05)
Calcium: 9.5 mg/dL (ref 8.6–10.4)
Chloride: 107 mmol/L (ref 98–110)
GLUCOSE: 99 mg/dL (ref 65–99)
Potassium: 4.1 mmol/L (ref 3.5–5.3)
SODIUM: 141 mmol/L (ref 135–146)
Total Protein: 6.7 g/dL (ref 6.1–8.1)

## 2015-04-06 ENCOUNTER — Ambulatory Visit: Payer: BLUE CROSS/BLUE SHIELD | Admitting: Cardiovascular Disease

## 2015-04-06 ENCOUNTER — Telehealth: Payer: Self-pay

## 2015-04-06 NOTE — Telephone Encounter (Signed)
LM on Vmail to CB to sch'd an appt  Originally LM on 03-27-15 to Call and sch'd colon

## 2015-04-06 NOTE — Telephone Encounter (Signed)
-----   Message from Gatha Mayer, MD sent at 03/24/2015  9:55 AM EST ----- Regarding: needs previsit Friend of mine hx polyps and FHx CRCA needs a previsit/colonoscopy  Husband also needs his first screen am sending something on him also

## 2015-04-10 ENCOUNTER — Encounter: Payer: Self-pay | Admitting: Internal Medicine

## 2015-04-10 ENCOUNTER — Telehealth: Payer: Self-pay | Admitting: Nurse Practitioner

## 2015-04-10 DIAGNOSIS — E785 Hyperlipidemia, unspecified: Secondary | ICD-10-CM

## 2015-04-10 NOTE — Telephone Encounter (Signed)
Reviewed lab results with patient.  She states she has not been taking her Crestor 10 mg consistently.  She states she is exercising more, walking 12 miles and playing tennis several times each week.  She would like to take the Crestor more regularly and follow-up with lab work and office visit in 3 months.  Per Dr. Acie Fredrickson, we will reorder blood work and schedule office visit in June.  Appointments scheduled and patient verbalized understanding and agreement.

## 2015-04-10 NOTE — Telephone Encounter (Signed)
-----   Message from Thayer Headings, MD sent at 04/03/2015  4:54 PM EST ----- Cholesterol and LDL are elevated.  Will discuss at her next OV

## 2015-06-07 ENCOUNTER — Ambulatory Visit (AMBULATORY_SURGERY_CENTER): Payer: Self-pay | Admitting: *Deleted

## 2015-06-07 ENCOUNTER — Encounter: Payer: Self-pay | Admitting: Internal Medicine

## 2015-06-07 VITALS — Ht 65.5 in | Wt 167.0 lb

## 2015-06-07 DIAGNOSIS — Z800 Family history of malignant neoplasm of digestive organs: Secondary | ICD-10-CM

## 2015-06-07 DIAGNOSIS — Z86010 Personal history of colonic polyps: Secondary | ICD-10-CM

## 2015-06-07 MED ORDER — MOVIPREP 100 G PO SOLR: ORAL | Status: DC

## 2015-06-07 NOTE — Progress Notes (Signed)
Patient denies any allergies to eggs or soy. Patient has "itching" after surgeries with anesthesia. Patient denies any oxygen use at home and does not take any diet/weight loss medications. EMMI education assisgned to patient on colonoscopy, this was explained and instructions given to patient. Patient requested Moviprep (prep that she did at last colonoscopy). Patient is aware of the cost and is ok with that, coupon given to pt. Moviprep instructions given to patient during pv.

## 2015-06-21 ENCOUNTER — Encounter: Payer: Self-pay | Admitting: Internal Medicine

## 2015-06-21 ENCOUNTER — Ambulatory Visit (AMBULATORY_SURGERY_CENTER): Payer: BLUE CROSS/BLUE SHIELD | Admitting: Internal Medicine

## 2015-06-21 VITALS — BP 127/83 | HR 76 | Temp 98.6°F | Resp 15 | Ht 65.0 in | Wt 172.0 lb

## 2015-06-21 DIAGNOSIS — Z800 Family history of malignant neoplasm of digestive organs: Secondary | ICD-10-CM

## 2015-06-21 DIAGNOSIS — D128 Benign neoplasm of rectum: Secondary | ICD-10-CM

## 2015-06-21 DIAGNOSIS — K621 Rectal polyp: Secondary | ICD-10-CM | POA: Diagnosis not present

## 2015-06-21 DIAGNOSIS — D120 Benign neoplasm of cecum: Secondary | ICD-10-CM

## 2015-06-21 DIAGNOSIS — Z86010 Personal history of colonic polyps: Secondary | ICD-10-CM

## 2015-06-21 MED ORDER — SODIUM CHLORIDE 0.9 % IV SOLN: 500.0000 mL | INTRAVENOUS | Status: DC

## 2015-06-21 NOTE — Progress Notes (Signed)
A/ox3, pleased with MAC, report to RN 

## 2015-06-21 NOTE — Progress Notes (Signed)
Pt was alert when she entered the recovery room.  When I asked how she felt, pt replied, "I feel like a migraine headache id going on.  I have floaters now."  I turned the light off in the recovery room bay.  I offered her a cool compress to her head.  Pt said no, she did not need a cool cloth.  Pt said she though she needed her caffiene and food too.  I did tell her she coulf have her coffee and food when she is discharged . Pt was pleasant and smiling.  No futher complaints noted. maw

## 2015-06-21 NOTE — Progress Notes (Signed)
Called to room to assist during endoscopic procedure.  Patient ID and intended procedure confirmed with present staff. Received instructions for my participation in the procedure from the performing physician.  

## 2015-06-21 NOTE — Op Note (Signed)
[facility] Patient Name: Nancy Sparks Procedure Date: 06/21/2015 8:27 AM MRN: [id number] Endoscopist: Gatha Mayer , MD Age: 50 Referring MD:  Date of Birth: 1965-07-28 Gender: Female Procedure:                Colonoscopy Indications:              Surveillance: Personal history of adenomatous                            polyps on last colonoscopy > 5 years ago, Family                            history of colon cancer in a first-degree relative Medicines:                Propofol per Anesthesia, Monitored Anesthesia Care Procedure:                Pre-Anesthesia Assessment:                           - Prior to the procedure, a History and Physical                            was performed, and patient medications and                            allergies were reviewed. The patient's tolerance of                            previous anesthesia was also reviewed. The risks                            and benefits of the procedure and the sedation                            options and risks were discussed with the patient.                            All questions were answered, and informed consent                            was obtained. Prior Anticoagulants: The patient has                            taken no previous anticoagulant or antiplatelet                            agents. ASA Grade Assessment: II - A patient with                            mild systemic disease. After reviewing the risks                            and benefits, the patient was deemed in  satisfactory condition to undergo the procedure.                           After obtaining informed consent, the colonoscope                            was passed under direct vision. Throughout the                            procedure, the patient's blood pressure, pulse, and                            oxygen saturations were monitored continuously. The                            Model  CF-HQ190L [id number]) scope was introduced                            through the anus and advanced to the the cecum,                            identified by appendiceal orifice and ileocecal                            valve. The colonoscopy was performed without                            difficulty. The patient tolerated the procedure                            well. The quality of the bowel preparation was                            good. The bowel preparation used was MoviPrep. The                            ileocecal valve, appendiceal orifice, and rectum                            were photographed. Scope In: 8:39:14 AM Scope Out: 8:53:20 AM Scope Withdrawal Time: 0 hours 11 minutes 37 seconds  Total Procedure Duration: 0 hours 14 minutes 6 seconds  Findings:                 The perianal and digital rectal examinations were                            normal.                           A 5 mm polyp was found in the cecum. The polyp was                            sessile. The polyp was removed with a cold snare.  Resection and retrieval were complete. Verification                            of patient identification for the specimen was                            done. Estimated blood loss was minimal.                           A 2 mm polyp was found in the rectum. The polyp was                            sessile. The polyp was removed with a cold biopsy                            forceps. Resection and retrieval were complete.                            Verification of patient identification for the                            specimen was done. Estimated blood loss was minimal.                           Multiple diverticula were found in the sigmoid                            colon. There was no evidence of diverticular                            bleeding.                           The exam was otherwise without abnormality on                             direct and retroflexion views. Complications:            No immediate complications. Estimated Blood Loss:     Estimated blood loss was minimal. Impression:               - One 5 mm polyp in the cecum, removed with a cold                            snare. Resected and retrieved.                           - One 2 mm polyp in the rectum, removed with a cold                            biopsy forceps. Resected and retrieved.                           - Mild diverticulosis in the sigmoid colon. There  was no evidence of diverticular bleeding.                           - The examination was otherwise normal on direct                            and retroflexion views.                           - Personal history of colonic polyps. TV adenoma                            2004 and sister with colon cancer Recommendation:           - Patient has a contact number available for                            emergencies. The signs and symptoms of potential                            delayed complications were discussed with the                            patient. Return to normal activities tomorrow.                            Written discharge instructions were provided to the                            patient.                           - Resume previous diet.                           - Continue present medications.                           - Repeat colonoscopy is recommended for                            surveillance. The colonoscopy date will be                            determined after pathology results from today's                            exam become available for review. Gatha Mayer, MD 06/21/2015 9:00:32 AM This report has been signed electronically.

## 2015-06-21 NOTE — Progress Notes (Signed)
No problems noted in the recovery room. maw 

## 2015-06-21 NOTE — Patient Instructions (Addendum)
Two very small polyps removed. I will let you know pathology results and when to have another routine colonoscopy by mail.  You also have a condition called diverticulosis - common and not usually a problem. Please read the handout provided.  I appreciate the opportunity to care for you. Gatha Mayer, MD, FACG       YOU HAD AN ENDOSCOPIC PROCEDURE TODAY AT [facility]:   Refer to the procedure report that was given to you for any specific questions about what was found during the examination.  If the procedure report does not answer your questions, please call your gastroenterologist to clarify.  If you requested that your care partner not be given the details of your procedure findings, then the procedure report has been included in a sealed envelope for you to review at your convenience later.  YOU SHOULD EXPECT: Some feelings of bloating in the abdomen. Passage of more gas than usual.  Walking can help get rid of the air that was put into your GI tract during the procedure and reduce the bloating. If you had a lower endoscopy (such as a colonoscopy or flexible sigmoidoscopy) you may notice spotting of blood in your stool or on the toilet paper. If you underwent a bowel prep for your procedure, you may not have a normal bowel movement for a few days.  Please Note:  You might notice some irritation and congestion in your nose or some drainage.  This is from the oxygen used during your procedure.  There is no need for concern and it should clear up in a day or so.  SYMPTOMS TO REPORT IMMEDIATELY:   Following lower endoscopy (colonoscopy or flexible sigmoidoscopy):  Excessive amounts of blood in the stool  Significant tenderness or worsening of abdominal pains  Swelling of the abdomen that is new, acute  Fever of 100F or higher  For urgent or emergent issues, a gastroenterologist can be reached at any hour by calling [phone].   DIET: Your first meal  following the procedure should be a small meal and then it is ok to progress to your normal diet. Heavy or fried foods are harder to digest and may make you feel nauseous or bloated.  Likewise, meals heavy in dairy and vegetables can increase bloating.  Drink plenty of fluids but you should avoid alcoholic beverages for 24 hours.  ACTIVITY:  You should plan to take it easy for the rest of today and you should NOT DRIVE or use heavy machinery until tomorrow (because of the sedation medicines used during the test).    FOLLOW UP: Our staff will call the number listed on your records the next business day following your procedure to check on you and address any questions or concerns that you may have regarding the information given to you following your procedure. If we do not reach you, we will leave a message.  However, if you are feeling well and you are not experiencing any problems, there is no need to return our call.  We will assume that you have returned to your regular daily activities without incident.  If any biopsies were taken you will be contacted by phone or by letter within the next 1-3 weeks.  Please call us at [phone] if you have not heard about the biopsies in 3 weeks.    SIGNATURES/CONFIDENTIALITY: You and/or your care partner have signed paperwork which will be entered into your electronic medical record.  These signatures  attest to the fact that that the information above on your After Visit Summary has been reviewed and is understood.  Full responsibility of the confidentiality of this discharge information lies with you and/or your care-partner.    Handouts were given to your care partner on polyps and diverticulosis. You may resume your current medications today. Await biopsy results. Please call if any questions or concerns.

## 2015-06-22 ENCOUNTER — Telehealth: Payer: Self-pay | Admitting: *Deleted

## 2015-06-22 NOTE — Telephone Encounter (Signed)
  Follow up Call-  Call back number 06/21/2015  Post procedure Call Back phone  # [phone]  Permission to leave phone message Yes     Lm on number left in admitting to return call if problems or questions. Lenard Galloway RN

## 2015-06-27 ENCOUNTER — Encounter: Payer: Self-pay | Admitting: Internal Medicine

## 2015-06-27 NOTE — Progress Notes (Signed)
Quick Note:  1 adenoma 1 hyperplastic recall 2022 ______

## 2015-06-29 ENCOUNTER — Telehealth: Payer: Self-pay | Admitting: *Deleted

## 2015-06-29 NOTE — Telephone Encounter (Signed)
I left a detailed message for the pt to call the office to schedule a new pt appt with Dr Maudie Mercury.

## 2015-06-29 NOTE — Telephone Encounter (Signed)
-----   Message from Lucretia Kern, DO sent at 06/27/2015 12:35 PM EDT ----- Regarding: RE: accept new patient please? Would be happy to see her. I copied my assistant on here, I will have her reach out to [facility] to let her know and help her to set up a new patient visit. Thanks. Jarrett Soho ----- Message -----    From: Gatha Mayer, MD    Sent: 06/27/2015  10:30 AM      To: Lucretia Kern, DO Subject: accept new patient please?                     Would you be willing to accept her as a new patient ? She is my patient and a friend and needs new PCP.  And she is healthy!  Thanks  Glendell Docker

## 2015-07-06 ENCOUNTER — Other Ambulatory Visit (INDEPENDENT_AMBULATORY_CARE_PROVIDER_SITE_OTHER): Payer: BLUE CROSS/BLUE SHIELD | Admitting: *Deleted

## 2015-07-06 DIAGNOSIS — E785 Hyperlipidemia, unspecified: Secondary | ICD-10-CM | POA: Diagnosis not present

## 2015-07-06 LAB — COMPREHENSIVE METABOLIC PANEL
ALK PHOS: 78 U/L (ref 33–130)
ALT: 21 U/L (ref 6–29)
AST: 18 U/L (ref 10–35)
Albumin: 4.3 g/dL (ref 3.6–5.1)
BILIRUBIN TOTAL: 0.4 mg/dL (ref 0.2–1.2)
BUN: 17 mg/dL (ref 7–25)
CO2: 24 mmol/L (ref 20–31)
CREATININE: 0.77 mg/dL (ref 0.50–1.05)
Calcium: 9.2 mg/dL (ref 8.6–10.4)
Chloride: 108 mmol/L (ref 98–110)
GLUCOSE: 90 mg/dL (ref 65–99)
POTASSIUM: 4.5 mmol/L (ref 3.5–5.3)
SODIUM: 140 mmol/L (ref 135–146)
TOTAL PROTEIN: 6.7 g/dL (ref 6.1–8.1)

## 2015-07-06 LAB — LIPID PANEL
CHOL/HDL RATIO: 3.5 ratio (ref <=5.0)
Cholesterol: 337 mg/dL — ABNORMAL HIGH (ref 125–200)
HDL: 97 mg/dL (ref >=46)
LDL Cholesterol: 217 mg/dL — ABNORMAL HIGH (ref <130)
Triglycerides: 114 mg/dL (ref <150)
VLDL: 23 mg/dL (ref <30)

## 2015-07-10 ENCOUNTER — Ambulatory Visit (INDEPENDENT_AMBULATORY_CARE_PROVIDER_SITE_OTHER): Payer: BLUE CROSS/BLUE SHIELD | Admitting: Cardiovascular Disease

## 2015-07-10 ENCOUNTER — Encounter: Payer: Self-pay | Admitting: Cardiovascular Disease

## 2015-07-10 VITALS — BP 130/80 | HR 80 | Ht 65.0 in | Wt 173.4 lb

## 2015-07-10 DIAGNOSIS — E785 Hyperlipidemia, unspecified: Secondary | ICD-10-CM

## 2015-07-10 DIAGNOSIS — I10 Essential (primary) hypertension: Secondary | ICD-10-CM

## 2015-07-10 NOTE — Progress Notes (Signed)
Nancy Sparks Date of Birth  29-Sep-1965        [address]    [address]      [place], [place]  [address]            Problems: 1. Dyslipidemia 2. Palpitations 3. Indigestion 4. back surgery  History of Present Illness:  Pt is doing well.  She had a laminectomy several weeks ago.  She is recovering well.  She is disappointed that she will miss her tennis season this year.    Oct. 5, 2015:  Nancy Sparks has had a rough several years.  She is slowly recoverying.  She quit taking all of her meds. Gained 15 lbs.  Quit playing tennis.   Was tired of taking pain meds.  Really go fed up with life for a while and now is back on track with her life.     She has not had any specific cardiac issues.  Is concerned that she is approaching 50 years old.   Nov. 29, 2016   Nancy Sparks has had a stressful year. Father died this past May Lots of stress, weight gain  Wt Readings from Last 3 Encounters:  01/03/15 172 lb (78.019 kg)  11/08/13 162 lb (73.483 kg)  11/04/13 160 lb (72.576 kg)   Not eating well  - lots of fast foods. BP has been higher.  Has had some blurry vision  Has started back playing tennis again.  Takes Crestor sporatically  LDL chol was measured recently - 75 Has a family hx of CAD ( father had CABG at 73)  Mother has HTN   July 10, 2015:   Nancy Sparks is seen today   Heading to [facility] tomorrow . Lipids are very elevated. Had lost about 10 lbs but has regained it back .    Current Outpatient Prescriptions on File Prior to Visit  Medication Sig Dispense Refill  . ALPRAZolam (XANAX) 0.25 MG tablet Take 1 tablet (0.25 mg total) by mouth at bedtime as needed for anxiety. 30 tablet 3  . rosuvastatin (CRESTOR) 10 MG tablet Take 1 tablet (10 mg total) by mouth daily. 31 tablet 11  . trazodone (DESYREL) 300 MG tablet Take 150 mg by mouth at bedtime.   0  . [DISCONTINUED] calcium carbonate (OS-CAL) 600 MG TABS Take 600 mg by mouth 2 (two) times daily with meals.       No  current facility-administered medications on file prior to visit.    Allergies  Allergen Reactions  . Latex Rash  . Tape Rash    Paper tape ok    Past Medical History  Diagnosis Date  . Dyslipidemia   . Fibroid   . Heart palpitations     hx of  . Anxiety   . Depression   . Complication of anesthesia     itching relieved with benadryl   . Hx of adenomatous polyp of colon 12/24/2002    Past Surgical History  Procedure Laterality Date  . Cesarean section      X3  . Tubal ligation    . Novasure ablation  2006  . Colonoscopy    . Lumbar laminectomy/decompression microdiscectomy  04/25/2011    Procedure: LUMBAR LAMINECTOMY/DECOMPRESSION MICRODISCECTOMY 1 LEVEL;  Surgeon: Elaina Hoops, MD;  Location: [facility];  Service: Neurosurgery;  Laterality: Left;  Left Lumbar Three-Four Laminectomy and Diskectomy  . Lumbar laminectomy/decompression microdiscectomy  04/26/2011    Procedure: LUMBAR LAMINECTOMY/DECOMPRESSION MICRODISCECTOMY 1 LEVEL;  Surgeon: Julien Girt  Saintclair Halsted, MD;  Location: [facility];  Service: Neurosurgery;  Laterality: N/A;  Re-Exploration of Lumbar Wound and Evacuation of Epidural Hematoma    History  Smoking status  . Never Smoker   Smokeless tobacco  . Never Used    Comment: quit smoking in college    History  Alcohol Use  . 3.5 oz/week  . 7 Standard drinks or equivalent per week    Comment: drinks wine or mixed drinks socially /1-4 nights per week per pt    Family History  Problem Relation Age of Onset  . Hyperlipidemia Mother   . Hypertension Mother   . Anesthesia problems Mother     nausea  . Coronary artery disease Father     CABGx3 age 80  . Hyperlipidemia Father   . Atrial fibrillation Father   . Hypertension Father   . Heart disease Father   . Diabetes Father   . Hyperlipidemia Brother     x2  . Hyperlipidemia Sister     X62  . Cancer Sister     colon cancer  . Thyroid disease Sister   . Anesthesia problems Sister     nausea  . Colon  cancer Sister 91    Reviw of Systems:  Reviewed in the HPI.  All other systems are negative.  Physical Exam: Blood pressure 130/80, pulse 80, height 5\' 5"  (1.651 m), weight 173 lb 6.4 oz (78.654 kg). General: Well developed, well nourished, in no acute distress.  Head: Normocephalic, atraumatic, sclera non-icteric, mucus membranes are moist,   Neck: Supple. Carotids are 2 + without bruits. No JVD  Lungs: Clear bilaterally to auscultation.  Heart: regular rate.  normal  S1 S2. No murmurs, gallops or rubs.  Abdomen: Soft, non-tender, non-distended with normal bowel sounds. No hepatomegaly. No rebound/guarding. No masses.  Msk:  Strength and tone are normal  Extremities: No clubbing or cyanosis. No edema.  Distal pedal pulses are 2+ and equal bilaterally.  Neuro: Alert and oriented X 3. Moves all extremities spontaneously.  Psych:  Responds to questions appropriately with a normal affect.  ECG: Nov. 29, 2016:    NSR at 88.  NSR   Assessment / Plan:   1. Dyslipidemia - her last lipid levels are very high. She admits that she does not rectally take her medication. Her total cholesterol was 337. Her LDL is 217.  We discussed setting an alarm to remind her to take the Crestor  She needs to work on a better diet and exercise program. I'll see her in 6 months   2. Palpitations  3. Indigestion  4. back surgery  5. Essential HTN:   Diet has not been very good.   BP is ok.      Mertie Moores, MD  07/10/2015 1:55 PM    [address],  [place], [place]  [address] Pager [phone] Phone: [phone]; Fax: [phone]   [facility]  [address] [place], [place]  [address] [phone]   Fax [phone]

## 2015-07-10 NOTE — Patient Instructions (Signed)
Medication Instructions:  Your physician recommends that you continue on your current medications as directed. Please refer to the Current Medication list given to you today.   Labwork: Your physician recommends that you return for lab work in: 3 months  You will need to FAST for this appointment - nothing to eat or drink after midnight the night before except water.    Testing/Procedures: None Ordered   Follow-Up: Your physician wants you to follow-up in: 6 months with Dr. Nahser.  You will receive a reminder letter in the mail two months in advance. If you don't receive a letter, please call our office to schedule the follow-up appointment.   If you need a refill on your cardiac medications before your next appointment, please call your pharmacy.   Thank you for choosing [facility]! Gissella Niblack, RN [phone]    

## 2015-07-27 DIAGNOSIS — L718 Other rosacea: Secondary | ICD-10-CM | POA: Diagnosis not present

## 2015-07-27 DIAGNOSIS — D225 Melanocytic nevi of trunk: Secondary | ICD-10-CM | POA: Diagnosis not present

## 2015-07-27 DIAGNOSIS — L814 Other melanin hyperpigmentation: Secondary | ICD-10-CM | POA: Diagnosis not present

## 2015-07-27 DIAGNOSIS — L573 Poikiloderma of Civatte: Secondary | ICD-10-CM | POA: Diagnosis not present

## 2015-09-17 DIAGNOSIS — Z23 Encounter for immunization: Secondary | ICD-10-CM | POA: Diagnosis not present

## 2015-10-11 DIAGNOSIS — M722 Plantar fascial fibromatosis: Secondary | ICD-10-CM | POA: Diagnosis not present

## 2015-10-16 ENCOUNTER — Other Ambulatory Visit: Payer: BLUE CROSS/BLUE SHIELD

## 2015-12-13 ENCOUNTER — Other Ambulatory Visit: Payer: Self-pay | Admitting: Gynecology

## 2015-12-13 DIAGNOSIS — Z1231 Encounter for screening mammogram for malignant neoplasm of breast: Secondary | ICD-10-CM

## 2015-12-15 ENCOUNTER — Ambulatory Visit: Payer: BLUE CROSS/BLUE SHIELD

## 2016-01-01 ENCOUNTER — Ambulatory Visit (INDEPENDENT_AMBULATORY_CARE_PROVIDER_SITE_OTHER): Payer: BLUE CROSS/BLUE SHIELD | Admitting: Gynecology

## 2016-01-01 ENCOUNTER — Encounter: Payer: Self-pay | Admitting: Gynecology

## 2016-01-01 VITALS — BP 122/70 | Ht 66.0 in | Wt 168.0 lb

## 2016-01-01 DIAGNOSIS — G4700 Insomnia, unspecified: Secondary | ICD-10-CM

## 2016-01-01 DIAGNOSIS — R6882 Decreased libido: Secondary | ICD-10-CM | POA: Diagnosis not present

## 2016-01-01 DIAGNOSIS — Z79890 Hormone replacement therapy (postmenopausal): Secondary | ICD-10-CM

## 2016-01-01 DIAGNOSIS — Z01411 Encounter for gynecological examination (general) (routine) with abnormal findings: Secondary | ICD-10-CM | POA: Diagnosis not present

## 2016-01-01 DIAGNOSIS — R232 Flushing: Secondary | ICD-10-CM

## 2016-01-01 LAB — CBC WITH DIFFERENTIAL/PLATELET
Basophils Absolute: 0 cells/uL (ref 0–200)
Basophils Relative: 0 %
Eosinophils Absolute: 73 cells/uL (ref 15–500)
Eosinophils Relative: 1 %
HEMATOCRIT: 39.2 % (ref 35.0–45.0)
HEMOGLOBIN: 13 g/dL (ref 11.7–15.5)
LYMPHS ABS: 1533 {cells}/uL (ref 850–3900)
Lymphocytes Relative: 21 %
MCH: 30.2 pg (ref 27.0–33.0)
MCHC: 33.2 g/dL (ref 32.0–36.0)
MCV: 91 fL (ref 80.0–100.0)
MONO ABS: 657 {cells}/uL (ref 200–950)
MPV: 9.6 fL (ref 7.5–12.5)
Monocytes Relative: 9 %
NEUTROS PCT: 69 %
Neutro Abs: 5037 cells/uL (ref 1500–7800)
Platelets: 215 10*3/uL (ref 140–400)
RBC: 4.31 MIL/uL (ref 3.80–5.10)
RDW: 13.5 % (ref 11.0–15.0)
WBC: 7.3 10*3/uL (ref 3.8–10.8)

## 2016-01-01 MED ORDER — TRAZODONE HCL 300 MG PO TABS: 150.0000 mg | ORAL_TABLET | Freq: Every day | ORAL | 2 refills | Status: DC

## 2016-01-01 MED ORDER — PROGESTERONE MICRONIZED 200 MG PO CAPS
200.0000 mg | ORAL_CAPSULE | Freq: Every day | ORAL | 4 refills | Status: DC
Start: 2016-01-01 — End: 2017-01-01

## 2016-01-01 MED ORDER — ESTRADIOL 0.05 MG/24HR TD PTTW: 1.0000 | MEDICATED_PATCH | TRANSDERMAL | 12 refills | Status: DC

## 2016-01-01 NOTE — Patient Instructions (Signed)
Menopause and Hormone Replacement Therapy Introduction WHAT IS HORMONE REPLACEMENT THERAPY? Hormone replacement therapy (HRT) is the use of artificial (synthetic) hormones to replace hormones that your body stops producing during menopause. Menopause is the normal time of life when menstrual periods stop completely and the ovaries stop producing the female hormones estrogen and progesterone. This lack of hormones can affect your health and cause undesirable symptoms. HRT can relieve some of those symptoms. WHAT ARE MY OPTIONS FOR HRT? HRT may consist of the synthetic hormones estrogen and progestin, or it may consist of only estrogen (estrogen-only therapy). You and your health care provider will decide which form of HRT is best for you. If you choose to be on HRT and you have a uterus, estrogen and progestin are usually prescribed. Estrogen-only therapy is used for women who do not have a uterus. Possible options for taking HRT include:  Pills.  Patches.  Gels.  Sprays.  Vaginal cream.  Vaginal rings.  Vaginal inserts. The amount of hormone(s) that you take and how long you take the hormone(s) varies depending on your individual health. It is important to:  Begin HRT with the lowest possible dosage.  Stop HRT as soon as your health care provider tells you to stop.  Work with your health care provider so that you feel informed and comfortable with your decisions. WHAT ARE THE BENEFITS OF HRT? HRT can reduce the frequency and severity of menopausal symptoms. Benefits of HRT vary depending on the menopausal symptoms that you have, the severity of your symptoms, and your overall health. HRT may help to improve the following menopausal symptoms:  Hot flashes and night sweats. These are sudden feelings of heat that spread over the face and body. The skin may turn red, like a blush. Night sweats are hot flashes that happen while you are sleeping or trying to sleep.  Bone loss  (osteoporosis). The body loses calcium more quickly after menopause, causing the bones to become weaker. This can increase the risk for bone breaks (fractures).  Vaginal dryness. The lining of the vagina can become thin and dry, which can cause pain during sexual intercourse or cause infection, burning, or itching.  Urinary tract infections.  Urinary incontinence. This is a decreased ability to control when you urinate.  Irritability.  Short-term memory problems. WHAT ARE THE RISKS OF HRT? Risks of HRT vary depending on your individual health and medical history. Risks of HRT also depend on whether you receive both estrogen and progestin or you receive estrogen only.HRT may increase the risk of:  Spotting. This is when a small amount of bloodleaks from the vagina unexpectedly.  Endometrial cancer. This cancer is in the lining of the uterus (endometrium).  Breast cancer.  Increased density of breast tissue. This can make it harder to find breast cancer on a breast X-ray (mammogram).  Stroke.  Heart attack.  Blood clots.  Gallbladder disease. Risks of HRT can increase if you have any of the following conditions:  Endometrial cancer.  Liver disease.  Heart disease.  Breast cancer.  History of blood clots.  History of stroke. HOW SHOULD I CARE FOR MYSELF WHILE I AM ON HRT?  Take over-the-counter and prescription medicines only as told by your health care provider.  Get mammograms, pelvic exams, and medical checkups as often as told by your health care provider.  Have Pap tests done as often as told by your health care provider. A Pap test is sometimes called a Pap smear. It is a  screening test that is used to check for signs of cancer of the cervix and vagina. A Pap test can also identify the presence of infection or precancerous changes. Pap tests may be done:  Every 3 years, starting at age 41.  Every 5 years, starting after age 3, in combination with testing for  human papillomavirus (HPV).  More often or less often depending on other medical conditions you have, your age, and other risk factors.  It is your responsibility to get your Pap test results. Ask your health care provider or the department performing the test when your results will be ready.  Keep all follow-up visits as told by your health care provider. This is important. WHEN SHOULD I SEEK MEDICAL CARE? Talk with your health care provider if:  You have any of these:  Pain or swelling in your legs.  Shortness of breath.  Chest pain.  Lumps or changes in your breasts or armpits.  Slurred speech.  Pain, burning, or bleeding when you urine.  You develop any of these:  Unusual vaginal bleeding.  Dizziness or headaches.  Weakness or numbness in any part of your arms or legs.  Pain in your abdomen. This information is not intended to replace advice given to you by your health care provider. Make sure you discuss any questions you have with your health care provider. Document Released: 10/20/2002 Document Revised: 06/29/2015 Document Reviewed: 07/25/2014  2017 Elsevier Menopause Menopause is the normal time of life when menstrual periods stop completely. Menopause is complete when you have missed 12 consecutive menstrual periods. It usually occurs between the ages of 10 years and 22 years. Very rarely does a woman develop menopause before the age of 18 years. At menopause, your ovaries stop producing the female hormones estrogen and progesterone. This can cause undesirable symptoms and also affect your health. Sometimes the symptoms may occur 4-5 years before the menopause begins. There is no relationship between menopause and:  Oral contraceptives.  Number of children you had.  Race.  The age your menstrual periods started (menarche). Heavy smokers and very thin women may develop menopause earlier in life. What are the causes?  The ovaries stop producing the female  hormones estrogen and progesterone. Other causes include:  Surgery to remove both ovaries.  The ovaries stop functioning for no known reason.  Tumors of the pituitary gland in the brain.  Medical disease that affects the ovaries and hormone production.  Radiation treatment to the abdomen or pelvis.  Chemotherapy that affects the ovaries. What are the signs or symptoms?  Hot flashes.  Night sweats.  Decrease in sex drive.  Vaginal dryness and thinning of the vagina causing painful intercourse.  Dryness of the skin and developing wrinkles.  Headaches.  Tiredness.  Irritability.  Memory problems.  Weight gain.  Bladder infections.  Hair growth of the face and chest.  Infertility. More serious symptoms include:  Loss of bone (osteoporosis) causing breaks (fractures).  Depression.  Hardening and narrowing of the arteries (atherosclerosis) causing heart attacks and strokes. How is this diagnosed?  When the menstrual periods have stopped for 12 straight months.  Physical exam.  Hormone studies of the blood. How is this treated? There are many treatment choices and nearly as many questions about them. The decisions to treat or not to treat menopausal changes is an individual choice made with your health care provider. Your health care provider can discuss the treatments with you. Together, you can decide which treatment will work best  for you. Your treatment choices may include:  Hormone therapy (estrogen and progesterone).  Non-hormonal medicines.  Treating the individual symptoms with medicine (for example antidepressants for depression).  Herbal medicines that may help specific symptoms.  Counseling by a psychiatrist or psychologist.  Group therapy.  Lifestyle changes including:  Eating healthy.  Regular exercise.  Limiting caffeine and alcohol.  Stress management and meditation.  No treatment. Follow these instructions at home:  Take the  medicine your health care provider gives you as directed.  Get plenty of sleep and rest.  Exercise regularly.  Eat a diet that contains calcium (good for the bones) and soy products (acts like estrogen hormone).  Avoid alcoholic beverages.  Do not smoke.  If you have hot flashes, dress in layers.  Take supplements, calcium, and vitamin D to strengthen bones.  You can use over-the-counter lubricants or moisturizers for vaginal dryness.  Group therapy is sometimes very helpful.  Acupuncture may be helpful in some cases. Contact a health care provider if:  You are not sure you are in menopause.  You are having menopausal symptoms and need advice and treatment.  You are still having menstrual periods after age 35 years.  You have pain with intercourse.  Menopause is complete (no menstrual period for 12 months) and you develop vaginal bleeding.  You need a referral to a specialist (gynecologist, psychiatrist, or psychologist) for treatment. Get help right away if:  You have severe depression.  You have excessive vaginal bleeding.  You fell and think you have a broken bone.  You have pain when you urinate.  You develop leg or chest pain.  You have a fast pounding heart beat (palpitations).  You have severe headaches.  You develop vision problems.  You feel a lump in your breast.  You have abdominal pain or severe indigestion. This information is not intended to replace advice given to you by your health care provider. Make sure you discuss any questions you have with your health care provider. Document Released: 04/13/2003 Document Revised: 06/29/2015 Document Reviewed: 08/20/2012 Elsevier Interactive Patient Education  2017 [employer].

## 2016-01-01 NOTE — Progress Notes (Signed)
Nancy Sparks 26-Jun-1965 [id number]   History:    50 y.o.  for annual gyn exam with her complaining of vaginal dryness, dyspareunia, decreased libido along with her insomnia and hot flashes. Patient's flu vaccine is up-to-date. Patient has been amenorrheic to the fact that she has prior history of endometrial ablation. Patient with no past history of any abnormal Pap smear. Patient's sister with history of colon cancer and patient had a colonoscopy in 2017 and benign polyps was removed and she is on a 5 year recall. Patient's cardiologist Dr. Katharina Caper has been monitoring her hyperlipidemia.  Past medical history,surgical history, family history and social history were all reviewed and documented in the EPIC chart.  Gynecologic History No LMP recorded. Patient has had an ablation. Contraception: tubal ligation Last Pap: 2015. Results were: normal Last mammogram: 2013. Results were: Normal but dense  Obstetric History OB History  Gravida Para Term Preterm AB Living  4 3 3   1 3   SAB TAB Ectopic Multiple Live Births               # Outcome Date GA Lbr Len/2nd Weight Sex Delivery Anes PTL Lv  4 AB           3 Term           2 Term           1 Term                ROS: A ROS was performed and pertinent positives and negatives are included in the history.  GENERAL: No fevers or chills. HEENT: No change in vision, no earache, sore throat or sinus congestion. NECK: No pain or stiffness. CARDIOVASCULAR: No chest pain or pressure. No palpitations. PULMONARY: No shortness of breath, cough or wheeze. GASTROINTESTINAL: No abdominal pain, nausea, vomiting or diarrhea, melena or bright red blood per rectum. GENITOURINARY: No urinary frequency, urgency, hesitancy or dysuria. MUSCULOSKELETAL: No joint or muscle pain, no back pain, no recent trauma. DERMATOLOGIC: No rash, no itching, no lesions. ENDOCRINE: No polyuria, polydipsia, no heat or cold intolerance. No recent change in weight. HEMATOLOGICAL:  No anemia or easy bruising or bleeding. NEUROLOGIC: No headache, seizures, numbness, tingling or weakness. PSYCHIATRIC: No depression, no loss of interest in normal activity or change in sleep pattern.     Exam: chaperone present  BP 122/70   Ht 5\' 6"  (1.676 m)   Wt 168 lb (76.2 kg)   BMI 27.12 kg/m   Body mass index is 27.12 kg/m.  General appearance : Well developed well nourished female. No acute distress HEENT: Eyes: no retinal hemorrhage or exudates,  Neck supple, trachea midline, no carotid bruits, no thyroidmegaly Lungs: Clear to auscultation, no rhonchi or wheezes, or rib retractions  Heart: Regular rate and rhythm, no murmurs or gallops Breast:Examined in sitting and supine position were symmetrical in appearance, no palpable masses or tenderness,  no skin retraction, no nipple inversion, no nipple discharge, no skin discoloration, no axillary or supraclavicular lymphadenopathy Abdomen: no palpable masses or tenderness, no rebound or guarding Extremities: no edema or skin discoloration or tenderness  Pelvic:  Bartholin, Urethra, Skene Glands: Within normal limits             Vagina: No gross lesions or discharge  Cervix: No gross lesions or discharge  Uterus  anteverted, normal size, shape and consistency, non-tender and mobile  Adnexa  Without masses or tenderness  Anus and perineum  normal   Rectovaginal  normal sphincter tone without palpated masses or tenderness             Hemoccult colonoscopy this year benign polyps removed     Assessment/Plan:  50 y.o. female for annual exam with signs and symptoms of the menopause. An Bill[place] will be drawn today along with a TSH. Also a CBC. Her PCP has been doing the remainder of her blood work done this year. We had a lengthy discussion today of the menopause as well as the pathophysiology. We also discussed the women's health initiative study as well as most recent guidelines on hormone replacement therapy. The risks benefits and  pros and cons were discussed include the risk of DVT, pulmonary embolism and breast cancer. We also discussed different treatment regimens from oral to transdermal gels and patches to vaginal rings. Patient is ready to move forward to improve her quality of life. She is going to be started on Vivelle-Dot 0.05 twice a week with the addition of oral Prometrium 200 mg for the first hole days of the month. Pap smear not indicated this year. Patient scheduled for mammogram next month. Patient will call tomorrow if her [facility] is confirmed to be in the menopausal range she'll begin the above hormonal replacement therapy regimen prescribed. We also discussed importance of calcium vitamin D and weightbearing exercises for osteoporosis prevention.  An additional 15 minutes was spent discussing the menopause as well as hormone replacement therapy.   Terrance Mass MD, 3:44 PM 01/01/2016

## 2016-01-02 LAB — FOLLICLE STIMULATING HORMONE: FSH: 26 m[IU]/mL

## 2016-01-02 LAB — TSH: TSH: 2.59 mIU/L

## 2016-01-15 ENCOUNTER — Ambulatory Visit
Admission: RE | Admit: 2016-01-15 | Discharge: 2016-01-15 | Disposition: A | Discharge status: Home / Self Care | Payer: BLUE CROSS/BLUE SHIELD | Source: Ambulatory Visit | Attending: Gynecology | Admitting: Gynecology

## 2016-01-15 DIAGNOSIS — Z1231 Encounter for screening mammogram for malignant neoplasm of breast: Secondary | ICD-10-CM

## 2016-03-26 ENCOUNTER — Other Ambulatory Visit: Payer: Self-pay | Admitting: Cardiovascular Disease

## 2016-03-27 ENCOUNTER — Other Ambulatory Visit: Payer: Self-pay | Admitting: Cardiovascular Disease

## 2016-03-28 ENCOUNTER — Other Ambulatory Visit: Payer: Self-pay | Admitting: Cardiovascular Disease

## 2016-03-28 MED ORDER — ROSUVASTATIN CALCIUM 10 MG PO TABS: 10.0000 mg | ORAL_TABLET | Freq: Every day | ORAL | 3 refills | Status: DC

## 2016-06-06 DIAGNOSIS — M5417 Radiculopathy, lumbosacral region: Secondary | ICD-10-CM | POA: Diagnosis not present

## 2016-06-06 DIAGNOSIS — M9903 Segmental and somatic dysfunction of lumbar region: Secondary | ICD-10-CM | POA: Diagnosis not present

## 2016-06-06 DIAGNOSIS — M5386 Other specified dorsopathies, lumbar region: Secondary | ICD-10-CM | POA: Diagnosis not present

## 2016-06-06 DIAGNOSIS — M9902 Segmental and somatic dysfunction of thoracic region: Secondary | ICD-10-CM | POA: Diagnosis not present

## 2016-06-10 ENCOUNTER — Telehealth: Payer: Self-pay | Admitting: *Deleted

## 2016-06-10 DIAGNOSIS — M5386 Other specified dorsopathies, lumbar region: Secondary | ICD-10-CM | POA: Diagnosis not present

## 2016-06-10 DIAGNOSIS — M9903 Segmental and somatic dysfunction of lumbar region: Secondary | ICD-10-CM | POA: Diagnosis not present

## 2016-06-10 DIAGNOSIS — M9902 Segmental and somatic dysfunction of thoracic region: Secondary | ICD-10-CM | POA: Diagnosis not present

## 2016-06-10 DIAGNOSIS — M5417 Radiculopathy, lumbosacral region: Secondary | ICD-10-CM | POA: Diagnosis not present

## 2016-06-10 MED ORDER — TRAZODONE HCL 300 MG PO TABS: 150.0000 mg | ORAL_TABLET | Freq: Every day | ORAL | 5 refills | Status: DC

## 2016-06-10 NOTE — Telephone Encounter (Signed)
Rx sent 

## 2016-06-10 NOTE — Telephone Encounter (Signed)
30 tablets 5 refills

## 2016-06-10 NOTE — Telephone Encounter (Signed)
Pt was prescribed trazodone 300 mg tablet in Nov 2017 with only 2 refills, pt said she thought you were going to refill x 1 year? Pt said the MD that use prescribe has moved out of [place]. Please advise

## 2016-06-11 DIAGNOSIS — M5417 Radiculopathy, lumbosacral region: Secondary | ICD-10-CM | POA: Diagnosis not present

## 2016-06-11 DIAGNOSIS — M9902 Segmental and somatic dysfunction of thoracic region: Secondary | ICD-10-CM | POA: Diagnosis not present

## 2016-06-11 DIAGNOSIS — M5386 Other specified dorsopathies, lumbar region: Secondary | ICD-10-CM | POA: Diagnosis not present

## 2016-06-11 DIAGNOSIS — M9903 Segmental and somatic dysfunction of lumbar region: Secondary | ICD-10-CM | POA: Diagnosis not present

## 2016-06-19 ENCOUNTER — Encounter: Payer: Self-pay | Admitting: Gynecology

## 2017-01-01 ENCOUNTER — Other Ambulatory Visit: Payer: Self-pay

## 2017-01-01 MED ORDER — PROGESTERONE MICRONIZED 200 MG PO CAPS: 200.0000 mg | ORAL_CAPSULE | Freq: Every day | ORAL | 0 refills | Status: DC

## 2017-01-01 MED ORDER — ESTRADIOL 0.05 MG/24HR TD PTTW: 1.0000 | MEDICATED_PATCH | TRANSDERMAL | 1 refills | Status: DC

## 2017-02-07 ENCOUNTER — Telehealth: Payer: Self-pay | Admitting: *Deleted

## 2017-02-07 MED ORDER — TRAZODONE HCL 300 MG PO TABS: 150.0000 mg | ORAL_TABLET | Freq: Every day | ORAL | 0 refills | Status: DC

## 2017-02-07 NOTE — Telephone Encounter (Signed)
I think she takes a half a tablet at bedtime as needed? Please call and review okay for refill but best not to take daily, addictive. (Dr. Toney Rakes had prescribed.) Please give her our condolences on the death of her friends father.

## 2017-02-07 NOTE — Telephone Encounter (Signed)
Pt had to reschedule annual exam due to death of a friends' father, she plans to attend the funeral. annual now scheduled 03/18/17. Needs refill on trazodone 300 mg tablets. Please advise

## 2017-02-07 NOTE — Telephone Encounter (Signed)
Spoke with patient and she is taking daily, states not able to sleep without Rx. Rx sent.

## 2017-02-07 NOTE — Telephone Encounter (Signed)
Left message for pt to call.

## 2017-02-10 ENCOUNTER — Encounter: Payer: BLUE CROSS/BLUE SHIELD | Admitting: Women's Health

## 2017-03-18 ENCOUNTER — Ambulatory Visit: Payer: BLUE CROSS/BLUE SHIELD | Admitting: Women's Health

## 2017-03-18 ENCOUNTER — Encounter: Payer: Self-pay | Admitting: Women's Health

## 2017-03-18 VITALS — BP 140/82 | Ht 66.0 in | Wt 174.0 lb

## 2017-03-18 DIAGNOSIS — Z01419 Encounter for gynecological examination (general) (routine) without abnormal findings: Secondary | ICD-10-CM | POA: Diagnosis not present

## 2017-03-18 DIAGNOSIS — R5383 Other fatigue: Secondary | ICD-10-CM | POA: Diagnosis not present

## 2017-03-18 MED ORDER — PROGESTERONE MICRONIZED 200 MG PO CAPS: 200.0000 mg | ORAL_CAPSULE | Freq: Every day | ORAL | 4 refills | Status: DC

## 2017-03-18 MED ORDER — TRAZODONE HCL 300 MG PO TABS
300.0000 mg | ORAL_TABLET | Freq: Every day | ORAL | 4 refills | Status: DC
Start: 2017-03-18 — End: 2017-08-12

## 2017-03-18 MED ORDER — ESTRADIOL 0.05 MG/24HR TD PTTW: 1.0000 | MEDICATED_PATCH | TRANSDERMAL | 4 refills | Status: DC

## 2017-03-18 MED ORDER — TRAZODONE HCL 300 MG PO TABS: 300.0000 mg | ORAL_TABLET | Freq: Every day | ORAL | 4 refills | Status: DC

## 2017-03-18 NOTE — Patient Instructions (Signed)
Health Maintenance for Postmenopausal Women Menopause is a normal process in which your reproductive ability comes to an end. This process happens gradually over a span of months to years, usually between the ages of 27 and 105. Menopause is complete when you have missed 12 consecutive menstrual periods. It is important to talk with your health care provider about some of the most common conditions that affect postmenopausal women, such as heart disease, cancer, and bone loss (osteoporosis). Adopting a healthy lifestyle and getting preventive care can help to promote your health and wellness. Those actions can also lower your chances of developing some of these common conditions. What should I know about menopause? During menopause, you may experience a number of symptoms, such as:  Moderate-to-severe hot flashes.  Night sweats.  Decrease in sex drive.  Mood swings.  Headaches.  Tiredness.  Irritability.  Memory problems.  Insomnia.  Choosing to treat or not to treat menopausal changes is an individual decision that you make with your health care provider. What should I know about hormone replacement therapy and supplements? Hormone therapy products are effective for treating symptoms that are associated with menopause, such as hot flashes and night sweats. Hormone replacement carries certain risks, especially as you become older. If you are thinking about using estrogen or estrogen with progestin treatments, discuss the benefits and risks with your health care provider. What should I know about heart disease and stroke? Heart disease, heart attack, and stroke become more likely as you age. This may be due, in part, to the hormonal changes that your body experiences during menopause. These can affect how your body processes dietary fats, triglycerides, and cholesterol. Heart attack and stroke are both medical emergencies. There are many things that you can do to help prevent heart disease  and stroke:  Have your blood pressure checked at least every 1-2 years. High blood pressure causes heart disease and increases the risk of stroke.  If you are 5-13 years old, ask your health care provider if you should take aspirin to prevent a heart attack or a stroke.  Do not use any tobacco products, including cigarettes, chewing tobacco, or electronic cigarettes. If you need help quitting, ask your health care provider.  It is important to eat a healthy diet and maintain a healthy weight. ? Be sure to include plenty of vegetables, fruits, low-fat dairy products, and lean protein. ? Avoid eating foods that are high in solid fats, added sugars, or salt (sodium).  Get regular exercise. This is one of the most important things that you can do for your health. ? Try to exercise for at least 150 minutes each week. The type of exercise that you do should increase your heart rate and make you sweat. This is known as moderate-intensity exercise. ? Try to do strengthening exercises at least twice each week. Do these in addition to the moderate-intensity exercise.  Know your numbers.Ask your health care provider to check your cholesterol and your blood glucose. Continue to have your blood tested as directed by your health care provider.  What should I know about cancer screening? There are several types of cancer. Take the following steps to reduce your risk and to catch any cancer development as early as possible. Breast Cancer  Practice breast self-awareness. ? This means understanding how your breasts normally appear and feel. ? It also means doing regular breast self-exams. Let your health care provider know about any changes, no matter how small.  If you are 40  or older, have a clinician do a breast exam (clinical breast exam or CBE) every year. Depending on your age, family history, and medical history, it may be recommended that you also have a yearly breast X-ray (mammogram).  If you  have a family history of breast cancer, talk with your health care provider about genetic screening.  If you are at high risk for breast cancer, talk with your health care provider about having an MRI and a mammogram every year.  Breast cancer (BRCA) gene test is recommended for women who have family members with BRCA-related cancers. Results of the assessment will determine the need for genetic counseling and BRCA1 and for BRCA2 testing. BRCA-related cancers include these types: ? Breast. This occurs in males or females. ? Ovarian. ? Tubal. This may also be called fallopian tube cancer. ? Cancer of the abdominal or pelvic lining (peritoneal cancer). ? Prostate. ? Pancreatic.  Cervical, Uterine, and Ovarian Cancer Your health care provider may recommend that you be screened regularly for cancer of the pelvic organs. These include your ovaries, uterus, and vagina. This screening involves a pelvic exam, which includes checking for microscopic changes to the surface of your cervix (Pap test).  For women ages 21-65, health care providers may recommend a pelvic exam and a Pap test every three years. For women ages 79-65, they may recommend the Pap test and pelvic exam, combined with testing for human papilloma virus (HPV), every five years. Some types of HPV increase your risk of cervical cancer. Testing for HPV may also be done on women of any age who have unclear Pap test results.  Other health care providers may not recommend any screening for nonpregnant women who are considered low risk for pelvic cancer and have no symptoms. Ask your health care provider if a screening pelvic exam is right for you.  If you have had past treatment for cervical cancer or a condition that could lead to cancer, you need Pap tests and screening for cancer for at least 20 years after your treatment. If Pap tests have been discontinued for you, your risk factors (such as having a new sexual partner) need to be  reassessed to determine if you should start having screenings again. Some women have medical problems that increase the chance of getting cervical cancer. In these cases, your health care provider may recommend that you have screening and Pap tests more often.  If you have a family history of uterine cancer or ovarian cancer, talk with your health care provider about genetic screening.  If you have vaginal bleeding after reaching menopause, tell your health care provider.  There are currently no reliable tests available to screen for ovarian cancer.  Lung Cancer Lung cancer screening is recommended for adults 69-62 years old who are at high risk for lung cancer because of a history of smoking. A yearly low-dose CT scan of the lungs is recommended if you:  Currently smoke.  Have a history of at least 30 pack-years of smoking and you currently smoke or have quit within the past 15 years. A pack-year is smoking an average of one pack of cigarettes per day for one year.  Yearly screening should:  Continue until it has been 15 years since you quit.  Stop if you develop a health problem that would prevent you from having lung cancer treatment.  Colorectal Cancer  This type of cancer can be detected and can often be prevented.  Routine colorectal cancer screening usually begins at  age 42 and continues through age 45.  If you have risk factors for colon cancer, your health care provider may recommend that you be screened at an earlier age.  If you have a family history of colorectal cancer, talk with your health care provider about genetic screening.  Your health care provider may also recommend using home test kits to check for hidden blood in your stool.  A small camera at the end of a tube can be used to examine your colon directly (sigmoidoscopy or colonoscopy). This is done to check for the earliest forms of colorectal cancer.  Direct examination of the colon should be repeated every  5-10 years until age 71. However, if early forms of precancerous polyps or small growths are found or if you have a family history or genetic risk for colorectal cancer, you may need to be screened more often.  Skin Cancer  Check your skin from head to toe regularly.  Monitor any moles. Be sure to tell your health care provider: ? About any new moles or changes in moles, especially if there is a change in a mole's shape or color. ? If you have a mole that is larger than the size of a pencil eraser.  If any of your family members has a history of skin cancer, especially at a young age, talk with your health care provider about genetic screening.  Always use sunscreen. Apply sunscreen liberally and repeatedly throughout the day.  Whenever you are outside, protect yourself by wearing long sleeves, pants, a wide-brimmed hat, and sunglasses.  What should I know about osteoporosis? Osteoporosis is a condition in which bone destruction happens more quickly than new bone creation. After menopause, you may be at an increased risk for osteoporosis. To help prevent osteoporosis or the bone fractures that can happen because of osteoporosis, the following is recommended:  If you are 46-71 years old, get at least 1,000 mg of calcium and at least 600 mg of vitamin D per day.  If you are older than age 55 but younger than age 65, get at least 1,200 mg of calcium and at least 600 mg of vitamin D per day.  If you are older than age 54, get at least 1,200 mg of calcium and at least 800 mg of vitamin D per day.  Smoking and excessive alcohol intake increase the risk of osteoporosis. Eat foods that are rich in calcium and vitamin D, and do weight-bearing exercises several times each week as directed by your health care provider. What should I know about how menopause affects my mental health? Depression may occur at any age, but it is more common as you become older. Common symptoms of depression  include:  Low or sad mood.  Changes in sleep patterns.  Changes in appetite or eating patterns.  Feeling an overall lack of motivation or enjoyment of activities that you previously enjoyed.  Frequent crying spells.  Talk with your health care provider if you think that you are experiencing depression. What should I know about immunizations? It is important that you get and maintain your immunizations. These include:  Tetanus, diphtheria, and pertussis (Tdap) booster vaccine.  Influenza every year before the flu season begins.  Pneumonia vaccine.  Shingles vaccine.  Your health care provider may also recommend other immunizations. This information is not intended to replace advice given to you by your health care provider. Make sure you discuss any questions you have with your health care provider. Document Released: 03/15/2005  Document Revised: 08/11/2015 Document Reviewed: 10/25/2014 Elsevier Interactive Patient Education  [employer].

## 2017-03-18 NOTE — Progress Notes (Signed)
Nancy Sparks 11/22/1965 [id number]    History:    Presents for annual exam. Postmenopausal on HRT with no bleeding. 2017 benign colon polyp 5 year follow-up. Normal Pap and mammogram history. Trazodone to sleep. Hypercholesteremia managed by primary care.  Past medical history, past surgical history, family history and social history were all reviewed and documented in the EPIC chart. Homemaker, retired [employer]. Daughters ages 7 and 52, son 64 all doing well. 2 sisters and  neice hypothyroid  ROS:  A ROS was performed and pertinent positives and negatives are included.  Exam:  Vitals:   03/18/17 1434  BP: 140/82  Weight: 174 lb (78.9 kg)  Height: 5\' 6"  (1.676 m)   Body mass index is 28.08 kg/m.   General appearance:  Normal Thyroid:  Symmetrical, normal in size, without palpable masses or nodularity. Respiratory  Auscultation:  Clear without wheezing or rhonchi Cardiovascular  Auscultation:  Regular rate, without rubs, murmurs or gallops  Edema/varicosities:  Not grossly evident Abdominal  Soft,nontender, without masses, guarding or rebound.  Liver/spleen:  No organomegaly noted  Hernia:  None appreciated  Skin  Inspection:  Grossly normal, lipoma left buttock/ years no change   Breasts: Examined lying and sitting.     Right: Without masses, retractions, discharge or axillary adenopathy.     Left: Without masses, retractions, discharge or axillary adenopathy. Gentitourinary   Inguinal/mons:  Normal without inguinal adenopathy  External genitalia:  Normal  BUS/Urethra/Skene's glands:  Normal  Vagina:  Normal  Cervix:  Normal  Uterus:  normal in size, shape and contour.  Midline and mobile  Adnexa/parametria:     Rt: Without masses or tenderness.   Lt: Without masses or tenderness.  Anus and perineum: Normal  Digital rectal exam: Normal sphincter tone without palpated masses or tenderness  Assessment/Plan:  52 y.o. M WF G4 P3 for annual exam with  no complaints.  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 Prescription trazodone 300 mg half tablet at bedtime when necessary. Sleep hygiene reviewed.  Vivelle Dot 0.05 patch twice weekly, Prometrium 200 mg day one through 12 of each month..Has been on for one year reviewed to use shortest amount of time states is doing much better on would like to continue. Reviewed risks of blood clots, strokes and breast cancer. SBE's, overdue for mammogram and instructed to schedule. Continue active lifestyle with regular exercise, tennis, calcium rich diet, vitamin D 2000 daily encouraged. TSH, Pap with HR HPV typing, new screening guidelines reviewed.    [place], 4:00 PM 03/18/2017

## 2017-03-19 ENCOUNTER — Encounter: Payer: Self-pay | Admitting: Women's Health

## 2017-03-19 LAB — TSH: TSH: 1.97 mIU/L

## 2017-03-21 LAB — PAP, TP IMAGING W/ HPV RNA, RFLX HPV TYPE 16,18/45: HPV DNA HIGH RISK: NOT DETECTED

## 2017-07-15 DIAGNOSIS — Z0000 Encounter for general adult medical examination without abnormal findings: Secondary | ICD-10-CM | POA: Diagnosis not present

## 2017-07-15 DIAGNOSIS — R7301 Impaired fasting glucose: Secondary | ICD-10-CM | POA: Diagnosis not present

## 2017-07-15 DIAGNOSIS — Z8632 Personal history of gestational diabetes: Secondary | ICD-10-CM | POA: Insufficient documentation

## 2017-07-15 DIAGNOSIS — D126 Benign neoplasm of colon, unspecified: Secondary | ICD-10-CM | POA: Insufficient documentation

## 2017-07-15 DIAGNOSIS — E663 Overweight: Secondary | ICD-10-CM | POA: Insufficient documentation

## 2017-07-15 DIAGNOSIS — E7800 Pure hypercholesterolemia, unspecified: Secondary | ICD-10-CM | POA: Diagnosis not present

## 2017-07-15 DIAGNOSIS — Z79890 Hormone replacement therapy (postmenopausal): Secondary | ICD-10-CM | POA: Insufficient documentation

## 2017-07-15 DIAGNOSIS — Z800 Family history of malignant neoplasm of digestive organs: Secondary | ICD-10-CM | POA: Diagnosis not present

## 2017-07-15 DIAGNOSIS — K219 Gastro-esophageal reflux disease without esophagitis: Secondary | ICD-10-CM | POA: Insufficient documentation

## 2017-07-15 DIAGNOSIS — R82998 Other abnormal findings in urine: Secondary | ICD-10-CM | POA: Diagnosis not present

## 2017-07-24 ENCOUNTER — Emergency Department (HOSPITAL_COMMUNITY): Payer: BLUE CROSS/BLUE SHIELD

## 2017-07-24 ENCOUNTER — Inpatient Hospital Stay (HOSPITAL_COMMUNITY)
Admission: EM | Admit: 2017-07-24 | Discharge: 2017-07-29 | DRG: 247 | Disposition: A | Discharge status: Home / Self Care | Payer: BLUE CROSS/BLUE SHIELD | Attending: Cardiovascular Disease | Admitting: Cardiovascular Disease

## 2017-07-24 ENCOUNTER — Encounter (HOSPITAL_COMMUNITY): Payer: Self-pay | Admitting: Emergency Medicine

## 2017-07-24 ENCOUNTER — Other Ambulatory Visit: Payer: Self-pay

## 2017-07-24 DIAGNOSIS — I10 Essential (primary) hypertension: Secondary | ICD-10-CM | POA: Diagnosis present

## 2017-07-24 DIAGNOSIS — Z9114 Patient's other noncompliance with medication regimen: Secondary | ICD-10-CM | POA: Diagnosis not present

## 2017-07-24 DIAGNOSIS — Z86010 Personal history of colonic polyps: Secondary | ICD-10-CM | POA: Diagnosis not present

## 2017-07-24 DIAGNOSIS — F329 Major depressive disorder, single episode, unspecified: Secondary | ICD-10-CM | POA: Diagnosis not present

## 2017-07-24 DIAGNOSIS — Z79899 Other long term (current) drug therapy: Secondary | ICD-10-CM

## 2017-07-24 DIAGNOSIS — Z8249 Family history of ischemic heart disease and other diseases of the circulatory system: Secondary | ICD-10-CM

## 2017-07-24 DIAGNOSIS — Z79890 Hormone replacement therapy (postmenopausal): Secondary | ICD-10-CM | POA: Diagnosis not present

## 2017-07-24 DIAGNOSIS — E7801 Familial hypercholesterolemia: Secondary | ICD-10-CM | POA: Diagnosis present

## 2017-07-24 DIAGNOSIS — Z91040 Latex allergy status: Secondary | ICD-10-CM

## 2017-07-24 DIAGNOSIS — R072 Precordial pain: Secondary | ICD-10-CM | POA: Diagnosis not present

## 2017-07-24 DIAGNOSIS — R079 Chest pain, unspecified: Secondary | ICD-10-CM | POA: Diagnosis present

## 2017-07-24 DIAGNOSIS — Z955 Presence of coronary angioplasty implant and graft: Secondary | ICD-10-CM

## 2017-07-24 DIAGNOSIS — F419 Anxiety disorder, unspecified: Secondary | ICD-10-CM | POA: Diagnosis present

## 2017-07-24 DIAGNOSIS — I25119 Atherosclerotic heart disease of native coronary artery with unspecified angina pectoris: Secondary | ICD-10-CM

## 2017-07-24 DIAGNOSIS — E785 Hyperlipidemia, unspecified: Secondary | ICD-10-CM | POA: Diagnosis present

## 2017-07-24 DIAGNOSIS — Z91048 Other nonmedicinal substance allergy status: Secondary | ICD-10-CM | POA: Diagnosis not present

## 2017-07-24 DIAGNOSIS — K219 Gastro-esophageal reflux disease without esophagitis: Secondary | ICD-10-CM | POA: Diagnosis present

## 2017-07-24 DIAGNOSIS — I25118 Atherosclerotic heart disease of native coronary artery with other forms of angina pectoris: Secondary | ICD-10-CM | POA: Diagnosis not present

## 2017-07-24 DIAGNOSIS — I25110 Atherosclerotic heart disease of native coronary artery with unstable angina pectoris: Principal | ICD-10-CM | POA: Diagnosis present

## 2017-07-24 DIAGNOSIS — I200 Unstable angina: Secondary | ICD-10-CM | POA: Diagnosis not present

## 2017-07-24 DIAGNOSIS — E7800 Pure hypercholesterolemia, unspecified: Secondary | ICD-10-CM | POA: Diagnosis not present

## 2017-07-24 HISTORY — DX: Anemia, unspecified: D64.9

## 2017-07-24 HISTORY — DX: Cardiac murmur, unspecified: R01.1

## 2017-07-24 HISTORY — DX: Essential (primary) hypertension: I10

## 2017-07-24 LAB — CBC
HCT: 36.9 % (ref 36.0–46.0)
HEMATOCRIT: 38.4 % (ref 36.0–46.0)
HEMOGLOBIN: 12.2 g/dL (ref 12.0–15.0)
Hemoglobin: 12.8 g/dL (ref 12.0–15.0)
MCH: 31.1 pg (ref 26.0–34.0)
MCH: 30.8 pg (ref 26.0–34.0)
MCHC: 33.3 g/dL (ref 30.0–36.0)
MCHC: 33.1 g/dL (ref 30.0–36.0)
MCV: 93.4 fL (ref 78.0–100.0)
MCV: 93.2 fL (ref 78.0–100.0)
Platelets: 174 10*3/uL (ref 150–400)
Platelets: 182 10*3/uL (ref 150–400)
RBC: 4.11 MIL/uL (ref 3.87–5.11)
RBC: 3.96 MIL/uL (ref 3.87–5.11)
RDW: 12.1 % (ref 11.5–15.5)
RDW: 12.3 % (ref 11.5–15.5)
WBC: 5.5 10*3/uL (ref 4.0–10.5)
WBC: 5.4 10*3/uL (ref 4.0–10.5)

## 2017-07-24 LAB — BASIC METABOLIC PANEL
Anion gap: 7 (ref 5–15)
BUN: 15 mg/dL (ref 6–20)
CHLORIDE: 105 mmol/L (ref 101–111)
CO2: 27 mmol/L (ref 22–32)
Calcium: 10 mg/dL (ref 8.9–10.3)
Creatinine, Ser: 0.88 mg/dL (ref 0.44–1.00)
GFR calc non Af Amer: >60 mL/min (ref >60)
Glucose, Bld: 130 mg/dL — ABNORMAL HIGH (ref 65–99)
POTASSIUM: 3.8 mmol/L (ref 3.5–5.1)
SODIUM: 139 mmol/L (ref 135–145)

## 2017-07-24 LAB — I-STAT TROPONIN, ED
Troponin i, poc: 0 ng/mL (ref 0.00–0.08)
Troponin i, poc: 0.01 ng/mL (ref 0.00–0.08)

## 2017-07-24 LAB — I-STAT BETA HCG BLOOD, ED (MC, WL, AP ONLY)

## 2017-07-24 LAB — D-DIMER, QUANTITATIVE: D-Dimer, Quant: 2.93 ug/mL-FEU — ABNORMAL HIGH (ref 0.00–0.50)

## 2017-07-24 MED ORDER — NITROGLYCERIN 2 % TD OINT
1.0000 [in_us] | TOPICAL_OINTMENT | Freq: Once | TRANSDERMAL | Status: AC
  Administered 2017-07-24: 1 [in_us] via TOPICAL
  Filled 2017-07-24: qty 1

## 2017-07-24 MED ORDER — IOPAMIDOL (ISOVUE-370) INJECTION 76%
100.0000 mL | Freq: Once | INTRAVENOUS | Status: AC | PRN
  Administered 2017-07-24: 100 mL via INTRAVENOUS

## 2017-07-24 MED ORDER — ASPIRIN EC 81 MG PO TBEC
81.0000 mg | DELAYED_RELEASE_TABLET | Freq: Every day | ORAL | Status: DC
  Administered 2017-07-25 – 2017-07-29 (×4): 81 mg via ORAL
  Filled 2017-07-24 (×5): qty 1

## 2017-07-24 MED ORDER — ACETAMINOPHEN 325 MG PO TABS
650.0000 mg | ORAL_TABLET | Freq: Once | ORAL | Status: AC
  Administered 2017-07-24: 650 mg via ORAL
  Filled 2017-07-24: qty 2

## 2017-07-24 MED ORDER — ASPIRIN 81 MG PO CHEW
324.0000 mg | CHEWABLE_TABLET | Freq: Once | ORAL | Status: AC
  Administered 2017-07-24: 324 mg via ORAL
  Filled 2017-07-24: qty 4

## 2017-07-24 MED ORDER — ACETAMINOPHEN 325 MG PO TABS
650.0000 mg | ORAL_TABLET | Freq: Four times a day (QID) | ORAL | Status: DC | PRN
  Administered 2017-07-24 – 2017-07-26 (×4): 650 mg via ORAL
  Filled 2017-07-24 (×5): qty 2

## 2017-07-24 MED ORDER — ROSUVASTATIN CALCIUM 20 MG PO TABS
20.0000 mg | ORAL_TABLET | Freq: Every day | ORAL | Status: DC
  Administered 2017-07-24 – 2017-07-27 (×4): 20 mg via ORAL
  Filled 2017-07-24 (×4): qty 1

## 2017-07-24 MED ORDER — ONDANSETRON HCL 4 MG PO TABS: 4.0000 mg | ORAL_TABLET | Freq: Four times a day (QID) | ORAL | Status: DC | PRN

## 2017-07-24 MED ORDER — IOPAMIDOL (ISOVUE-370) INJECTION 76%
INTRAVENOUS | Status: AC
  Filled 2017-07-24: qty 50

## 2017-07-24 MED ORDER — TRAZODONE HCL 50 MG PO TABS
100.0000 mg | ORAL_TABLET | Freq: Every day | ORAL | Status: DC
Start: 2017-07-24 — End: 2017-07-29
  Administered 2017-07-24 – 2017-07-28 (×5): 100 mg via ORAL
  Filled 2017-07-24 (×4): qty 1
  Filled 2017-07-24: qty 2

## 2017-07-24 MED ORDER — ACETAMINOPHEN 650 MG RE SUPP: 650.0000 mg | Freq: Four times a day (QID) | RECTAL | Status: DC | PRN

## 2017-07-24 MED ORDER — ONDANSETRON HCL 4 MG/2ML IJ SOLN: 4.0000 mg | Freq: Four times a day (QID) | INTRAMUSCULAR | Status: DC | PRN

## 2017-07-24 MED ORDER — ACETAMINOPHEN 500 MG PO TABS: 1000.0000 mg | ORAL_TABLET | Freq: Once | ORAL | Status: DC

## 2017-07-24 MED ORDER — ENOXAPARIN SODIUM 40 MG/0.4ML ~~LOC~~ SOLN
40.0000 mg | Freq: Every day | SUBCUTANEOUS | Status: DC
  Administered 2017-07-25 – 2017-07-29 (×3): 40 mg via SUBCUTANEOUS
  Filled 2017-07-24 (×3): qty 0.4

## 2017-07-24 NOTE — ED Triage Notes (Signed)
Pt c/o chest pain that radiates through her neck to her jaw. Pain has been everyday lasting 5 minutes to 2 hours, seen by PCP started on prescription nexium, been taking for the last 10 days with no change. Pt stated she has started to feel dizzy with these episodes and had to stop while playing tennis today. Hx high cholesterol, taking crestor.

## 2017-07-24 NOTE — ED Provider Notes (Signed)
MSE was initiated and I personally evaluated the patient and placed orders (if any) at  4:50 PM on July 24, 2017.  The patient appears stable so that the remainder of the MSE may be completed by another provider.  Patient placed in Quick Look pathway, seen and evaluated   Chief Complaint:  CP  HPI:   On and off cp, reterosternal  Feels like heartburn, radiates into her jaw and left arm. On nexium by pcp,. Sxs worsening got dizzy at tennis. +high cholesterol. No hx of htn, but + today.   ROS: cp (one)  Physical Exam:   Gen: No distress  Neuro: Awake and Alert  Skin: Warm    Focused Exam: RRR   Initiation of care has begun. The patient has been counseled on the process, plan, and necessity for staying for the completion/evaluation, and the remainder of the medical screening examination    Margarita Mail, Hershal Coria 07/24/17 1654    Dorie Rank, MD 07/27/17 2025

## 2017-07-24 NOTE — Plan of Care (Signed)
  Problem: Clinical Measurements: Goal: Diagnostic test results will improve Outcome: Progressing   Problem: Clinical Measurements: Goal: Cardiovascular complication will be avoided Outcome: Progressing   Problem: Education: Goal: Knowledge of General Education information will improve Outcome: Progressing   Problem: Health Behavior/Discharge Planning: Goal: Ability to manage health-related needs will improve Outcome: Progressing

## 2017-07-24 NOTE — H&P (Signed)
History and Physical    Nancy Sparks ZOX:[id number] DOB: 08-30-1965 DOA: 07/24/2017  PCP: Haywood Pao, MD  Patient coming from: Home.  Chief Complaint: Chest pain.  HPI: Nancy Sparks is a 52 y.o. female with history of hyperlipidemia presents with complaint of chest pain.  Patient has been having chest pain off and on for last 2 weeks.  Chest pain is retrosternal radiating to her neck.  Last for 5 to 10 minutes each time.  Patient placed in his every day and has not been having any chest pain limiting the play.  Denies any nausea vomiting abdominal pain shortness of breath productive cough fever or chills.  Today her daughter checked her blood pressure was elevated at systolic 811.  This made her concerned and came to the ER.  ED Course: In the ER EKG was showing normal sinus rhythm.  Since d-dimer was elevated patient had a CT angiogram of the chest was negative for PE.  Patient is presently chest pain-free admitted for further observation.  Review of Systems: As per HPI, rest all negative.   Past Medical History:  Diagnosis Date  . Anxiety   . Complication of anesthesia    itching relieved with benadryl   . Depression   . Dyslipidemia   . Fibroid   . Heart palpitations    hx of  . Hx of adenomatous polyp of colon 12/24/2002    Past Surgical History:  Procedure Laterality Date  . CESAREAN SECTION     X3  . COLONOSCOPY    . LUMBAR LAMINECTOMY/DECOMPRESSION MICRODISCECTOMY  04/25/2011   Procedure: LUMBAR LAMINECTOMY/DECOMPRESSION MICRODISCECTOMY 1 LEVEL;  Surgeon: Elaina Hoops, MD;  Location: [place] [facility];  Service: Neurosurgery;  Laterality: Left;  Left Lumbar Three-Four Laminectomy and Diskectomy  . LUMBAR LAMINECTOMY/DECOMPRESSION MICRODISCECTOMY  04/26/2011   Procedure: LUMBAR LAMINECTOMY/DECOMPRESSION MICRODISCECTOMY 1 LEVEL;  Surgeon: Elaina Hoops, MD;  Location: [place] [facility];  Service: Neurosurgery;  Laterality: N/A;  Re-Exploration of Lumbar Wound and Evacuation  of Epidural Hematoma  . [place]  2006  . TUBAL LIGATION       reports that she has never smoked. She has never used smokeless tobacco. She reports that she drinks about 4.2 oz of alcohol per week. She reports that she does not use drugs.  Allergies  Allergen Reactions  . Latex Rash  . Tape Rash    Paper tape ok    Family History  Problem Relation Age of Onset  . Hyperlipidemia Mother   . Hypertension Mother   . Anesthesia problems Mother        nausea  . Coronary artery disease Father        CABGx3 age 33  . Hyperlipidemia Father   . Atrial fibrillation Father   . Hypertension Father   . Heart disease Father   . Diabetes Father   . Hyperlipidemia Sister        X62  . Cancer Sister        colon cancer  . Thyroid disease Sister   . Anesthesia problems Sister        nausea  . Colon cancer Sister 18  . Hyperlipidemia Brother        x2    Prior to Admission medications   Medication Sig Start Date End Date Taking? Authorizing Provider  estradiol (VIVELLE-DOT) 0.05 MG/24HR patch Place 1 patch (0.05 mg total) onto the skin 2 (two) times a week. 03/20/17  Yes Huel Cote, NP  ibuprofen (ADVIL,MOTRIN) 200 MG tablet Take 600-800 mg by mouth every 6 (six) hours as needed (pain).    Yes [provider]  rosuvastatin (CRESTOR) 20 MG tablet Take 20 mg by mouth at bedtime. 07/15/17  Yes [provider]  Tetrahydrozoline HCl (VISINE OP) Place 1 drop into both eyes daily as needed (dry eyes).   Yes [provider]  trazodone (DESYREL) 300 MG tablet Take 1 tablet (300 mg total) by mouth at bedtime. Take 1/2 tablet bedtime as needed Patient taking differently: Take 100 mg by mouth at bedtime. 1/3 tablet 03/18/17  Yes Young, Candiss Norse, NP  ALPRAZolam Duanne Moron) 0.25 MG tablet Take 1 tablet (0.25 mg total) by mouth at bedtime as needed for anxiety. Patient not taking: Reported on 07/24/2017 06/15/14   Terrance Mass, MD  progesterone (PROMETRIUM) 200 MG  capsule Take 1 capsule (200 mg total) by mouth daily. Take one tablet the first 12 days of the month Patient taking differently: Take 200 mg by mouth See admin instructions. Take one capsule (200 mg) by mouth daily at bedtime on first 12 days of the month, hold for remainder of month and then repeat on the 1st of the next month. 03/18/17   Huel Cote, NP  rosuvastatin (CRESTOR) 10 MG tablet Take 1 tablet (10 mg total) by mouth daily. Patient not taking: Reported on 07/24/2017 03/28/16   Nahser, Wonda Cheng, MD  calcium carbonate (OS-CAL) 600 MG TABS Take 600 mg by mouth 2 (two) times daily with meals.    04/24/11  [provider]    Physical Exam: Vitals:   07/24/17 2100 07/24/17 2115 07/24/17 2134 07/24/17 2154  BP: (!) 149/90 126/84 (!) 139/91 131/83  Pulse: 95 92 81 83  Resp: 15  15 18   Temp:    98 F (36.7 C)  TempSrc:    Oral  SpO2: 99% 97% 100% 96%  Height:    5\' 6"  (1.676 m)      Constitutional: Moderately built and nourished. Vitals:   07/24/17 2100 07/24/17 2115 07/24/17 2134 07/24/17 2154  BP: (!) 149/90 126/84 (!) 139/91 131/83  Pulse: 95 92 81 83  Resp: 15  15 18   Temp:    98 F (36.7 C)  TempSrc:    Oral  SpO2: 99% 97% 100% 96%  Height:    5\' 6"  (1.676 m)   Eyes: Anicteric no pallor. ENMT: No discharge from the ears eyes nose or mouth. Neck: No mass palpated no neck rigidity. Respiratory: No rhonchi or crepitations. Cardiovascular: S1-S2 heard no murmurs appreciated. Abdomen: Soft nontender bowel sounds present. Musculoskeletal: No edema.  No joint effusion. Skin: No rash. Neurologic: Alert awake oriented to time place and person.  Moves all extremities. Psychiatric: Appears normal per normal affect.   Labs on Admission: I have personally reviewed following labs and imaging studies  CBC: Recent Labs  Lab 07/24/17 1701  WBC 5.5  HGB 12.8  HCT 38.4  MCV 93.4  PLT 324   Basic Metabolic Panel: Recent Labs  Lab 07/24/17 1701  NA 139  K 3.8    CL 105  CO2 27  GLUCOSE 130*  BUN 15  CREATININE 0.88  CALCIUM 10.0   GFR: CrCl cannot be calculated (Unknown ideal weight.). Liver Function Tests: No results for input(s): AST, ALT, ALKPHOS, BILITOT, PROT, ALBUMIN in the last 168 hours. No results for input(s): LIPASE, AMYLASE in the last 168 hours. No results for input(s): AMMONIA in the last 168 hours. Coagulation Profile:  No results for input(s): INR, PROTIME in the last 168 hours. Cardiac Enzymes: No results for input(s): CKTOTAL, CKMB, CKMBINDEX, TROPONINI in the last 168 hours. BNP (last 3 results) No results for input(s): PROBNP in the last 8760 hours. HbA1C: No results for input(s): HGBA1C in the last 72 hours. CBG: No results for input(s): GLUCAP in the last 168 hours. Lipid Profile: No results for input(s): CHOL, HDL, LDLCALC, TRIG, CHOLHDL, LDLDIRECT in the last 72 hours. Thyroid Function Tests: No results for input(s): TSH, T4TOTAL, FREET4, T3FREE, THYROIDAB in the last 72 hours. Anemia Panel: No results for input(s): VITAMINB12, FOLATE, FERRITIN, TIBC, IRON, RETICCTPCT in the last 72 hours. Urine analysis:    Component Value Date/Time   COLORURINE YELLOW 11/05/2013 [place] 11/05/2013 0852   LABSPEC 1.028 11/05/2013 0852   PHURINE 6.0 11/05/2013 0852   GLUCOSEU NEG 11/05/2013 0852   HGBUR NEG 11/05/2013 0852   BILIRUBINUR NEG 11/05/2013 0852   KETONESUR NEG 11/05/2013 0852   PROTEINUR NEG 11/05/2013 0852   UROBILINOGEN 0.2 11/05/2013 0852   NITRITE NEG 11/05/2013 0852   LEUKOCYTESUR NEG 11/05/2013 0852   Sepsis Labs: @LABRCNTIP (procalcitonin:4,lacticidven:4) )No results found for this or any previous visit (from the past 240 hour(s)).   Radiological Exams on Admission: Dg Chest 2 View  Result Date: 07/24/2017 CLINICAL DATA:  Center chest tightness and a "hot" sensation in the bilateral neck and jaw x2 weeks. Pt denies SOB. Hx of heart palpitations. Nonsmoker. EXAM: CHEST - 2 VIEW  COMPARISON:  None. FINDINGS: The heart size and mediastinal contours are within normal limits. Both lungs are clear. The visualized skeletal structures are unremarkable. IMPRESSION: No active cardiopulmonary disease. Electronically Signed   By: Nolon Nations M.D.   On: 07/24/2017 17:38   Ct Angio Chest Pe W And/or Wo Contrast  Result Date: 07/24/2017 CLINICAL DATA:  Chest pain EXAM: CT ANGIOGRAPHY CHEST WITH CONTRAST TECHNIQUE: Multidetector CT imaging of the chest was performed using the standard protocol during bolus administration of intravenous contrast. Multiplanar CT image reconstructions and MIPs were obtained to evaluate the vascular anatomy. CONTRAST:  65 mL ISOVUE-370 IOPAMIDOL (ISOVUE-370) INJECTION 76% COMPARISON:  Chest x-ray 07/24/2017 FINDINGS: Cardiovascular: Satisfactory opacification of the pulmonary arteries to the segmental level. No evidence of pulmonary embolism. Normal heart size. No pericardial effusion. Nonaneurysmal aorta. No dissection is seen. Mediastinum/Nodes: No enlarged mediastinal, hilar, or axillary lymph nodes. Thyroid gland, trachea, and esophagus demonstrate no significant findings. Lungs/Pleura: Lungs are clear. No pleural effusion or pneumothorax. Upper Abdomen: No acute abnormality. Musculoskeletal: No chest wall abnormality. No acute or significant osseous findings. Review of the MIP images confirms the above findings. IMPRESSION: Negative for acute pulmonary embolus or aortic dissection. Clear lung fields. Electronically Signed   By: Donavan Foil M.D.   On: 07/24/2017 20:28    EKG: Independently reviewed.  Sinus tachycardia.  Assessment/Plan Principal Problem:   Chest pain Active Problems:   HLD (hyperlipidemia)    1. Chest pain -we will cycle cardiac markers check 2D echo.  On aspirin.  Patient is chest pain-free at this time. 2. History of hyperlipidemia on statins. 3. Elevated blood pressure reading.  We will closely monitor blood pressure  trends.   DVT prophylaxis: Lovenox. Code Status: Full code. Family Communication: Patient's daughter at the bedside. Disposition Plan: Home. Consults called: Cardiology. Admission status: Observation.   Rise Patience MD [facility] Pager [phone].  If 7PM-7AM, please contact night-coverage [email or web address] Password [facility]  07/24/2017, 10:48 PM

## 2017-07-24 NOTE — ED Notes (Signed)
Pt ambulate to bathroom, back in room with family @ bedside VS documented Call lightt within reach

## 2017-07-24 NOTE — ED Notes (Signed)
Patient transported to CT 

## 2017-07-24 NOTE — Progress Notes (Signed)
Patient arrived to unit, ambulated to bed. No complaints of chest pain, but does complain of 7/10 headache. Patient updated on plan of care, call bell in reach. Patient's family at bedside.

## 2017-07-24 NOTE — ED Provider Notes (Deleted)
MSE was initiated and I personally evaluated the patient and placed orders (if any) at  4:44 PM on July 24, 2017.  The patient appears stable so that the remainder of the MSE may be completed by another provider.  Patient placed in Quick Look pathway, seen and evaluated   Chief Complaint: sorethroat  HPI:   Onset of sorethraot today. + fever, chills, difficulty swallowing + febrile today  ROS: sore throat (one)  Physical Exam:   Gen: No distress  Neuro: Awake and Alert  Skin: Warm    Focused Exam: BL tonsillar exudate   Initiation of care has begun. The patient has been counseled on the process, plan, and necessity for staying for the completion/evaluation, and the remainder of the medical screening examination    Margarita Mail, PA-C 07/24/17 1647

## 2017-07-24 NOTE — ED Provider Notes (Signed)
[facility] EMERGENCY DEPARTMENT Provider Note   CSN: [id number] Arrival date & time: 07/24/17  1639     History   Chief Complaint Chief Complaint  Patient presents with  . Chest Pain    HPI Nancy Sparks is a 52 y.o. female.  HPI Pt has pain in the lower chest upper abdomen, that radiates up towards her ears and jaws.  Started severely at 3pm. It lasts a couple of hours at a time.   It is not necessarily related to eating.  Yesterday she was playing tennis and she started having discomfort in the chest and she felt it was tight.  No nausea or sweaty.  The other day she was on a walk and she felt fatigued.  She started having pain again today and it would not go away.  She checked her blood pressure and it was elevated.  She previously saw the PCP and was started on antacids.    Pt has history of high cholesterol.  Father had bypass in his 71s   Past Medical History:  Diagnosis Date  . Anxiety   . Complication of anesthesia    itching relieved with benadryl   . Depression   . Dyslipidemia   . Fibroid   . Heart palpitations    hx of  . Hx of adenomatous polyp of colon 12/24/2002    Patient Active Problem List   Diagnosis Date Noted  . Libido, decreased 01/01/2016  . Hot flashes 01/01/2016  . Insomnia 01/01/2016  . HTN (hypertension) 01/03/2015  . Family history of colon cancer - sister 04/24/2014  . OAB (overactive bladder) 11/04/2013  . Dyslipidemia   . Indigestion   . Heart palpitations   . Hx of adenomatous polyp of colon 12/24/2002    Past Surgical History:  Procedure Laterality Date  . CESAREAN SECTION     X3  . COLONOSCOPY    . LUMBAR LAMINECTOMY/DECOMPRESSION MICRODISCECTOMY  04/25/2011   Procedure: LUMBAR LAMINECTOMY/DECOMPRESSION MICRODISCECTOMY 1 LEVEL;  Surgeon: Elaina Hoops, MD;  Location: [place] [facility];  Service: Neurosurgery;  Laterality: Left;  Left Lumbar Three-Four Laminectomy and Diskectomy  . LUMBAR  LAMINECTOMY/DECOMPRESSION MICRODISCECTOMY  04/26/2011   Procedure: LUMBAR LAMINECTOMY/DECOMPRESSION MICRODISCECTOMY 1 LEVEL;  Surgeon: Elaina Hoops, MD;  Location: [place] [facility];  Service: Neurosurgery;  Laterality: N/A;  Re-Exploration of Lumbar Wound and Evacuation of Epidural Hematoma  . [place]  2006  . TUBAL LIGATION       OB History    Gravida  4   Para  3   Term  3   Preterm      AB  1   Living  3     SAB      TAB      Ectopic      Multiple      Live Births               Home Medications    Prior to Admission medications   Medication Sig Start Date End Date Taking? Authorizing Provider  ALPRAZolam (XANAX) 0.25 MG tablet Take 1 tablet (0.25 mg total) by mouth at bedtime as needed for anxiety. 06/15/14   Terrance Mass, MD  estradiol (VIVELLE-DOT) 0.05 MG/24HR patch Place 1 patch (0.05 mg total) onto the skin 2 (two) times a week. 03/20/17   Huel Cote, NP  ibuprofen (ADVIL,MOTRIN) 200 MG tablet Take 200 mg by mouth every 6 (six) hours as needed.    [provider]  progesterone (PROMETRIUM) 200 MG capsule Take 1 capsule (200 mg total) by mouth daily. Take one tablet the first 12 days of the month 03/18/17   Huel Cote, NP  rosuvastatin (CRESTOR) 10 MG tablet Take 1 tablet (10 mg total) by mouth daily. 03/28/16   Nahser, Wonda Cheng, MD  trazodone (DESYREL) 300 MG tablet Take 1 tablet (300 mg total) by mouth at bedtime. Take 1/2 tablet bedtime as needed 03/18/17   Huel Cote, NP  calcium carbonate (OS-CAL) 600 MG TABS Take 600 mg by mouth 2 (two) times daily with meals.    04/24/11  [provider]    Family History Family History  Problem Relation Age of Onset  . Hyperlipidemia Mother   . Hypertension Mother   . Anesthesia problems Mother        nausea  . Coronary artery disease Father        CABGx3 age 66  . Hyperlipidemia Father   . Atrial fibrillation Father   . Hypertension Father   . Heart disease Father   .  Diabetes Father   . Hyperlipidemia Sister        X49  . Cancer Sister        colon cancer  . Thyroid disease Sister   . Anesthesia problems Sister        nausea  . Colon cancer Sister 52  . Hyperlipidemia Brother        x2    Social History Social History   Tobacco Use  . Smoking status: Never Smoker  . Smokeless tobacco: Never Used  . Tobacco comment: quit smoking in college  Substance Use Topics  . Alcohol use: Yes    Alcohol/week: 4.2 oz    Types: 7 Standard drinks or equivalent per week    Comment: drinks wine or mixed drinks socially /1-4 nights per week per pt  . Drug use: No     Allergies   Latex and Tape   Review of Systems Review of Systems  All other systems reviewed and are negative.    Physical Exam Updated Vital Signs BP (!) 125/96   Pulse 78   Temp 98.5 F (36.9 C) (Oral)   Resp 12   SpO2 98%   Physical Exam  Constitutional: She appears well-developed and well-nourished. No distress.  HENT:  Head: Normocephalic and atraumatic.  Right Ear: External ear normal.  Left Ear: External ear normal.  Eyes: Conjunctivae are normal. Right eye exhibits no discharge. Left eye exhibits no discharge. No scleral icterus.  Neck: Neck supple. No tracheal deviation present.  Cardiovascular: Normal rate, regular rhythm and intact distal pulses.  Pulmonary/Chest: Effort normal and breath sounds normal. No stridor. No respiratory distress. She has no wheezes. She has no rales.  Abdominal: Soft. Bowel sounds are normal. She exhibits no distension. There is no tenderness. There is no rebound and no guarding.  Musculoskeletal: She exhibits no edema or tenderness.  Neurological: She is alert. She has normal strength. No cranial nerve deficit (no facial droop, extraocular movements intact, no slurred speech) or sensory deficit. She exhibits normal muscle tone. She displays no seizure activity. Coordination normal.  Skin: Skin is warm and dry. No rash noted.    Psychiatric: She has a normal mood and affect.  Nursing note and vitals reviewed.    ED Treatments / Results  Labs (all labs ordered are listed, but only abnormal results are displayed) Labs Reviewed  BASIC METABOLIC PANEL - Abnormal; Notable for  the following components:      Result Value   Glucose, Bld 130 (*)    All other components within normal limits  D-DIMER, QUANTITATIVE (NOT AT [facility]) - Abnormal; Notable for the following components:   D-Dimer, Quant 2.93 (*)    All other components within normal limits  CBC  I-STAT TROPONIN, ED  I-STAT BETA HCG BLOOD, ED ([facility], [facility], [facility] ONLY)  I-STAT TROPONIN, ED    EKG EKG Interpretation  Date/Time:  Thursday July 24 2017 16:41:42 EDT Ventricular Rate:  102 PR Interval:  200 QRS Duration: 96 QT Interval:  352 QTC Calculation: 458 R Axis:   89 Text Interpretation:  Sinus tachycardia Otherwise normal ECG No significant change since last tracing Confirmed by Dorie Rank [phone]) on 07/24/2017 6:21:55 PM   Radiology Dg Chest 2 View  Result Date: 07/24/2017 CLINICAL DATA:  Center chest tightness and a "hot" sensation in the bilateral neck and jaw x2 weeks. Pt denies SOB. Hx of heart palpitations. Nonsmoker. EXAM: CHEST - 2 VIEW COMPARISON:  None. FINDINGS: The heart size and mediastinal contours are within normal limits. Both lungs are clear. The visualized skeletal structures are unremarkable. IMPRESSION: No active cardiopulmonary disease. Electronically Signed   By: Nolon Nations M.D.   On: 07/24/2017 17:38   Ct Angio Chest Pe W And/or Wo Contrast  Result Date: 07/24/2017 CLINICAL DATA:  Chest pain EXAM: CT ANGIOGRAPHY CHEST WITH CONTRAST TECHNIQUE: Multidetector CT imaging of the chest was performed using the standard protocol during bolus administration of intravenous contrast. Multiplanar CT image reconstructions and MIPs were obtained to evaluate the vascular anatomy. CONTRAST:  65 mL ISOVUE-370 IOPAMIDOL (ISOVUE-370) INJECTION 76%  COMPARISON:  Chest x-ray 07/24/2017 FINDINGS: Cardiovascular: Satisfactory opacification of the pulmonary arteries to the segmental level. No evidence of pulmonary embolism. Normal heart size. No pericardial effusion. Nonaneurysmal aorta. No dissection is seen. Mediastinum/Nodes: No enlarged mediastinal, hilar, or axillary lymph nodes. Thyroid gland, trachea, and esophagus demonstrate no significant findings. Lungs/Pleura: Lungs are clear. No pleural effusion or pneumothorax. Upper Abdomen: No acute abnormality. Musculoskeletal: No chest wall abnormality. No acute or significant osseous findings. Review of the MIP images confirms the above findings. IMPRESSION: Negative for acute pulmonary embolus or aortic dissection. Clear lung fields. Electronically Signed   By: Donavan Foil M.D.   On: 07/24/2017 20:28    Procedures Procedures (including critical care time)  Medications Ordered in ED Medications  iopamidol (ISOVUE-370) 76 % injection (has no administration in time range)  aspirin chewable tablet 324 mg (324 mg Oral Given 07/24/17 1834)  nitroGLYCERIN (NITROGLYN) 2 % ointment 1 inch (1 inch Topical Given 07/24/17 1834)  acetaminophen (TYLENOL) tablet 650 mg (650 mg Oral Given 07/24/17 1958)  iopamidol (ISOVUE-370) 76 % injection 100 mL (100 mLs Intravenous Contrast Given 07/24/17 2012)     Initial Impression / Assessment and Plan / ED Course  I have reviewed the triage vital signs and the nursing notes.  Pertinent labs & imaging results that were available during my care of the patient were reviewed by me and considered in my medical decision making (see chart for details).  Clinical Course as of Jul 24 2052  Thu Jul 24, 2017  2051 Laboratory tests notable for elevated d-dimer.   [JK]    Clinical Course User Index [JK] Dorie Rank, MD   Pt presented to the ED for evaluation of chest pain.  No known history of heart disease.  Initial ED work-up is reassuring.  Patient is moderate risk heart  score.  Patient has some features that are concerning for possible ACS with the exercise-induced component.  Patient is due to travel out of the country next week.  She is understandably concerned about this worsening chest pain.  Think it is reasonable to bring her in for cardiac rule out and possible stress test.  Final Clinical Impressions(s) / ED Diagnoses   Final diagnoses:  Chest pain, unspecified type      Dorie Rank, MD 07/24/17 2054

## 2017-07-25 ENCOUNTER — Observation Stay (HOSPITAL_COMMUNITY): Payer: BLUE CROSS/BLUE SHIELD

## 2017-07-25 ENCOUNTER — Observation Stay (HOSPITAL_BASED_OUTPATIENT_CLINIC_OR_DEPARTMENT_OTHER): Payer: BLUE CROSS/BLUE SHIELD

## 2017-07-25 ENCOUNTER — Encounter (HOSPITAL_COMMUNITY): Payer: Self-pay | Admitting: Radiology

## 2017-07-25 DIAGNOSIS — R079 Chest pain, unspecified: Secondary | ICD-10-CM | POA: Diagnosis not present

## 2017-07-25 DIAGNOSIS — R072 Precordial pain: Secondary | ICD-10-CM

## 2017-07-25 DIAGNOSIS — E7800 Pure hypercholesterolemia, unspecified: Secondary | ICD-10-CM

## 2017-07-25 LAB — TROPONIN I: Troponin I: <0.03 ng/mL (ref <0.03)

## 2017-07-25 LAB — CBC
HEMATOCRIT: 35.6 % — AB (ref 36.0–46.0)
Hemoglobin: 11.9 g/dL — ABNORMAL LOW (ref 12.0–15.0)
MCH: 30.7 pg (ref 26.0–34.0)
MCHC: 33.4 g/dL (ref 30.0–36.0)
MCV: 92 fL (ref 78.0–100.0)
Platelets: 164 10*3/uL (ref 150–400)
RBC: 3.87 MIL/uL (ref 3.87–5.11)
RDW: 12.2 % (ref 11.5–15.5)
WBC: 5.1 10*3/uL (ref 4.0–10.5)

## 2017-07-25 LAB — HEPATIC FUNCTION PANEL
ALT: 23 U/L (ref 14–54)
AST: 23 U/L (ref 15–41)
Albumin: 3.8 g/dL (ref 3.5–5.0)
Alkaline Phosphatase: 57 U/L (ref 38–126)
Bilirubin, Direct: <0.1 mg/dL — ABNORMAL LOW (ref 0.1–0.5)
TOTAL PROTEIN: 6.5 g/dL (ref 6.5–8.1)
Total Bilirubin: 0.5 mg/dL (ref 0.3–1.2)

## 2017-07-25 LAB — ECHOCARDIOGRAM COMPLETE
HEIGHTINCHES: 66 in
Weight: 2774.4 oz

## 2017-07-25 LAB — BASIC METABOLIC PANEL
Anion gap: 10 (ref 5–15)
BUN: 15 mg/dL (ref 6–20)
CHLORIDE: 108 mmol/L (ref 101–111)
CO2: 21 mmol/L — AB (ref 22–32)
Calcium: 9 mg/dL (ref 8.9–10.3)
Creatinine, Ser: 0.89 mg/dL (ref 0.44–1.00)
GFR calc Af Amer: >60 mL/min (ref >60)
GLUCOSE: 87 mg/dL (ref 65–99)
POTASSIUM: 3.9 mmol/L (ref 3.5–5.1)
Sodium: 139 mmol/L (ref 135–145)

## 2017-07-25 LAB — LIPID PANEL
CHOLESTEROL: 263 mg/dL — AB (ref 0–200)
HDL: 78 mg/dL (ref >40)
LDL Cholesterol: 173 mg/dL — ABNORMAL HIGH (ref 0–99)
Total CHOL/HDL Ratio: 3.4 RATIO
Triglycerides: 58 mg/dL (ref <150)
VLDL: 12 mg/dL (ref 0–40)

## 2017-07-25 LAB — CREATININE, SERUM
Creatinine, Ser: 0.94 mg/dL (ref 0.44–1.00)
GFR calc Af Amer: >60 mL/min (ref >60)
GFR calc non Af Amer: >60 mL/min (ref >60)

## 2017-07-25 LAB — HIV ANTIBODY (ROUTINE TESTING W REFLEX): HIV SCREEN 4TH GENERATION: NONREACTIVE

## 2017-07-25 MED ORDER — IOPAMIDOL (ISOVUE-370) INJECTION 76%
100.0000 mL | Freq: Once | INTRAVENOUS | Status: AC | PRN
  Administered 2017-07-25: 100 mL via INTRAVENOUS

## 2017-07-25 MED ORDER — NITROGLYCERIN 0.4 MG SL SUBL
SUBLINGUAL_TABLET | SUBLINGUAL | Status: AC
  Filled 2017-07-25: qty 2

## 2017-07-25 MED ORDER — NITROGLYCERIN 0.4 MG SL SUBL
0.8000 mg | SUBLINGUAL_TABLET | Freq: Once | SUBLINGUAL | Status: AC
  Administered 2017-07-25: 0.8 mg via SUBLINGUAL

## 2017-07-25 MED ORDER — METOPROLOL TARTRATE 50 MG PO TABS
50.0000 mg | ORAL_TABLET | Freq: Once | ORAL | Status: AC
  Administered 2017-07-25: 50 mg via ORAL
  Filled 2017-07-25: qty 1

## 2017-07-25 MED ORDER — TICAGRELOR 90 MG PO TABS
90.0000 mg | ORAL_TABLET | Freq: Two times a day (BID) | ORAL | Status: DC
  Administered 2017-07-25 – 2017-07-29 (×7): 90 mg via ORAL
  Filled 2017-07-25 (×8): qty 1

## 2017-07-25 MED ORDER — METOPROLOL TARTRATE 5 MG/5ML IV SOLN
5.0000 mg | INTRAVENOUS | Status: DC | PRN
  Administered 2017-07-25: 5 mg via INTRAVENOUS

## 2017-07-25 MED ORDER — METOPROLOL TARTRATE 5 MG/5ML IV SOLN
INTRAVENOUS | Status: AC
  Filled 2017-07-25: qty 5

## 2017-07-25 MED ORDER — ALPRAZOLAM 0.25 MG PO TABS
0.2500 mg | ORAL_TABLET | Freq: Once | ORAL | Status: AC
  Administered 2017-07-25: 0.25 mg via ORAL
  Filled 2017-07-25: qty 1

## 2017-07-25 MED ORDER — METOPROLOL TARTRATE 25 MG PO TABS
25.0000 mg | ORAL_TABLET | Freq: Two times a day (BID) | ORAL | Status: DC
  Administered 2017-07-25 – 2017-07-29 (×7): 25 mg via ORAL
  Filled 2017-07-25 (×8): qty 1

## 2017-07-25 MED ORDER — IOPAMIDOL (ISOVUE-370) INJECTION 76%
INTRAVENOUS | Status: AC
  Filled 2017-07-25: qty 100

## 2017-07-25 NOTE — Progress Notes (Signed)
Patient Demographics:    Nancy Sparks, is a 52 y.o. female, DOB - 1965/10/31, EXB:[id number]  Admit date - 07/24/2017   Admitting Physician Rise Patience, MD  Outpatient Primary MD for the patient is Tisovec, Fransico Him, MD  LOS - 0   Chief Complaint  Patient presents with  . Chest Pain        Subjective:    [name] today has no fevers, no emesis, intermittent chest discomfort, husband at bedside, questions answered  Assessment  & Plan :    Principal Problem:   Chest pain Active Problems:   HLD (hyperlipidemia)  Brief summary  52 year old female on HRT with history of dyslipidemia admitted on 07/24/2017 with atypical chest pain  Plan:- 1) atypical chest pain-chest negative for PE, ruled out for ACS by catheter enzymes and EKG, CT coronary angiogram shows high-grade RCA stenosis with high risk features, discussed with cardiology team recommended left heart cath and possible PCI which cannot be done until 07/28/2017 due to contrast load.  The patient received contrast for CTA chest on 07/24/2017 and then patient received contrast also for CT coronary angiogram on 07/25/2017, Brilinta as per  cardiology team given possible single-vessel disease/RCA stenosis, metoprolol as per cardiology team  2)Dyslipidemia-HDL is 76 most likely due to HRT, LDL above 170, continue Crestor  3)HRT--- patient will try to slowly wean off HRT over the next few months  Code Status : FULL   Disposition Plan  : After left heart cath  Consults  : cardiology service   DVT Prophylaxis  :  Lovenox   Lab Results  Component Value Date   PLT 164 07/25/2017    Inpatient Medications  Scheduled Meds: . aspirin EC  81 mg Oral Daily  . enoxaparin (LOVENOX) injection  40 mg Subcutaneous Daily  . iopamidol      . metoprolol tartrate      . metoprolol tartrate  25 mg Oral BID  . nitroGLYCERIN      . rosuvastatin   20 mg Oral QHS  . ticagrelor  90 mg Oral BID  . trazodone  100 mg Oral QHS   Continuous Infusions: PRN Meds:.acetaminophen **OR** acetaminophen, metoprolol tartrate, ondansetron **OR** ondansetron (ZOFRAN) IV    Anti-infectives (From admission, onward)   None        Objective:   Vitals:   07/24/17 2154 07/25/17 0404 07/25/17 1153 07/25/17 1532  BP: 131/83 117/82 (!) 163/95 134/82  Pulse: 83 75 82   Resp: 18 18 20    Temp: 98 F (36.7 C) 98.2 F (36.8 C) 98.3 F (36.8 C)   TempSrc: Oral Oral Oral   SpO2: 96% 96% 99%   Weight: 78.7 kg (173 lb 6.4 oz) 78.7 kg (173 lb 6.4 oz)    Height: 5\' 6"  (1.676 m)       Wt Readings from Last 3 Encounters:  07/25/17 78.7 kg (173 lb 6.4 oz)  03/18/17 78.9 kg (174 lb)  01/01/16 76.2 kg (168 lb)     Intake/Output Summary (Last 24 hours) at 07/25/2017 1803 Last data filed at 07/25/2017 1402 Gross per 24 hour  Intake 1300 ml  Output 400 ml  Net 900 ml     Physical Exam  Gen:- Awake Alert,  In  no apparent distress  HEENT:- [place].AT, No sclera icterus Neck-Supple Neck,No JVD,.  Lungs-  CTAB , good air movement CV- S1, S2 normal Abd-  +ve B.Sounds, Abd Soft, No tenderness,    Extremity/Skin:- No  edema, good pulses Psych-affect is appropriate, oriented x3 Neuro-no new focal deficits, no tremors   Data Review:   Micro Results No results found for this or any previous visit (from the past 240 hour(s)).  Radiology Reports Dg Chest 2 View  Result Date: 07/24/2017 CLINICAL DATA:  Center chest tightness and a "hot" sensation in the bilateral neck and jaw x2 weeks. Pt denies SOB. Hx of heart palpitations. Nonsmoker. EXAM: CHEST - 2 VIEW COMPARISON:  None. FINDINGS: The heart size and mediastinal contours are within normal limits. Both lungs are clear. The visualized skeletal structures are unremarkable. IMPRESSION: No active cardiopulmonary disease. Electronically Signed   By: Nolon Nations M.D.   On: 07/24/2017 17:38   Ct Angio  Chest Pe W And/or Wo Contrast  Result Date: 07/24/2017 CLINICAL DATA:  Chest pain EXAM: CT ANGIOGRAPHY CHEST WITH CONTRAST TECHNIQUE: Multidetector CT imaging of the chest was performed using the standard protocol during bolus administration of intravenous contrast. Multiplanar CT image reconstructions and MIPs were obtained to evaluate the vascular anatomy. CONTRAST:  65 mL ISOVUE-370 IOPAMIDOL (ISOVUE-370) INJECTION 76% COMPARISON:  Chest x-ray 07/24/2017 FINDINGS: Cardiovascular: Satisfactory opacification of the pulmonary arteries to the segmental level. No evidence of pulmonary embolism. Normal heart size. No pericardial effusion. Nonaneurysmal aorta. No dissection is seen. Mediastinum/Nodes: No enlarged mediastinal, hilar, or axillary lymph nodes. Thyroid gland, trachea, and esophagus demonstrate no significant findings. Lungs/Pleura: Lungs are clear. No pleural effusion or pneumothorax. Upper Abdomen: No acute abnormality. Musculoskeletal: No chest wall abnormality. No acute or significant osseous findings. Review of the MIP images confirms the above findings. IMPRESSION: Negative for acute pulmonary embolus or aortic dissection. Clear lung fields. Electronically Signed   By: Donavan Foil M.D.   On: 07/24/2017 20:28   Ct Coronary Morph W/cta Cor W/score W/ca W/cm &/or Wo/cm  Addendum Date: 07/25/2017   ADDENDUM REPORT: 07/25/2017 17:06 CLINICAL DATA:  Chest pain EXAM: Cardiac CTA MEDICATIONS: Sub lingual nitro. 4mg  x 2 and lopressor 5mg  IV TECHNIQUE: The patient was scanned on a Siemens 759 slice scanner. Gantry rotation speed was 250 msecs. Collimation was 0.6 mm. A 100 kV prospective scan was triggered in the ascending thoracic aorta at 35-75% of the R-R interval. Average HR during the scan was 60 bpm. The 3D data set was interpreted on a dedicated work station using MPR, MIP and VRT modes. A total of 80cc of contrast was used. FINDINGS: Non-cardiac: See separate report from [facility].  Calcium Score: 74 Agatston units. Coronary Arteries: Right dominant with no anomalies LM: No plaque or stenosis. LAD system: Mixed plaque, mild stenosis proximal LAD. There is artifact obscuring the take-off of moderate D1, unable to comment on disease in this portion of D1, otherwise the vessel is patent with no plaque. Circumflex system: No plaque or stenosis. RCA system: Calcified plaque proximal RCA, mild stenosis. Soft plaque mid RCA, mod-severe stenosis. IMPRESSION: 1. Coronary artery calcium score 74 Agatston units. This places the patient in the 96th percentile for age and gender, suggesting high risk for future cardiac events. 2.  Suspect moderate-severe mid RCA stenosis. [name] Electronically Signed   By: Loralie Champagne M.D.   On: 07/25/2017 17:06   Result Date: 07/25/2017 EXAM: OVER-READ INTERPRETATION  CT CHEST The following report is an over-read  performed by radiologist Dr. Vinnie Langton of [facility], [place] on 07/25/2017. This over-read does not include interpretation of cardiac or coronary anatomy or pathology. The coronary calcium score/coronary CTA interpretation by the cardiologist is attached. COMPARISON:  Chest CT 07/24/2017. FINDINGS: Aortic atherosclerosis. Within the visualized portions of the thorax there are no suspicious appearing pulmonary nodules or masses, there is no acute consolidative airspace disease, no pleural effusions, no pneumothorax and no lymphadenopathy. Visualized portions of the upper abdomen are unremarkable. There are no aggressive appearing lytic or blastic lesions noted in the visualized portions of the skeleton. IMPRESSION: 1.  Aortic Atherosclerosis (ICD10-I70.0). Electronically Signed: By: Vinnie Langton M.D. On: 07/25/2017 15:57     CBC Recent Labs  Lab 07/24/17 1701 07/24/17 2258 07/25/17 0444  WBC 5.5 5.4 5.1  HGB 12.8 12.2 11.9*  HCT 38.4 36.9 35.6*  PLT 174 182 164  MCV 93.4 93.2 92.0  MCH 31.1 30.8 30.7  MCHC 33.3 33.1 33.4    RDW 12.1 12.3 12.2    Chemistries  Recent Labs  Lab 07/24/17 1701 07/24/17 2258 07/25/17 0444  NA 139  --  139  K 3.8  --  3.9  CL 105  --  108  CO2 27  --  21*  GLUCOSE 130*  --  87  BUN 15  --  15  CREATININE 0.88 0.94 0.89  CALCIUM 10.0  --  9.0  AST  --  23  --   ALT  --  23  --   ALKPHOS  --  57  --   BILITOT  --  0.5  --    ------------------------------------------------------------------------------------------------------------------ Recent Labs    07/25/17 1051  CHOL 263*  HDL 78  LDLCALC 173*  TRIG 58  CHOLHDL 3.4    No results found for: HGBA1C ------------------------------------------------------------------------------------------------------------------ No results for input(s): TSH, T4TOTAL, T3FREE, THYROIDAB in the last 72 hours.  Invalid input(s): FREET3 ------------------------------------------------------------------------------------------------------------------ No results for input(s): VITAMINB12, FOLATE, FERRITIN, TIBC, IRON, RETICCTPCT in the last 72 hours.  Coagulation profile No results for input(s): INR, PROTIME in the last 168 hours.  Recent Labs    07/24/17 1832  DDIMER 2.93*    Cardiac Enzymes Recent Labs  Lab 07/24/17 2258 07/25/17 0444 07/25/17 1051  TROPONINI <0.03 <0.03 <0.03   ------------------------------------------------------------------------------------------------------------------ No results found for: BNP   Roxan Hockey M.D on 07/25/2017 at 6:03 PM  Between 7am to 7pm - Pager - [phone]  After 7pm go to [email or web address] - password [email or web address]  [facility] -  Office  [phone]   Voice Recognition Viviann Spare dictation system was used to create this note, attempts have been made to correct errors. Please contact the author with questions and/or clarifications.

## 2017-07-25 NOTE — Consult Note (Addendum)
Cardiology Consultation:   Patient ID: Nancy Sparks; [id number]; [birth year]   Admit date: 07/24/2017 Date of Consult: 07/25/2017  Primary Care Provider: Haywood Pao, MD Primary Cardiologist: Mertie Moores, MD  Primary Electrophysiologist:     Patient Profile:   Nancy Sparks is a 52 y.o. female with a hx of HLD, chronic back pain, and paliptations who is being seen today for the evaluation of chest pain at the request of Dr. Denton Brick.  History of Present Illness:   Nancy Sparks was last seen by [facility] 07/10/15 by Dr. Acie Fredrickson. At that time, it was noted that she had a lot of life stressors and was suffering from poor diet and weight gain. She has previously been noncompliant with medications, including HTN and HLD medications. She was supposed to follow up 6 months later, but does not appear that appt occurred. Of note, she had a normal stress echo in 2008.   She presented to [facility] 07/24/17 with complaints of chest pain. She reports intermittent chest pain for the past 2 weeks. She was apparently seen by her PCP and started on nexium two weeks ago. Chest pain has not resolved and she became SOB at tennis on 07/24/17. Elevated pressure prompted evaluation in the ER.   On my interview, she reports intermittent chest pain for the past two weeks. This chest pain occurs with both rest and with activity. It has occurred daily for the past two weeks. Chest pain radiates to her jaws bilaterally and to both arms with numbness and tingling in the fingers. This has typically not been associated with shortness of breath, nausea, or diaphoresis. However, she was forced to leave tennis clinic early yesterday morning for shortness of breath with exertion. This has never happened to her before. After returning home from tennis, the chest pain occurred again, but was not more severe than usual. However, she became more anxious about it and checked her BP. BP was elevated to systolic 660. She called her  daughter who recently started nursing school who checked her pressure at 630 systolic. She denies headache, changes in vision, or speech. The family is leaving for [place] next Wed and she would like to have a cardiac evaluation prior to her trip. She does not smoke, is active normally, and takes estrogen replacement therapy. Her father had CABG in his 54s. She has has a history of noncompliance, noted in Dr. Elmarie Shiley progress note in 2015. She has been compliant on her crestor for the past 2 weeks since chest pain started. No home BP medications.  On arrival D-dimer was positive, CTA was negative for PE. Troponin was negative and EKG non-acute. She denies recurrence of chest pain since being hospitalized.    Past Medical History:  Diagnosis Date  . Anemia    "w/pregnancies  . Anxiety    hx  . Complication of anesthesia    itching relieved with benadryl w/all my surgeries (07/24/2017)  . Depression    hx  . Dyslipidemia   . Fibroid   . GERD (gastroesophageal reflux disease)   . Heart murmur    hx  . Heart palpitations    hx of  . Hx of adenomatous polyp of colon 12/24/2002  . Hypertension     Past Surgical History:  Procedure Laterality Date  . BACK SURGERY    . [place]; 1998; 2001  . COLONOSCOPY    . LUMBAR LAMINECTOMY/DECOMPRESSION MICRODISCECTOMY  04/25/2011   Procedure: LUMBAR LAMINECTOMY/DECOMPRESSION MICRODISCECTOMY 1 LEVEL;  Surgeon: Elaina Hoops, MD;  Location: [place] NEURO ORS;  Service: Neurosurgery;  Laterality: Left;  Left Lumbar Three-Four Laminectomy and Diskectomy  . LUMBAR LAMINECTOMY/DECOMPRESSION MICRODISCECTOMY  04/26/2011   Procedure: LUMBAR LAMINECTOMY/DECOMPRESSION MICRODISCECTOMY 1 LEVEL;  Surgeon: Elaina Hoops, MD;  Location: [place] NEURO ORS;  Service: Neurosurgery;  Laterality: N/A;  Re-Exploration of Lumbar Wound and Evacuation of Epidural Hematoma  . [place]  2006  . TUBAL LIGATION  2001     Home Medications:  Prior to Admission  medications   Medication Sig Start Date End Date Taking? Authorizing Provider  estradiol (VIVELLE-DOT) 0.05 MG/24HR patch Place 1 patch (0.05 mg total) onto the skin 2 (two) times a week. 03/20/17  Yes Huel Cote, NP  ibuprofen (ADVIL,MOTRIN) 200 MG tablet Take 600-800 mg by mouth every 6 (six) hours as needed (pain).    Yes [provider]  rosuvastatin (CRESTOR) 20 MG tablet Take 20 mg by mouth at bedtime. 07/15/17  Yes [provider]  Tetrahydrozoline HCl (VISINE OP) Place 1 drop into both eyes daily as needed (dry eyes).   Yes [provider]  trazodone (DESYREL) 300 MG tablet Take 1 tablet (300 mg total) by mouth at bedtime. Take 1/2 tablet bedtime as needed Patient taking differently: Take 100 mg by mouth at bedtime. 1/3 tablet 03/18/17  Yes Young, Candiss Norse, NP  ALPRAZolam Duanne Moron) 0.25 MG tablet Take 1 tablet (0.25 mg total) by mouth at bedtime as needed for anxiety. Patient not taking: Reported on 07/24/2017 06/15/14   Terrance Mass, MD  progesterone (PROMETRIUM) 200 MG capsule Take 1 capsule (200 mg total) by mouth daily. Take one tablet the first 12 days of the month Patient taking differently: Take 200 mg by mouth See admin instructions. Take one capsule (200 mg) by mouth daily at bedtime on first 12 days of the month, hold for remainder of month and then repeat on the 1st of the next month. 03/18/17   Huel Cote, NP  rosuvastatin (CRESTOR) 10 MG tablet Take 1 tablet (10 mg total) by mouth daily. Patient not taking: Reported on 07/24/2017 03/28/16   Nahser, Wonda Cheng, MD  calcium carbonate (OS-CAL) 600 MG TABS Take 600 mg by mouth 2 (two) times daily with meals.    04/24/11  [provider]    Inpatient Medications: Scheduled Meds: . aspirin EC  81 mg Oral Daily  . enoxaparin (LOVENOX) injection  40 mg Subcutaneous Daily  . rosuvastatin  20 mg Oral QHS  . trazodone  100 mg Oral QHS   Continuous Infusions:  PRN Meds: acetaminophen **OR**  acetaminophen, ondansetron **OR** ondansetron (ZOFRAN) IV  Allergies:    Allergies  Allergen Reactions  . Latex Rash  . Tape Rash    Paper tape ok    Social History:   Social History   Socioeconomic History  . Marital status: Married    Spouse name: Not on file  . Number of children: 3  . Years of education: Not on file  . Highest education level: Not on file  Occupational History  . Occupation: stay at home mom  Social Needs  . Financial resource strain: Not on file  . Food insecurity:    Worry: Not on file    Inability: Not on file  . Transportation needs:    Medical: Not on file    Non-medical: Not on file  Tobacco Use  . Smoking status: Former Smoker    Packs/day: 0.10    Years: 2.00  Pack years: 0.20    Types: Cigarettes  . Smokeless tobacco: Never Used  . Tobacco comment: quit smoking in college  Substance and Sexual Activity  . Alcohol use: Yes    Alcohol/week: 3.6 oz    Types: 6 Glasses of wine per week  . Drug use: Not Currently    Types: Marijuana    Comment: 07/24/2017 "in college"  . Sexual activity: Not on file  Lifestyle  . Physical activity:    Days per week: Not on file    Minutes per session: Not on file  . Stress: Not on file  Relationships  . Social connections:    Talks on phone: Not on file    Gets together: Not on file    Attends religious service: Not on file    Active member of club or organization: Not on file    Attends meetings of clubs or organizations: Not on file    Relationship status: Not on file  . Intimate partner violence:    Fear of current or ex partner: Not on file    Emotionally abused: Not on file    Physically abused: Not on file    Forced sexual activity: Not on file  Other Topics Concern  . Not on file  Social History Narrative  . Not on file    Family History:    Family History  Problem Relation Age of Onset  . Hyperlipidemia Mother   . Hypertension Mother   . Anesthesia problems Mother         nausea  . Coronary artery disease Father        CABGx3 age 82  . Hyperlipidemia Father   . Atrial fibrillation Father   . Hypertension Father   . Heart disease Father   . Diabetes Father   . Hyperlipidemia Sister        X85  . Cancer Sister        colon cancer  . Thyroid disease Sister   . Anesthesia problems Sister        nausea  . Colon cancer Sister 75  . Hyperlipidemia Brother        x2     ROS:  Please see the history of present illness.  All other ROS reviewed and negative.     Physical Exam/Data:   Vitals:   07/24/17 2115 07/24/17 2134 07/24/17 2154 07/25/17 0404  BP: 126/84 (!) 139/91 131/83 117/82  Pulse: 92 81 83 75  Resp:  15 18 18   Temp:   98 F (36.7 C) 98.2 F (36.8 C)  TempSrc:   Oral Oral  SpO2: 97% 100% 96% 96%  Weight:   173 lb 6.4 oz (78.7 kg) 173 lb 6.4 oz (78.7 kg)  Height:   5\' 6"  (1.676 m)     Intake/Output Summary (Last 24 hours) at 07/25/2017 0932 Last data filed at 07/25/2017 0407 Gross per 24 hour  Intake 720 ml  Output 400 ml  Net 320 ml   Filed Weights   07/24/17 2154 07/25/17 0404  Weight: 173 lb 6.4 oz (78.7 kg) 173 lb 6.4 oz (78.7 kg)   Body mass index is 27.99 kg/m.  General:  Well nourished, well developed, in no acute distress HEENT: normal Neck: no JVD Vascular: No carotid bruits Cardiac:  normal S1, S2; RRR; no murmur Lungs:  clear to auscultation bilaterally, no wheezing, rhonchi or rales  Abd: soft, nontender, no hepatomegaly  Ext: no edema Musculoskeletal:  No deformities, BUE and BLE  strength normal and equal Skin: warm and dry  Neuro:  CNs 2-12 intact, no focal abnormalities noted Psych:  Normal affect   EKG:  The EKG was personally reviewed and demonstrates:  sinus Telemetry:  Telemetry was personally reviewed and demonstrates:  sinus  Relevant CV Studies:  Echo pending  Laboratory Data:  Chemistry Recent Labs  Lab 07/24/17 1701 07/24/17 2258 07/25/17 0444  NA 139  --  139  K 3.8  --  3.9  CL  105  --  108  CO2 27  --  21*  GLUCOSE 130*  --  87  BUN 15  --  15  CREATININE 0.88 0.94 0.89  CALCIUM 10.0  --  9.0  GFRNONAA >60 >60 >60  GFRAA >60 >60 >60  ANIONGAP 7  --  10    Recent Labs  Lab 07/24/17 2258  PROT 6.5  ALBUMIN 3.8  AST 23  ALT 23  ALKPHOS 57  BILITOT 0.5   Hematology Recent Labs  Lab 07/24/17 1701 07/24/17 2258 07/25/17 0444  WBC 5.5 5.4 5.1  RBC 4.11 3.96 3.87  HGB 12.8 12.2 11.9*  HCT 38.4 36.9 35.6*  MCV 93.4 93.2 92.0  MCH 31.1 30.8 30.7  MCHC 33.3 33.1 33.4  RDW 12.1 12.3 12.2  PLT 174 182 164   Cardiac Enzymes Recent Labs  Lab 07/24/17 2258 07/25/17 0444  TROPONINI <0.03 <0.03    Recent Labs  Lab 07/24/17 1703 07/24/17 2011  TROPIPOC 0.00 0.01    BNPNo results for input(s): BNP, PROBNP in the last 168 hours.  DDimer  Recent Labs  Lab 07/24/17 1832  DDIMER 2.93*    Radiology/Studies:  Dg Chest 2 View  Result Date: 07/24/2017 CLINICAL DATA:  Center chest tightness and a "hot" sensation in the bilateral neck and jaw x2 weeks. Pt denies SOB. Hx of heart palpitations. Nonsmoker. EXAM: CHEST - 2 VIEW COMPARISON:  None. FINDINGS: The heart size and mediastinal contours are within normal limits. Both lungs are clear. The visualized skeletal structures are unremarkable. IMPRESSION: No active cardiopulmonary disease. Electronically Signed   By: Nolon Nations M.D.   On: 07/24/2017 17:38   Ct Angio Chest Pe W And/or Wo Contrast  Result Date: 07/24/2017 CLINICAL DATA:  Chest pain EXAM: CT ANGIOGRAPHY CHEST WITH CONTRAST TECHNIQUE: Multidetector CT imaging of the chest was performed using the standard protocol during bolus administration of intravenous contrast. Multiplanar CT image reconstructions and MIPs were obtained to evaluate the vascular anatomy. CONTRAST:  65 mL ISOVUE-370 IOPAMIDOL (ISOVUE-370) INJECTION 76% COMPARISON:  Chest x-ray 07/24/2017 FINDINGS: Cardiovascular: Satisfactory opacification of the pulmonary arteries to  the segmental level. No evidence of pulmonary embolism. Normal heart size. No pericardial effusion. Nonaneurysmal aorta. No dissection is seen. Mediastinum/Nodes: No enlarged mediastinal, hilar, or axillary lymph nodes. Thyroid gland, trachea, and esophagus demonstrate no significant findings. Lungs/Pleura: Lungs are clear. No pleural effusion or pneumothorax. Upper Abdomen: No acute abnormality. Musculoskeletal: No chest wall abnormality. No acute or significant osseous findings. Review of the MIP images confirms the above findings. IMPRESSION: Negative for acute pulmonary embolus or aortic dissection. Clear lung fields. Electronically Signed   By: Donavan Foil M.D.   On: 07/24/2017 20:28    Assessment and Plan:   1. Chest pain - troponin x 3 negative - EKG without signs of acute ischemia - pt describes chest pain with typical and atypical features concerning for angina - pt has essentially ruled out for ACS given negative troponins, but does have risk factors for  CAD, including HLD and family history (although not premature heart disease) - will review CTA and echo with attending - pt is anxious and wants to complete a cardiac evaluation prior to her [place] trip - would recommend CT coronary for evaluation, if able to complete today - sCr normal following CTA yesterday   2. Hypertension - on arrival, pressure was 168/109 - she is not on home BP medications according to home med list - pressures have since resolved and are normal    3. Elevated D-dimer 2.93 - CTA negative for PE - would recommend bilateral lower extremity duplex   4. HLD - last lipid profile with LDL 217, but per notes, she was not taking her crestor routinely - repeat lipids with LDL  - agree with crestor 20 mg, titrate up as needed - she has been compliant on crestor for the past two weeks, when chest pain started   For questions or updates, please contact [place] Please consult [email or web address] for  contact info under Cardiology/STEMI.   Signed, Ledora Bottcher, PA  07/25/2017 9:32 AM    I have seen and examined the patient along with Ledora Bottcher, PA .  I have reviewed the chart, notes and new data.  I agree with PA/NP's note.  Key new complaints: No ongoing complaints of chest pain at this time. Key examination changes: Normal cardiovascular exam.  Blood pressure is normal when she is relaxed, but easily increases when upset or in discomfort. Key new findings / data: Severely elevated LDL cholesterol insistent with heterozygous familial hypercholesterolemia.  CT angiogram performed last night reviewed for evidence of atherosclerosis.  She does have a small apparently isolated calcified plaque in the proximal right coronary artery, but the left coronary artery appears free of calcified disease.  Echo reviewed.  Normal left ventricular systolic and diastolic function, normal wall motion.  ECG and cardiac enzymes low risk.  PLAN: She is very concerned about the possibility of underlying coronary insufficiency.  We will schedule for coronary CT angiogram today. Clifton James of the results of this test, I would strongly encourage her to continue statin therapy since she has high risk for development of vascular disease. To wean off hormone replacement therapy gradually. Reevaluate her blood pressure as an outpatient.  I am quite confident that if she again becomes more physically active her blood pressure will be even lower than it is now.  I do not think she requires pharmacological therapy.  Sanda Klein, MD, [place] [phone] 07/25/2017, 12:49 PM

## 2017-07-25 NOTE — Plan of Care (Signed)
  Problem: Education: Goal: Knowledge of General Education information will improve Outcome: Progressing   Problem: Health Behavior/Discharge Planning: Goal: Ability to manage health-related needs will improve Outcome: Progressing   Problem: Activity: Goal: Risk for activity intolerance will decrease Outcome: Progressing   Problem: Coping: Goal: Level of anxiety will decrease Outcome: Progressing

## 2017-07-25 NOTE — Progress Notes (Signed)
  Echocardiogram 2D Echocardiogram has been performed.  Nancy Sparks 07/25/2017, 10:35 AM

## 2017-07-25 NOTE — Progress Notes (Signed)
Patient is asking to take a shower, NP paged.

## 2017-07-25 NOTE — Progress Notes (Signed)
CT coronary angiogram shows a high grade RCA stenosis with high risk features. She still has occasional mild chest tightness. Recommend coronary angiography, but she has received two doses of contrast for PE protocol chest CT and coronary CT in last 24 hours. Will delay diagnostic coronary angio and probable PCI until Monday. Scheduled for 3PM with Dr. Claiborne Billings. This procedure has been fully reviewed with the patient and consent has been obtained. Since the CTA convincingly shows single vessel disease, will start Brilinta pre-PCI. As ECG and troponin negative, will hold off heparin for now.  Nancy Klein, MD, [facility] [phone] office [phone] pager

## 2017-07-25 NOTE — Progress Notes (Signed)
Patient tearful upon entering room D/T new cardiac diagnosis. Patient is asking for something to help with anxiety. NP paged.

## 2017-07-26 DIAGNOSIS — I25119 Atherosclerotic heart disease of native coronary artery with unspecified angina pectoris: Secondary | ICD-10-CM

## 2017-07-26 DIAGNOSIS — I25110 Atherosclerotic heart disease of native coronary artery with unstable angina pectoris: Principal | ICD-10-CM

## 2017-07-26 DIAGNOSIS — R072 Precordial pain: Secondary | ICD-10-CM | POA: Diagnosis not present

## 2017-07-26 NOTE — Progress Notes (Signed)
Patient Demographics:    Nancy Sparks, is a 52 y.o. female, DOB - 1965/03/03, DJM:[id number]  Admit date - 07/24/2017   Admitting Physician Rise Patience, MD  Outpatient Primary MD for the patient is Tisovec, Fransico Him, MD  LOS - 0   Chief Complaint  Patient presents with  . Chest Pain        Subjective:    Nancy Sparks today has no fevers, no emesis, son at bedside, mild intermittent chest discomfort  Assessment  & Plan :    Principal Problem:   Chest pain Active Problems:   HLD (hyperlipidemia)   Coronary artery disease involving native coronary artery of native heart with unstable angina pectoris [facility])  Brief summary  52 year old female on HRT with history of dyslipidemia admitted on 07/24/2017 with atypical chest pain  Plan:- 1) atypical chest pain-continues to have mild intermittent chest discomfort, chest negative for PE, ruled out for ACS by cardiac enzymes and EKG, CT coronary angiogram shows high-grade RCA stenosis with high risk features, discussed with cardiology team, they  recommended left heart cath and possible PCI which cannot be done until 07/28/2017 due to contrast load.  The patient received contrast for CTA chest on 07/24/2017 and then patient received contrast also for CT coronary angiogram on 07/25/2017, Brilinta as per  cardiology team given possible single-vessel disease/RCA stenosis, metoprolol as per cardiology team  2)Dyslipidemia- HDL is 78 most likely due to HRT, LDL above 170, continue Crestor,   3)HRT--- patient will try to slowly wean off HRT over the next few months  Code Status : FULL   Disposition Plan  : After left heart cath  Consults  : cardiology service   DVT Prophylaxis  :  Lovenox   Lab Results  Component Value Date   PLT 164 07/25/2017    Inpatient Medications  Scheduled Meds: . aspirin EC  81 mg Oral Daily  . enoxaparin (LOVENOX)  injection  40 mg Subcutaneous Daily  . metoprolol tartrate  25 mg Oral BID  . rosuvastatin  20 mg Oral QHS  . ticagrelor  90 mg Oral BID  . trazodone  100 mg Oral QHS   Continuous Infusions: PRN Meds:.acetaminophen **OR** acetaminophen, metoprolol tartrate, ondansetron **OR** ondansetron (ZOFRAN) IV    Anti-infectives (From admission, onward)   None        Objective:   Vitals:   07/26/17 0010 07/26/17 0347 07/26/17 0349 07/26/17 1134  BP: 111/76 123/71  (!) 141/90  Pulse: 61 61  61  Resp:  20  20  Temp: 98.2 F (36.8 C) 98.3 F (36.8 C)  98.2 F (36.8 C)  TempSrc: Oral Oral  Oral  SpO2: 97% 98%  100%  Weight:   77.8 kg (171 lb 8 oz)   Height:        Wt Readings from Last 3 Encounters:  07/26/17 77.8 kg (171 lb 8 oz)  03/18/17 78.9 kg (174 lb)  01/01/16 76.2 kg (168 lb)     Intake/Output Summary (Last 24 hours) at 07/26/2017 1705 Last data filed at 07/26/2017 1341 Gross per 24 hour  Intake 720 ml  Output 1400 ml  Net -680 ml     Physical Exam  Gen:- Awake Alert,  In no apparent distress  HEENT:- [place].AT, No sclera icterus Neck-Supple Neck,No JVD,.  Lungs-  CTAB , good air movement CV- S1, S2 normal Abd-  +ve B.Sounds, Abd Soft, No tenderness,    Extremity/Skin:- No  edema, good pulses Psych-affect is appropriate, oriented x3 Neuro-no new focal deficits, no tremors   Data Review:   Micro Results No results found for this or any previous visit (from the past 240 hour(s)).  Radiology Reports Dg Chest 2 View  Result Date: 07/24/2017 CLINICAL DATA:  Center chest tightness and a "hot" sensation in the bilateral neck and jaw x2 weeks. Pt denies SOB. Hx of heart palpitations. Nonsmoker. EXAM: CHEST - 2 VIEW COMPARISON:  None. FINDINGS: The heart size and mediastinal contours are within normal limits. Both lungs are clear. The visualized skeletal structures are unremarkable. IMPRESSION: No active cardiopulmonary disease. Electronically Signed   By: Nolon Nations M.D.   On: 07/24/2017 17:38   Ct Angio Chest Pe W And/or Wo Contrast  Result Date: 07/24/2017 CLINICAL DATA:  Chest pain EXAM: CT ANGIOGRAPHY CHEST WITH CONTRAST TECHNIQUE: Multidetector CT imaging of the chest was performed using the standard protocol during bolus administration of intravenous contrast. Multiplanar CT image reconstructions and MIPs were obtained to evaluate the vascular anatomy. CONTRAST:  65 mL ISOVUE-370 IOPAMIDOL (ISOVUE-370) INJECTION 76% COMPARISON:  Chest x-ray 07/24/2017 FINDINGS: Cardiovascular: Satisfactory opacification of the pulmonary arteries to the segmental level. No evidence of pulmonary embolism. Normal heart size. No pericardial effusion. Nonaneurysmal aorta. No dissection is seen. Mediastinum/Nodes: No enlarged mediastinal, hilar, or axillary lymph nodes. Thyroid gland, trachea, and esophagus demonstrate no significant findings. Lungs/Pleura: Lungs are clear. No pleural effusion or pneumothorax. Upper Abdomen: No acute abnormality. Musculoskeletal: No chest wall abnormality. No acute or significant osseous findings. Review of the MIP images confirms the above findings. IMPRESSION: Negative for acute pulmonary embolus or aortic dissection. Clear lung fields. Electronically Signed   By: Donavan Foil M.D.   On: 07/24/2017 20:28   Ct Coronary Morph W/cta Cor W/score W/ca W/cm &/or Wo/cm  Addendum Date: 07/25/2017   ADDENDUM REPORT: 07/25/2017 17:06 CLINICAL DATA:  Chest pain EXAM: Cardiac CTA MEDICATIONS: Sub lingual nitro. 4mg  x 2 and lopressor 5mg  IV TECHNIQUE: The patient was scanned on a Siemens 092 slice scanner. Gantry rotation speed was 250 msecs. Collimation was 0.6 mm. A 100 kV prospective scan was triggered in the ascending thoracic aorta at 35-75% of the R-R interval. Average HR during the scan was 60 bpm. The 3D data set was interpreted on a dedicated work station using MPR, MIP and VRT modes. A total of 80cc of contrast was used. FINDINGS: Non-cardiac: See  separate report from [facility]. Calcium Score: 74 Agatston units. Coronary Arteries: Right dominant with no anomalies LM: No plaque or stenosis. LAD system: Mixed plaque, mild stenosis proximal LAD. There is artifact obscuring the take-off of moderate D1, unable to comment on disease in this portion of D1, otherwise the vessel is patent with no plaque. Circumflex system: No plaque or stenosis. RCA system: Calcified plaque proximal RCA, mild stenosis. Soft plaque mid RCA, mod-severe stenosis. IMPRESSION: 1. Coronary artery calcium score 74 Agatston units. This places the patient in the 96th percentile for age and gender, suggesting high risk for future cardiac events. 2.  Suspect moderate-severe mid RCA stenosis. [name] Electronically Signed   By: Loralie Champagne M.D.   On: 07/25/2017 17:06   Result Date: 07/25/2017 EXAM: OVER-READ INTERPRETATION  CT CHEST The following report is an over-read performed by radiologist Dr.  Vinnie Langton of [facility], [place] on 07/25/2017. This over-read does not include interpretation of cardiac or coronary anatomy or pathology. The coronary calcium score/coronary CTA interpretation by the cardiologist is attached. COMPARISON:  Chest CT 07/24/2017. FINDINGS: Aortic atherosclerosis. Within the visualized portions of the thorax there are no suspicious appearing pulmonary nodules or masses, there is no acute consolidative airspace disease, no pleural effusions, no pneumothorax and no lymphadenopathy. Visualized portions of the upper abdomen are unremarkable. There are no aggressive appearing lytic or blastic lesions noted in the visualized portions of the skeleton. IMPRESSION: 1.  Aortic Atherosclerosis (ICD10-I70.0). Electronically Signed: By: Vinnie Langton M.D. On: 07/25/2017 15:57     CBC Recent Labs  Lab 07/24/17 1701 07/24/17 2258 07/25/17 0444  WBC 5.5 5.4 5.1  HGB 12.8 12.2 11.9*  HCT 38.4 36.9 35.6*  PLT 174 182 164  MCV 93.4 93.2 92.0    MCH 31.1 30.8 30.7  MCHC 33.3 33.1 33.4  RDW 12.1 12.3 12.2    Chemistries  Recent Labs  Lab 07/24/17 1701 07/24/17 2258 07/25/17 0444  NA 139  --  139  K 3.8  --  3.9  CL 105  --  108  CO2 27  --  21*  GLUCOSE 130*  --  87  BUN 15  --  15  CREATININE 0.88 0.94 0.89  CALCIUM 10.0  --  9.0  AST  --  23  --   ALT  --  23  --   ALKPHOS  --  57  --   BILITOT  --  0.5  --    ------------------------------------------------------------------------------------------------------------------ Recent Labs    07/25/17 1051  CHOL 263*  HDL 78  LDLCALC 173*  TRIG 58  CHOLHDL 3.4    No results found for: HGBA1C ------------------------------------------------------------------------------------------------------------------ No results for input(s): TSH, T4TOTAL, T3FREE, THYROIDAB in the last 72 hours.  Invalid input(s): FREET3 ------------------------------------------------------------------------------------------------------------------ No results for input(s): VITAMINB12, FOLATE, FERRITIN, TIBC, IRON, RETICCTPCT in the last 72 hours.  Coagulation profile No results for input(s): INR, PROTIME in the last 168 hours.  Recent Labs    07/24/17 1832  DDIMER 2.93*    Cardiac Enzymes Recent Labs  Lab 07/24/17 2258 07/25/17 0444 07/25/17 1051  TROPONINI <0.03 <0.03 <0.03   ------------------------------------------------------------------------------------------------------------------ No results found for: BNP   Roxan Hockey M.D on 07/26/2017 at 5:05 PM  Between 7am to 7pm - Pager - [phone]  After 7pm go to [email or web address] - password [email or web address]  [facility] -  Office  [phone]   Voice Recognition Viviann Spare dictation system was used to create this note, attempts have been made to correct errors. Please contact the author with questions and/or clarifications.

## 2017-07-26 NOTE — Progress Notes (Signed)
Progress Note  Patient Name: Nancy Sparks Date of Encounter: 07/26/2017  Primary Cardiologist: Mertie Moores, MD   Subjective   Feeling better, chest pain less frequent and less intense.  Inpatient Medications    Scheduled Meds: . aspirin EC  81 mg Oral Daily  . enoxaparin (LOVENOX) injection  40 mg Subcutaneous Daily  . metoprolol tartrate  25 mg Oral BID  . rosuvastatin  20 mg Oral QHS  . ticagrelor  90 mg Oral BID  . trazodone  100 mg Oral QHS   Continuous Infusions:  PRN Meds: acetaminophen **OR** acetaminophen, metoprolol tartrate, ondansetron **OR** ondansetron (ZOFRAN) IV   Vital Signs    Vitals:   07/26/17 0010 07/26/17 0347 07/26/17 0349 07/26/17 1134  BP: 111/76 123/71  (!) 141/90  Pulse: 61 61  61  Resp:  20  20  Temp: 98.2 F (36.8 C) 98.3 F (36.8 C)  98.2 F (36.8 C)  TempSrc: Oral Oral  Oral  SpO2: 97% 98%  100%  Weight:   171 lb 8 oz (77.8 kg)   Height:        Intake/Output Summary (Last 24 hours) at 07/26/2017 1136 Last data filed at 07/26/2017 0900 Gross per 24 hour  Intake 960 ml  Output 400 ml  Net 560 ml   Filed Weights   07/24/17 2154 07/25/17 0404 07/26/17 0349  Weight: 173 lb 6.4 oz (78.7 kg) 173 lb 6.4 oz (78.7 kg) 171 lb 8 oz (77.8 kg)    Telemetry    NSR - Personally Reviewed  ECG    No new tracing - Personally Reviewed  Physical Exam  Comfortable GEN: No acute distress.   Neck: No JVD Cardiac: RRR, no murmurs, rubs, or gallops.  Respiratory: Clear to auscultation bilaterally. GI: Soft, nontender, non-distended  MS: No edema; No deformity. Neuro:  Nonfocal  Psych: Normal affect   Labs    Chemistry Recent Labs  Lab 07/24/17 1701 07/24/17 2258 07/25/17 0444  NA 139  --  139  K 3.8  --  3.9  CL 105  --  108  CO2 27  --  21*  GLUCOSE 130*  --  87  BUN 15  --  15  CREATININE 0.88 0.94 0.89  CALCIUM 10.0  --  9.0  PROT  --  6.5  --   ALBUMIN  --  3.8  --   AST  --  23  --   ALT  --  23  --   ALKPHOS   --  57  --   BILITOT  --  0.5  --   GFRNONAA >60 >60 >60  GFRAA >60 >60 >60  ANIONGAP 7  --  10     Hematology Recent Labs  Lab 07/24/17 1701 07/24/17 2258 07/25/17 0444  WBC 5.5 5.4 5.1  RBC 4.11 3.96 3.87  HGB 12.8 12.2 11.9*  HCT 38.4 36.9 35.6*  MCV 93.4 93.2 92.0  MCH 31.1 30.8 30.7  MCHC 33.3 33.1 33.4  RDW 12.1 12.3 12.2  PLT 174 182 164    Cardiac Enzymes Recent Labs  Lab 07/24/17 2258 07/25/17 0444 07/25/17 1051  TROPONINI <0.03 <0.03 <0.03    Recent Labs  Lab 07/24/17 1703 07/24/17 2011  TROPIPOC 0.00 0.01     BNPNo results for input(s): BNP, PROBNP in the last 168 hours.   DDimer  Recent Labs  Lab 07/24/17 1832  DDIMER 2.93*     Radiology    Dg Chest 2 View  Result Date: 07/24/2017 CLINICAL DATA:  Center chest tightness and a "hot" sensation in the bilateral neck and jaw x2 weeks. Pt denies SOB. Hx of heart palpitations. Nonsmoker. EXAM: CHEST - 2 VIEW COMPARISON:  None. FINDINGS: The heart size and mediastinal contours are within normal limits. Both lungs are clear. The visualized skeletal structures are unremarkable. IMPRESSION: No active cardiopulmonary disease. Electronically Signed   By: Nolon Nations M.D.   On: 07/24/2017 17:38   Ct Angio Chest Pe W And/or Wo Contrast  Result Date: 07/24/2017 CLINICAL DATA:  Chest pain EXAM: CT ANGIOGRAPHY CHEST WITH CONTRAST TECHNIQUE: Multidetector CT imaging of the chest was performed using the standard protocol during bolus administration of intravenous contrast. Multiplanar CT image reconstructions and MIPs were obtained to evaluate the vascular anatomy. CONTRAST:  65 mL ISOVUE-370 IOPAMIDOL (ISOVUE-370) INJECTION 76% COMPARISON:  Chest x-ray 07/24/2017 FINDINGS: Cardiovascular: Satisfactory opacification of the pulmonary arteries to the segmental level. No evidence of pulmonary embolism. Normal heart size. No pericardial effusion. Nonaneurysmal aorta. No dissection is seen. Mediastinum/Nodes: No  enlarged mediastinal, hilar, or axillary lymph nodes. Thyroid gland, trachea, and esophagus demonstrate no significant findings. Lungs/Pleura: Lungs are clear. No pleural effusion or pneumothorax. Upper Abdomen: No acute abnormality. Musculoskeletal: No chest wall abnormality. No acute or significant osseous findings. Review of the MIP images confirms the above findings. IMPRESSION: Negative for acute pulmonary embolus or aortic dissection. Clear lung fields. Electronically Signed   By: Donavan Foil M.D.   On: 07/24/2017 20:28   Ct Coronary Morph W/cta Cor W/score W/ca W/cm &/or Wo/cm  Addendum Date: 07/25/2017   ADDENDUM REPORT: 07/25/2017 17:06 CLINICAL DATA:  Chest pain EXAM: Cardiac CTA MEDICATIONS: Sub lingual nitro. 4mg  x 2 and lopressor 5mg  IV TECHNIQUE: The patient was scanned on a Siemens 782 slice scanner. Gantry rotation speed was 250 msecs. Collimation was 0.6 mm. A 100 kV prospective scan was triggered in the ascending thoracic aorta at 35-75% of the R-R interval. Average HR during the scan was 60 bpm. The 3D data set was interpreted on a dedicated work station using MPR, MIP and VRT modes. A total of 80cc of contrast was used. FINDINGS: Non-cardiac: See separate report from [facility]. Calcium Score: 74 Agatston units. Coronary Arteries: Right dominant with no anomalies LM: No plaque or stenosis. LAD system: Mixed plaque, mild stenosis proximal LAD. There is artifact obscuring the take-off of moderate D1, unable to comment on disease in this portion of D1, otherwise the vessel is patent with no plaque. Circumflex system: No plaque or stenosis. RCA system: Calcified plaque proximal RCA, mild stenosis. Soft plaque mid RCA, mod-severe stenosis. IMPRESSION: 1. Coronary artery calcium score 74 Agatston units. This places the patient in the 96th percentile for age and gender, suggesting high risk for future cardiac events. 2.  Suspect moderate-severe mid RCA stenosis. [name]  Electronically Signed   By: Loralie Champagne M.D.   On: 07/25/2017 17:06   Result Date: 07/25/2017 EXAM: OVER-READ INTERPRETATION  CT CHEST The following report is an over-read performed by radiologist Dr. Vinnie Langton of [facility], [place] on 07/25/2017. This over-read does not include interpretation of cardiac or coronary anatomy or pathology. The coronary calcium score/coronary CTA interpretation by the cardiologist is attached. COMPARISON:  Chest CT 07/24/2017. FINDINGS: Aortic atherosclerosis. Within the visualized portions of the thorax there are no suspicious appearing pulmonary nodules or masses, there is no acute consolidative airspace disease, no pleural effusions, no pneumothorax and no lymphadenopathy. Visualized portions of the upper abdomen are  unremarkable. There are no aggressive appearing lytic or blastic lesions noted in the visualized portions of the skeleton. IMPRESSION: 1.  Aortic Atherosclerosis (ICD10-I70.0). Electronically Signed: By: Vinnie Langton M.D. On: 07/25/2017 15:57    Cardiac Studies   ECHO 07/25/2017  - Left ventricle: The cavity size was normal. Systolic function was   normal. The estimated ejection fraction was in the range of 55%   to 60%. Wall motion was normal; there were no regional wall   motion abnormalities. Left ventricular diastolic function   parameters were normal. - Aortic valve: Valve area (VTI): 2.23 cm^2. Valve area (Vmax):   2.03 cm^2. Valve area (Vmean): 2.18 cm^2.   Patient Profile     52 y.o. female with unstable angina and CT suggestive of high grade stenosis in RCA, waiting for angiography.  Assessment & Plan    Discussed cath/PCI procedure in detail. This procedure has been fully reviewed with the patient and written informed consent has been obtained. Aggressive lipid lowering (she just restarted her rosuvastatin 2 weeks ago). Suspect we will need to add PCSK9 inh to achieve LDL goal<70.  For questions or updates, please  contact [place] Please consult [email or web address] for contact info under Cardiology/STEMI.      Signed, Sanda Klein, MD  07/26/2017, 11:36 AM

## 2017-07-27 DIAGNOSIS — I25110 Atherosclerotic heart disease of native coronary artery with unstable angina pectoris: Secondary | ICD-10-CM | POA: Diagnosis not present

## 2017-07-27 DIAGNOSIS — R072 Precordial pain: Secondary | ICD-10-CM | POA: Diagnosis not present

## 2017-07-27 DIAGNOSIS — E7801 Familial hypercholesterolemia: Secondary | ICD-10-CM | POA: Diagnosis not present

## 2017-07-27 MED ORDER — ALPRAZOLAM 0.25 MG PO TABS
0.2500 mg | ORAL_TABLET | Freq: Three times a day (TID) | ORAL | Status: DC | PRN
  Administered 2017-07-27 – 2017-07-28 (×4): 0.25 mg via ORAL
  Filled 2017-07-27 (×4): qty 1

## 2017-07-27 NOTE — H&P (View-Only) (Signed)
Progress Note  Patient Name: Nancy Sparks Date of Encounter: 07/27/2017  Primary Cardiologist: Mertie Moores, MD   Subjective   Chest pain has resolved.  Inpatient Medications    Scheduled Meds: . aspirin EC  81 mg Oral Daily  . enoxaparin (LOVENOX) injection  40 mg Subcutaneous Daily  . metoprolol tartrate  25 mg Oral BID  . rosuvastatin  20 mg Oral QHS  . ticagrelor  90 mg Oral BID  . trazodone  100 mg Oral QHS   Continuous Infusions:  PRN Meds: acetaminophen **OR** acetaminophen, ALPRAZolam, metoprolol tartrate, ondansetron **OR** ondansetron (ZOFRAN) IV   Vital Signs    Vitals:   07/26/17 0349 07/26/17 1134 07/26/17 2037 07/27/17 0641  BP:  (!) 141/90 (!) 143/83 127/82  Pulse:  61 74 70  Resp:  20 18 18   Temp:  98.2 F (36.8 C) 98.1 F (36.7 C) 98.3 F (36.8 C)  TempSrc:  Oral Oral Oral  SpO2:  100% 98% 99%  Weight: 171 lb 8 oz (77.8 kg)   172 lb 1.6 oz (78.1 kg)  Height:        Intake/Output Summary (Last 24 hours) at 07/27/2017 1211 Last data filed at 07/27/2017 1037 Gross per 24 hour  Intake 960 ml  Output 2300 ml  Net -1340 ml   Filed Weights   07/25/17 0404 07/26/17 0349 07/27/17 0641  Weight: 173 lb 6.4 oz (78.7 kg) 171 lb 8 oz (77.8 kg) 172 lb 1.6 oz (78.1 kg)    Telemetry    Sinus rhythm- Personally Reviewed  ECG    No new tracing- Personally Reviewed  Physical Exam  Appears comfortable GEN: No acute distress.   Neck: No JVD Cardiac: RRR, no murmurs, rubs, or gallops.  Respiratory: Clear to auscultation bilaterally. GI: Soft, nontender, non-distended  MS: No edema; No deformity. Neuro:  Nonfocal  Psych: Normal affect   Labs    Chemistry Recent Labs  Lab 07/24/17 1701 07/24/17 2258 07/25/17 0444  NA 139  --  139  K 3.8  --  3.9  CL 105  --  108  CO2 27  --  21*  GLUCOSE 130*  --  87  BUN 15  --  15  CREATININE 0.88 0.94 0.89  CALCIUM 10.0  --  9.0  PROT  --  6.5  --   ALBUMIN  --  3.8  --   AST  --  23  --     ALT  --  23  --   ALKPHOS  --  57  --   BILITOT  --  0.5  --   GFRNONAA >60 >60 >60  GFRAA >60 >60 >60  ANIONGAP 7  --  10     Hematology Recent Labs  Lab 07/24/17 1701 07/24/17 2258 07/25/17 0444  WBC 5.5 5.4 5.1  RBC 4.11 3.96 3.87  HGB 12.8 12.2 11.9*  HCT 38.4 36.9 35.6*  MCV 93.4 93.2 92.0  MCH 31.1 30.8 30.7  MCHC 33.3 33.1 33.4  RDW 12.1 12.3 12.2  PLT 174 182 164    Cardiac Enzymes Recent Labs  Lab 07/24/17 2258 07/25/17 0444 07/25/17 1051  TROPONINI <0.03 <0.03 <0.03    Recent Labs  Lab 07/24/17 1703 07/24/17 2011  TROPIPOC 0.00 0.01     BNPNo results for input(s): BNP, PROBNP in the last 168 hours.   DDimer  Recent Labs  Lab 07/24/17 1832  DDIMER 2.93*     Radiology    Ct Coronary  Morph W/cta Cor W/score W/ca W/cm &/or Wo/cm  Addendum Date: 07/25/2017   ADDENDUM REPORT: 07/25/2017 17:06 CLINICAL DATA:  Chest pain EXAM: Cardiac CTA MEDICATIONS: Sub lingual nitro. 4mg  x 2 and lopressor 5mg  IV TECHNIQUE: The patient was scanned on a Siemens 694 slice scanner. Gantry rotation speed was 250 msecs. Collimation was 0.6 mm. A 100 kV prospective scan was triggered in the ascending thoracic aorta at 35-75% of the R-R interval. Average HR during the scan was 60 bpm. The 3D data set was interpreted on a dedicated work station using MPR, MIP and VRT modes. A total of 80cc of contrast was used. FINDINGS: Non-cardiac: See separate report from [facility]. Calcium Score: 74 Agatston units. Coronary Arteries: Right dominant with no anomalies LM: No plaque or stenosis. LAD system: Mixed plaque, mild stenosis proximal LAD. There is artifact obscuring the take-off of moderate D1, unable to comment on disease in this portion of D1, otherwise the vessel is patent with no plaque. Circumflex system: No plaque or stenosis. RCA system: Calcified plaque proximal RCA, mild stenosis. Soft plaque mid RCA, mod-severe stenosis. IMPRESSION: 1. Coronary artery calcium score 74  Agatston units. This places the patient in the 96th percentile for age and gender, suggesting high risk for future cardiac events. 2.  Suspect moderate-severe mid RCA stenosis. [name] Electronically Signed   By: Loralie Champagne M.D.   On: 07/25/2017 17:06   Result Date: 07/25/2017 EXAM: OVER-READ INTERPRETATION  CT CHEST The following report is an over-read performed by radiologist Dr. Vinnie Langton of [facility], [place] on 07/25/2017. This over-read does not include interpretation of cardiac or coronary anatomy or pathology. The coronary calcium score/coronary CTA interpretation by the cardiologist is attached. COMPARISON:  Chest CT 07/24/2017. FINDINGS: Aortic atherosclerosis. Within the visualized portions of the thorax there are no suspicious appearing pulmonary nodules or masses, there is no acute consolidative airspace disease, no pleural effusions, no pneumothorax and no lymphadenopathy. Visualized portions of the upper abdomen are unremarkable. There are no aggressive appearing lytic or blastic lesions noted in the visualized portions of the skeleton. IMPRESSION: 1.  Aortic Atherosclerosis (ICD10-I70.0). Electronically Signed: By: Vinnie Langton M.D. On: 07/25/2017 15:57    Cardiac Studies  ECHO 07/25/2017  - Left ventricle: The cavity size was normal. Systolic function was normal. The estimated ejection fraction was in the range of 55% to 60%. Wall motion was normal; there were no regional wall motion abnormalities. Left ventricular diastolic function parameters were normal.  Patient Profile     52 y.o. female with unstable angina and CT suggestive of high grade stenosis in RCA, waiting for angiography.  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  Assessment & Plan    For diagnostic and probably interventional cardiac catheterization tomorrow, expect angioplasty and stent to  the right coronary artery. This procedure has been fully reviewed with the patient and written informed consent has been obtained. Antiplatelet therapy has already been initiated, aspirin and Brilinta. Continue statin but will have to add PCSK9 inhibitor to achieve target LDL.  For questions or updates, please contact [place] Please consult [email or web address] for contact info under Cardiology/STEMI.      Signed, Sanda Klein, MD  07/27/2017, 12:11 PM

## 2017-07-27 NOTE — Progress Notes (Signed)
Patient Demographics:    Nancy Sparks, is a 52 y.o. female, DOB - [birth year], LSL:[id number]  Admit date - 07/24/2017   Admitting Physician Rise Patience, MD  Outpatient Primary MD for the patient is Tisovec, Fransico Him, MD  LOS - 0   Chief Complaint  Patient presents with  . Chest Pain        Subjective:    Nancy Sparks today has no fevers, no emesis, no further chest pains, husband at bedside,  Assessment  & Plan :    Principal Problem:   Chest pain Active Problems:   HLD (hyperlipidemia)   Coronary artery disease involving native coronary artery of native heart with unstable angina pectoris [facility])  Brief summary  52 year old female on HRT with history of dyslipidemia admitted on 07/24/2017 with atypical chest pain  Plan:- 1)Atypical chest pain-continues to have mild intermittent chest discomfort, chest CTA negative for PE, ruled out for ACS by cardiac enzymes and EKG, CT coronary angiogram shows high-grade RCA stenosis with high risk features, discussed with cardiology team, they  recommended left heart cath and possible PCI which cannot be done until 07/28/2017 due to contrast load.  The patient received contrast for CTA chest on 07/24/2017 and then patient received contrast also for CT coronary angiogram on 07/25/2017, Brilinta as per  cardiology team given possible single-vessel disease/RCA stenosis, metoprolol as per cardiology team  2)Dyslipidemia- HDL is 78 most likely due to HRT, LDL above 170, continue Crestor, may need PCSK9 inhibitor to achieve target LDL.  3)HRT--- patient will try to slowly wean off HRT over the next few months  Code Status : FULL   Disposition Plan  : After left heart cath  Consults  : cardiology service   DVT Prophylaxis  :  Lovenox   Lab Results  Component Value Date   PLT 164 07/25/2017    Inpatient Medications  Scheduled Meds: . aspirin EC  81  mg Oral Daily  . enoxaparin (LOVENOX) injection  40 mg Subcutaneous Daily  . metoprolol tartrate  25 mg Oral BID  . rosuvastatin  20 mg Oral QHS  . ticagrelor  90 mg Oral BID  . trazodone  100 mg Oral QHS   Continuous Infusions: PRN Meds:.acetaminophen **OR** acetaminophen, ALPRAZolam, metoprolol tartrate, ondansetron **OR** ondansetron (ZOFRAN) IV    Anti-infectives (From admission, onward)   None        Objective:   Vitals:   07/26/17 1134 07/26/17 2037 07/27/17 0641 07/27/17 1229  BP: (!) 141/90 (!) 143/83 127/82 (!) 143/93  Pulse: 61 74 70 (!) 59  Resp: 20 18 18 20   Temp: 98.2 F (36.8 C) 98.1 F (36.7 C) 98.3 F (36.8 C) 98 F (36.7 C)  TempSrc: Oral Oral Oral Oral  SpO2: 100% 98% 99% 100%  Weight:   78.1 kg (172 lb 1.6 oz)   Height:        Wt Readings from Last 3 Encounters:  07/27/17 78.1 kg (172 lb 1.6 oz)  03/18/17 78.9 kg (174 lb)  01/01/16 76.2 kg (168 lb)     Intake/Output Summary (Last 24 hours) at 07/27/2017 1802 Last data filed at 07/27/2017 1756 Gross per 24 hour  Intake 1200 ml  Output 2300 ml  Net -1100 ml  Physical Exam  Gen:- Awake Alert,  In no apparent distress  HEENT:- [employer].AT, No sclera icterus Neck-Supple Neck,No JVD,.  Lungs-  CTAB , good air movement CV- S1, S2 normal Abd-  +ve B.Sounds, Abd Soft, No tenderness,    Extremity/Skin:- No  edema, good pulses Psych-affect is appropriate, oriented x3 Neuro-no new focal deficits, no tremors   Data Review:   Micro Results No results found for this or any previous visit (from the past 240 hour(s)).  Radiology Reports Dg Chest 2 View  Result Date: 07/24/2017 CLINICAL DATA:  Center chest tightness and a "hot" sensation in the bilateral neck and jaw x2 weeks. Pt denies SOB. Hx of heart palpitations. Nonsmoker. EXAM: CHEST - 2 VIEW COMPARISON:  None. FINDINGS: The heart size and mediastinal contours are within normal limits. Both lungs are clear. The visualized skeletal structures  are unremarkable. IMPRESSION: No active cardiopulmonary disease. Electronically Signed   By: Nolon Nations M.D.   On: 07/24/2017 17:38   Ct Angio Chest Pe W And/or Wo Contrast  Result Date: 07/24/2017 CLINICAL DATA:  Chest pain EXAM: CT ANGIOGRAPHY CHEST WITH CONTRAST TECHNIQUE: Multidetector CT imaging of the chest was performed using the standard protocol during bolus administration of intravenous contrast. Multiplanar CT image reconstructions and MIPs were obtained to evaluate the vascular anatomy. CONTRAST:  65 mL ISOVUE-370 IOPAMIDOL (ISOVUE-370) INJECTION 76% COMPARISON:  Chest x-ray 07/24/2017 FINDINGS: Cardiovascular: Satisfactory opacification of the pulmonary arteries to the segmental level. No evidence of pulmonary embolism. Normal heart size. No pericardial effusion. Nonaneurysmal aorta. No dissection is seen. Mediastinum/Nodes: No enlarged mediastinal, hilar, or axillary lymph nodes. Thyroid gland, trachea, and esophagus demonstrate no significant findings. Lungs/Pleura: Lungs are clear. No pleural effusion or pneumothorax. Upper Abdomen: No acute abnormality. Musculoskeletal: No chest wall abnormality. No acute or significant osseous findings. Review of the MIP images confirms the above findings. IMPRESSION: Negative for acute pulmonary embolus or aortic dissection. Clear lung fields. Electronically Signed   By: Donavan Foil M.D.   On: 07/24/2017 20:28   Ct Coronary Morph W/cta Cor W/score W/ca W/cm &/or Wo/cm  Addendum Date: 07/25/2017   ADDENDUM REPORT: 07/25/2017 17:06 CLINICAL DATA:  Chest pain EXAM: Cardiac CTA MEDICATIONS: Sub lingual nitro. 4mg  x 2 and lopressor 5mg  IV TECHNIQUE: The patient was scanned on a Siemens 401 slice scanner. Gantry rotation speed was 250 msecs. Collimation was 0.6 mm. A 100 kV prospective scan was triggered in the ascending thoracic aorta at 35-75% of the R-R interval. Average HR during the scan was 60 bpm. The 3D data set was interpreted on a dedicated  work station using MPR, MIP and VRT modes. A total of 80cc of contrast was used. FINDINGS: Non-cardiac: See separate report from [facility]. Calcium Score: 74 Agatston units. Coronary Arteries: Right dominant with no anomalies LM: No plaque or stenosis. LAD system: Mixed plaque, mild stenosis proximal LAD. There is artifact obscuring the take-off of moderate D1, unable to comment on disease in this portion of D1, otherwise the vessel is patent with no plaque. Circumflex system: No plaque or stenosis. RCA system: Calcified plaque proximal RCA, mild stenosis. Soft plaque mid RCA, mod-severe stenosis. IMPRESSION: 1. Coronary artery calcium score 74 Agatston units. This places the patient in the 96th percentile for age and gender, suggesting high risk for future cardiac events. 2.  Suspect moderate-severe mid RCA stenosis. [name] Electronically Signed   By: Loralie Champagne M.D.   On: 07/25/2017 17:06   Result Date: 07/25/2017 EXAM: OVER-READ INTERPRETATION  CT CHEST The following report is an over-read performed by radiologist Dr. Vinnie Langton of [facility], [place] on 07/25/2017. This over-read does not include interpretation of cardiac or coronary anatomy or pathology. The coronary calcium score/coronary CTA interpretation by the cardiologist is attached. COMPARISON:  Chest CT 07/24/2017. FINDINGS: Aortic atherosclerosis. Within the visualized portions of the thorax there are no suspicious appearing pulmonary nodules or masses, there is no acute consolidative airspace disease, no pleural effusions, no pneumothorax and no lymphadenopathy. Visualized portions of the upper abdomen are unremarkable. There are no aggressive appearing lytic or blastic lesions noted in the visualized portions of the skeleton. IMPRESSION: 1.  Aortic Atherosclerosis (ICD10-I70.0). Electronically Signed: By: Vinnie Langton M.D. On: 07/25/2017 15:57     CBC Recent Labs  Lab 07/24/17 1701 07/24/17 2258  07/25/17 0444  WBC 5.5 5.4 5.1  HGB 12.8 12.2 11.9*  HCT 38.4 36.9 35.6*  PLT 174 182 164  MCV 93.4 93.2 92.0  MCH 31.1 30.8 30.7  MCHC 33.3 33.1 33.4  RDW 12.1 12.3 12.2    Chemistries  Recent Labs  Lab 07/24/17 1701 07/24/17 2258 07/25/17 0444  NA 139  --  139  K 3.8  --  3.9  CL 105  --  108  CO2 27  --  21*  GLUCOSE 130*  --  87  BUN 15  --  15  CREATININE 0.88 0.94 0.89  CALCIUM 10.0  --  9.0  AST  --  23  --   ALT  --  23  --   ALKPHOS  --  57  --   BILITOT  --  0.5  --    ------------------------------------------------------------------------------------------------------------------ Recent Labs    07/25/17 1051  CHOL 263*  HDL 78  LDLCALC 173*  TRIG 58  CHOLHDL 3.4    No results found for: HGBA1C ------------------------------------------------------------------------------------------------------------------ No results for input(s): TSH, T4TOTAL, T3FREE, THYROIDAB in the last 72 hours.  Invalid input(s): FREET3 ------------------------------------------------------------------------------------------------------------------ No results for input(s): VITAMINB12, FOLATE, FERRITIN, TIBC, IRON, RETICCTPCT in the last 72 hours.  Coagulation profile No results for input(s): INR, PROTIME in the last 168 hours.  Recent Labs    07/24/17 1832  DDIMER 2.93*    Cardiac Enzymes Recent Labs  Lab 07/24/17 2258 07/25/17 0444 07/25/17 1051  TROPONINI <0.03 <0.03 <0.03   ------------------------------------------------------------------------------------------------------------------ No results found for: BNP   Roxan Hockey M.D on 07/27/2017 at 6:02 PM  Between 7am to 7pm - Pager - [phone]  After 7pm go to [email or web address] - password [email or web address]  [facility] -  Office  [phone]   Voice Recognition Viviann Spare dictation system was used to create this note, attempts have been made to correct errors. Please contact the author with questions  and/or clarifications.

## 2017-07-27 NOTE — Progress Notes (Signed)
Progress Note  Patient Name: Nancy Sparks Date of Encounter: 07/27/2017  Primary Cardiologist: Mertie Moores, MD   Subjective   Chest pain has resolved.  Inpatient Medications    Scheduled Meds: . aspirin EC  81 mg Oral Daily  . enoxaparin (LOVENOX) injection  40 mg Subcutaneous Daily  . metoprolol tartrate  25 mg Oral BID  . rosuvastatin  20 mg Oral QHS  . ticagrelor  90 mg Oral BID  . trazodone  100 mg Oral QHS   Continuous Infusions:  PRN Meds: acetaminophen **OR** acetaminophen, ALPRAZolam, metoprolol tartrate, ondansetron **OR** ondansetron (ZOFRAN) IV   Vital Signs    Vitals:   07/26/17 0349 07/26/17 1134 07/26/17 2037 07/27/17 0641  BP:  (!) 141/90 (!) 143/83 127/82  Pulse:  61 74 70  Resp:  20 18 18   Temp:  98.2 F (36.8 C) 98.1 F (36.7 C) 98.3 F (36.8 C)  TempSrc:  Oral Oral Oral  SpO2:  100% 98% 99%  Weight: 171 lb 8 oz (77.8 kg)   172 lb 1.6 oz (78.1 kg)  Height:        Intake/Output Summary (Last 24 hours) at 07/27/2017 1211 Last data filed at 07/27/2017 1037 Gross per 24 hour  Intake 960 ml  Output 2300 ml  Net -1340 ml   Filed Weights   07/25/17 0404 07/26/17 0349 07/27/17 0641  Weight: 173 lb 6.4 oz (78.7 kg) 171 lb 8 oz (77.8 kg) 172 lb 1.6 oz (78.1 kg)    Telemetry    Sinus rhythm- Personally Reviewed  ECG    No new tracing- Personally Reviewed  Physical Exam  Appears comfortable GEN: No acute distress.   Neck: No JVD Cardiac: RRR, no murmurs, rubs, or gallops.  Respiratory: Clear to auscultation bilaterally. GI: Soft, nontender, non-distended  MS: No edema; No deformity. Neuro:  Nonfocal  Psych: Normal affect   Labs    Chemistry Recent Labs  Lab 07/24/17 1701 07/24/17 2258 07/25/17 0444  NA 139  --  139  K 3.8  --  3.9  CL 105  --  108  CO2 27  --  21*  GLUCOSE 130*  --  87  BUN 15  --  15  CREATININE 0.88 0.94 0.89  CALCIUM 10.0  --  9.0  PROT  --  6.5  --   ALBUMIN  --  3.8  --   AST  --  23  --     ALT  --  23  --   ALKPHOS  --  57  --   BILITOT  --  0.5  --   GFRNONAA >60 >60 >60  GFRAA >60 >60 >60  ANIONGAP 7  --  10     Hematology Recent Labs  Lab 07/24/17 1701 07/24/17 2258 07/25/17 0444  WBC 5.5 5.4 5.1  RBC 4.11 3.96 3.87  HGB 12.8 12.2 11.9*  HCT 38.4 36.9 35.6*  MCV 93.4 93.2 92.0  MCH 31.1 30.8 30.7  MCHC 33.3 33.1 33.4  RDW 12.1 12.3 12.2  PLT 174 182 164    Cardiac Enzymes Recent Labs  Lab 07/24/17 2258 07/25/17 0444 07/25/17 1051  TROPONINI <0.03 <0.03 <0.03    Recent Labs  Lab 07/24/17 1703 07/24/17 2011  TROPIPOC 0.00 0.01     BNPNo results for input(s): BNP, PROBNP in the last 168 hours.   DDimer  Recent Labs  Lab 07/24/17 1832  DDIMER 2.93*     Radiology    Ct Coronary  Morph W/cta Cor W/score W/ca W/cm &/or Wo/cm  Addendum Date: 07/25/2017   ADDENDUM REPORT: 07/25/2017 17:06 CLINICAL DATA:  Chest pain EXAM: Cardiac CTA MEDICATIONS: Sub lingual nitro. 4mg  x 2 and lopressor 5mg  IV TECHNIQUE: The patient was scanned on a Siemens 630 slice scanner. Gantry rotation speed was 250 msecs. Collimation was 0.6 mm. A 100 kV prospective scan was triggered in the ascending thoracic aorta at 35-75% of the R-R interval. Average HR during the scan was 60 bpm. The 3D data set was interpreted on a dedicated work station using MPR, MIP and VRT modes. A total of 80cc of contrast was used. FINDINGS: Non-cardiac: See separate report from [facility]. Calcium Score: 74 Agatston units. Coronary Arteries: Right dominant with no anomalies LM: No plaque or stenosis. LAD system: Mixed plaque, mild stenosis proximal LAD. There is artifact obscuring the take-off of moderate D1, unable to comment on disease in this portion of D1, otherwise the vessel is patent with no plaque. Circumflex system: No plaque or stenosis. RCA system: Calcified plaque proximal RCA, mild stenosis. Soft plaque mid RCA, mod-severe stenosis. IMPRESSION: 1. Coronary artery calcium score 74  Agatston units. This places the patient in the 96th percentile for age and gender, suggesting high risk for future cardiac events. 2.  Suspect moderate-severe mid RCA stenosis. [name] Electronically Signed   By: Loralie Champagne M.D.   On: 07/25/2017 17:06   Result Date: 07/25/2017 EXAM: OVER-READ INTERPRETATION  CT CHEST The following report is an over-read performed by radiologist Dr. Vinnie Langton of [facility], [place] on 07/25/2017. This over-read does not include interpretation of cardiac or coronary anatomy or pathology. The coronary calcium score/coronary CTA interpretation by the cardiologist is attached. COMPARISON:  Chest CT 07/24/2017. FINDINGS: Aortic atherosclerosis. Within the visualized portions of the thorax there are no suspicious appearing pulmonary nodules or masses, there is no acute consolidative airspace disease, no pleural effusions, no pneumothorax and no lymphadenopathy. Visualized portions of the upper abdomen are unremarkable. There are no aggressive appearing lytic or blastic lesions noted in the visualized portions of the skeleton. IMPRESSION: 1.  Aortic Atherosclerosis (ICD10-I70.0). Electronically Signed: By: Vinnie Langton M.D. On: 07/25/2017 15:57    Cardiac Studies  ECHO 07/25/2017  - Left ventricle: The cavity size was normal. Systolic function was normal. The estimated ejection fraction was in the range of 55% to 60%. Wall motion was normal; there were no regional wall motion abnormalities. Left ventricular diastolic function parameters were normal.  Patient Profile     52 y.o. female with unstable angina and CT suggestive of high grade stenosis in RCA, waiting for angiography.  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  Assessment & Plan    For diagnostic and probably interventional cardiac catheterization tomorrow, expect angioplasty and stent to  the right coronary artery. This procedure has been fully reviewed with the patient and written informed consent has been obtained. Antiplatelet therapy has already been initiated, aspirin and Brilinta. Continue statin but will have to add PCSK9 inhibitor to achieve target LDL.  For questions or updates, please contact [place] Please consult [email or web address] for contact info under Cardiology/STEMI.      Signed, Sanda Klein, MD  07/27/2017, 12:11 PM

## 2017-07-28 ENCOUNTER — Encounter (HOSPITAL_COMMUNITY): Payer: Self-pay | Admitting: Cardiovascular Disease

## 2017-07-28 ENCOUNTER — Encounter (HOSPITAL_COMMUNITY)
Admission: EM | Disposition: A | Discharge status: Home / Self Care | Payer: Self-pay | Source: Home / Self Care | Attending: Family Medicine

## 2017-07-28 DIAGNOSIS — I208 Other forms of angina pectoris: Secondary | ICD-10-CM

## 2017-07-28 DIAGNOSIS — Z9114 Patient's other noncompliance with medication regimen: Secondary | ICD-10-CM | POA: Diagnosis not present

## 2017-07-28 DIAGNOSIS — I25118 Atherosclerotic heart disease of native coronary artery with other forms of angina pectoris: Secondary | ICD-10-CM

## 2017-07-28 DIAGNOSIS — Z8249 Family history of ischemic heart disease and other diseases of the circulatory system: Secondary | ICD-10-CM | POA: Diagnosis not present

## 2017-07-28 DIAGNOSIS — E7801 Familial hypercholesterolemia: Secondary | ICD-10-CM | POA: Diagnosis not present

## 2017-07-28 DIAGNOSIS — K219 Gastro-esophageal reflux disease without esophagitis: Secondary | ICD-10-CM | POA: Diagnosis present

## 2017-07-28 DIAGNOSIS — Z79890 Hormone replacement therapy (postmenopausal): Secondary | ICD-10-CM | POA: Diagnosis not present

## 2017-07-28 DIAGNOSIS — Z91040 Latex allergy status: Secondary | ICD-10-CM | POA: Diagnosis not present

## 2017-07-28 DIAGNOSIS — R079 Chest pain, unspecified: Secondary | ICD-10-CM | POA: Diagnosis present

## 2017-07-28 DIAGNOSIS — E785 Hyperlipidemia, unspecified: Secondary | ICD-10-CM | POA: Diagnosis not present

## 2017-07-28 DIAGNOSIS — F419 Anxiety disorder, unspecified: Secondary | ICD-10-CM | POA: Diagnosis not present

## 2017-07-28 DIAGNOSIS — Z79899 Other long term (current) drug therapy: Secondary | ICD-10-CM | POA: Diagnosis not present

## 2017-07-28 DIAGNOSIS — Z86010 Personal history of colonic polyps: Secondary | ICD-10-CM | POA: Diagnosis not present

## 2017-07-28 DIAGNOSIS — I10 Essential (primary) hypertension: Secondary | ICD-10-CM | POA: Diagnosis present

## 2017-07-28 DIAGNOSIS — I25110 Atherosclerotic heart disease of native coronary artery with unstable angina pectoris: Secondary | ICD-10-CM | POA: Diagnosis not present

## 2017-07-28 DIAGNOSIS — Z91048 Other nonmedicinal substance allergy status: Secondary | ICD-10-CM | POA: Diagnosis not present

## 2017-07-28 DIAGNOSIS — I200 Unstable angina: Secondary | ICD-10-CM | POA: Diagnosis not present

## 2017-07-28 DIAGNOSIS — F329 Major depressive disorder, single episode, unspecified: Secondary | ICD-10-CM | POA: Diagnosis present

## 2017-07-28 HISTORY — PX: CORONARY STENT INTERVENTION: CATH118234

## 2017-07-28 HISTORY — PX: LEFT HEART CATH AND CORONARY ANGIOGRAPHY: CATH118249

## 2017-07-28 LAB — CBC
HEMATOCRIT: 37.9 % (ref 36.0–46.0)
Hemoglobin: 12.5 g/dL (ref 12.0–15.0)
MCH: 31.1 pg (ref 26.0–34.0)
MCHC: 33 g/dL (ref 30.0–36.0)
MCV: 94.3 fL (ref 78.0–100.0)
Platelets: 161 10*3/uL (ref 150–400)
RBC: 4.02 MIL/uL (ref 3.87–5.11)
RDW: 12.1 % (ref 11.5–15.5)
WBC: 4.9 10*3/uL (ref 4.0–10.5)

## 2017-07-28 LAB — BASIC METABOLIC PANEL
ANION GAP: 6 (ref 5–15)
BUN: 15 mg/dL (ref 6–20)
CALCIUM: 9.1 mg/dL (ref 8.9–10.3)
CO2: 25 mmol/L (ref 22–32)
Chloride: 109 mmol/L (ref 101–111)
Creatinine, Ser: 0.85 mg/dL (ref 0.44–1.00)
GFR calc Af Amer: >60 mL/min (ref >60)
Glucose, Bld: 113 mg/dL — ABNORMAL HIGH (ref 65–99)
POTASSIUM: 4.1 mmol/L (ref 3.5–5.1)
SODIUM: 140 mmol/L (ref 135–145)

## 2017-07-28 LAB — POCT ACTIVATED CLOTTING TIME
ACTIVATED CLOTTING TIME: 301 s
Activated Clotting Time: 296 seconds

## 2017-07-28 SURGERY — LEFT HEART CATH AND CORONARY ANGIOGRAPHY
Anesthesia: LOCAL

## 2017-07-28 MED ORDER — MIDAZOLAM HCL 2 MG/2ML IJ SOLN
INTRAMUSCULAR | Status: AC
  Filled 2017-07-28: qty 2

## 2017-07-28 MED ORDER — ONDANSETRON HCL 4 MG/2ML IJ SOLN: 4.0000 mg | Freq: Four times a day (QID) | INTRAMUSCULAR | Status: DC | PRN

## 2017-07-28 MED ORDER — HYDRALAZINE HCL 20 MG/ML IJ SOLN: 5.0000 mg | INTRAMUSCULAR | Status: AC | PRN

## 2017-07-28 MED ORDER — LABETALOL HCL 5 MG/ML IV SOLN: 10.0000 mg | INTRAVENOUS | Status: AC | PRN

## 2017-07-28 MED ORDER — SODIUM CHLORIDE 0.9 % IV SOLN: 250.0000 mL | INTRAVENOUS | Status: DC | PRN

## 2017-07-28 MED ORDER — HEART ATTACK BOUNCING BOOK
Freq: Once | Status: AC
  Administered 2017-07-28: 22:00:00
  Filled 2017-07-28: qty 1

## 2017-07-28 MED ORDER — ROSUVASTATIN CALCIUM 40 MG PO TABS
40.0000 mg | ORAL_TABLET | Freq: Every day | ORAL | Status: DC
  Administered 2017-07-28: 22:00:00 40 mg via ORAL
  Filled 2017-07-28: qty 2
  Filled 2017-07-28: qty 1

## 2017-07-28 MED ORDER — NITROGLYCERIN 1 MG/10 ML FOR IR/CATH LAB
INTRA_ARTERIAL | Status: DC | PRN
  Administered 2017-07-28 (×4): 200 ug via INTRACORONARY

## 2017-07-28 MED ORDER — SODIUM CHLORIDE 0.9% FLUSH: 3.0000 mL | INTRAVENOUS | Status: DC | PRN

## 2017-07-28 MED ORDER — VERAPAMIL HCL 2.5 MG/ML IV SOLN
INTRAVENOUS | Status: DC | PRN
  Administered 2017-07-28: 09:00:00 via INTRA_ARTERIAL

## 2017-07-28 MED ORDER — ISOSORBIDE MONONITRATE ER 30 MG PO TB24
30.0000 mg | ORAL_TABLET | Freq: Every day | ORAL | Status: DC
  Administered 2017-07-28 – 2017-07-29 (×2): 30 mg via ORAL
  Filled 2017-07-28 (×2): qty 1

## 2017-07-28 MED ORDER — TICAGRELOR 90 MG PO TABS
ORAL_TABLET | ORAL | Status: DC | PRN
  Administered 2017-07-28: 90 mg via ORAL

## 2017-07-28 MED ORDER — FENTANYL CITRATE (PF) 100 MCG/2ML IJ SOLN
INTRAMUSCULAR | Status: DC | PRN
  Administered 2017-07-28: 50 ug via INTRAVENOUS
  Administered 2017-07-28 (×2): 25 ug via INTRAVENOUS

## 2017-07-28 MED ORDER — SODIUM CHLORIDE 0.9 % WEIGHT BASED INFUSION: 3.0000 mL/kg/h | INTRAVENOUS | Status: DC

## 2017-07-28 MED ORDER — SODIUM CHLORIDE 0.9 % IV SOLN: INTRAVENOUS | Status: AC

## 2017-07-28 MED ORDER — HEPARIN (PORCINE) IN NACL 2-0.9 UNITS/ML
INTRAMUSCULAR | Status: AC | PRN
  Administered 2017-07-28: 1000 mL

## 2017-07-28 MED ORDER — ASPIRIN 81 MG PO CHEW
81.0000 mg | CHEWABLE_TABLET | ORAL | Status: AC
  Administered 2017-07-28: 81 mg via ORAL
  Filled 2017-07-28: qty 1

## 2017-07-28 MED ORDER — LIDOCAINE HCL (PF) 1 % IJ SOLN
INTRAMUSCULAR | Status: DC | PRN
  Administered 2017-07-28: 2 mL

## 2017-07-28 MED ORDER — TICAGRELOR 90 MG PO TABS: 90.0000 mg | ORAL_TABLET | Freq: Two times a day (BID) | ORAL | Status: DC

## 2017-07-28 MED ORDER — ASPIRIN 81 MG PO CHEW: 81.0000 mg | CHEWABLE_TABLET | Freq: Every day | ORAL | Status: DC

## 2017-07-28 MED ORDER — SODIUM CHLORIDE 0.9 % WEIGHT BASED INFUSION
1.0000 mL/kg/h | INTRAVENOUS | Status: DC
  Administered 2017-07-28: 1 mL/kg/h via INTRAVENOUS

## 2017-07-28 MED ORDER — IOHEXOL 350 MG/ML SOLN
INTRAVENOUS | Status: DC | PRN
  Administered 2017-07-28: 100 mL via INTRAVENOUS

## 2017-07-28 MED ORDER — VERAPAMIL HCL 2.5 MG/ML IV SOLN
INTRAVENOUS | Status: AC
  Filled 2017-07-28: qty 2

## 2017-07-28 MED ORDER — TICAGRELOR 90 MG PO TABS
ORAL_TABLET | ORAL | Status: AC
  Filled 2017-07-28: qty 2

## 2017-07-28 MED ORDER — DIAZEPAM 5 MG PO TABS
5.0000 mg | ORAL_TABLET | Freq: Four times a day (QID) | ORAL | Status: DC | PRN
  Administered 2017-07-28: 5 mg via ORAL
  Filled 2017-07-28: qty 1

## 2017-07-28 MED ORDER — HEPARIN (PORCINE) IN NACL 1000-0.9 UT/500ML-% IV SOLN
INTRAVENOUS | Status: AC
  Filled 2017-07-28: qty 1000

## 2017-07-28 MED ORDER — SODIUM CHLORIDE 0.9% FLUSH
3.0000 mL | Freq: Two times a day (BID) | INTRAVENOUS | Status: DC
  Administered 2017-07-28 (×2): 3 mL via INTRAVENOUS

## 2017-07-28 MED ORDER — SODIUM CHLORIDE 0.9 % IV SOLN
INTRAVENOUS | Status: AC
  Administered 2017-07-28: 15:00:00 via INTRAVENOUS

## 2017-07-28 MED ORDER — NITROGLYCERIN 1 MG/10 ML FOR IR/CATH LAB
INTRA_ARTERIAL | Status: AC
  Filled 2017-07-28: qty 10

## 2017-07-28 MED ORDER — FENTANYL CITRATE (PF) 100 MCG/2ML IJ SOLN
INTRAMUSCULAR | Status: AC
  Filled 2017-07-28: qty 2

## 2017-07-28 MED ORDER — HEPARIN SODIUM (PORCINE) 1000 UNIT/ML IJ SOLN
INTRAMUSCULAR | Status: DC | PRN
  Administered 2017-07-28: 6000 [IU] via INTRAVENOUS
  Administered 2017-07-28: 4000 [IU] via INTRAVENOUS

## 2017-07-28 MED ORDER — HEPARIN SODIUM (PORCINE) 1000 UNIT/ML IJ SOLN
INTRAMUSCULAR | Status: AC
  Filled 2017-07-28: qty 1

## 2017-07-28 MED ORDER — SODIUM CHLORIDE 0.9% FLUSH: 3.0000 mL | Freq: Two times a day (BID) | INTRAVENOUS | Status: DC

## 2017-07-28 MED ORDER — MIDAZOLAM HCL 2 MG/2ML IJ SOLN
INTRAMUSCULAR | Status: DC | PRN
  Administered 2017-07-28 (×2): 1 mg via INTRAVENOUS
  Administered 2017-07-28: 2 mg via INTRAVENOUS
  Administered 2017-07-28: 1 mg via INTRAVENOUS

## 2017-07-28 MED ORDER — LIDOCAINE HCL (PF) 1 % IJ SOLN
INTRAMUSCULAR | Status: AC
  Filled 2017-07-28: qty 30

## 2017-07-28 MED ORDER — ANGIOPLASTY BOOK
Freq: Once | Status: DC
  Filled 2017-07-28: qty 1

## 2017-07-28 MED ORDER — ACETAMINOPHEN 325 MG PO TABS
650.0000 mg | ORAL_TABLET | ORAL | Status: DC | PRN
  Administered 2017-07-28 (×2): 650 mg via ORAL
  Filled 2017-07-28 (×2): qty 2

## 2017-07-28 SURGICAL SUPPLY — 17 items
BALLN SAPPHIRE 2.5X12 (BALLOONS) ×2
BALLN SAPPHIRE ~~LOC~~ 3.25X15 (BALLOONS) ×2 IMPLANT
BALLOON SAPPHIRE 2.5X12 (BALLOONS) ×1 IMPLANT
CATH LAUNCHER 6FR JR4 (CATHETERS) ×2 IMPLANT
CATH OPTITORQUE TIG 4.0 5F (CATHETERS) ×2 IMPLANT
DEVICE RAD COMP TR BAND LRG (VASCULAR PRODUCTS) ×2 IMPLANT
GLIDESHEATH SLEND SS 6F .021 (SHEATH) ×2 IMPLANT
GUIDEWIRE INQWIRE 1.5J.035X260 (WIRE) ×1 IMPLANT
INQWIRE 1.5J .035X260CM (WIRE) ×2
KIT ENCORE 26 ADVANTAGE (KITS) ×2 IMPLANT
KIT HEART LEFT (KITS) ×2 IMPLANT
PACK CARDIAC CATHETERIZATION (CUSTOM PROCEDURE TRAY) ×2 IMPLANT
STENT RESOLUTE ONYX 3.0X18 (Permanent Stent) ×2 IMPLANT
TRANSDUCER W/STOPCOCK (MISCELLANEOUS) ×2 IMPLANT
TUBING CIL FLEX 10 FLL-RA (TUBING) ×2 IMPLANT
WIRE ASAHI PROWATER 180CM (WIRE) ×2 IMPLANT
WIRE HI TORQ VERSACORE-J 145CM (WIRE) ×2 IMPLANT

## 2017-07-28 NOTE — Progress Notes (Signed)
TRB BAND REMOVAL  LOCATION:    right radial  DEFLATED PER PROTOCOL:    Yes.    TIME BAND OFF / DRESSING APPLIED: 1400/ 1500  SITE UPON ARRIVAL:    Level 0  SITE AFTER BAND REMOVAL:    Level 0  CIRCULATION SENSATION AND MOVEMENT:    Within Normal Limits   Yes.    COMMENTS:   Tolerated well, DSD applied, post TRB instructions given

## 2017-07-28 NOTE — Progress Notes (Addendum)
   Patient underwent LHC with Mid RCA angioplasty and stenting on 07/28/2017  Patient was transferred to cardiology service as of 07/28/17  Further treatment management and disposition as per cardiology service  [facility] service signs off, please recall [place] if needed  Roxan Hockey, MD

## 2017-07-28 NOTE — Interval H&P Note (Signed)
Cath Lab Visit (complete for each Cath Lab visit)  Clinical Evaluation Leading to the Procedure:   ACS: No.  Non-ACS:    Anginal Classification: CCS III  Anti-ischemic medical therapy: Minimal Therapy (1 class of medications)  Non-Invasive Test Results: Intermediate-risk stress test findings: cardiac mortality 1-3%/year  Prior CABG: No previous CABG      History and Physical Interval Note:  07/28/2017 8:44 AM  Lesia W Brinkley  has presented today for surgery, with the diagnosis of cp  The various methods of treatment have been discussed with the patient and family. After consideration of risks, benefits and other options for treatment, the patient has consented to  Procedure(s): LEFT HEART CATH AND CORONARY ANGIOGRAPHY (N/A) as a surgical intervention .  The patient's history has been reviewed, patient examined, no change in status, stable for surgery.  I have reviewed the patient's chart and labs.  Questions were answered to the patient's satisfaction.     Shelva Majestic

## 2017-07-29 ENCOUNTER — Telehealth: Payer: Self-pay | Admitting: Cardiovascular Disease

## 2017-07-29 DIAGNOSIS — I200 Unstable angina: Secondary | ICD-10-CM

## 2017-07-29 LAB — BASIC METABOLIC PANEL
Anion gap: 5 (ref 5–15)
BUN: 14 mg/dL (ref 6–20)
CHLORIDE: 113 mmol/L — AB (ref 98–111)
CO2: 21 mmol/L — ABNORMAL LOW (ref 22–32)
CREATININE: 0.83 mg/dL (ref 0.44–1.00)
Calcium: 8.5 mg/dL — ABNORMAL LOW (ref 8.9–10.3)
GFR calc Af Amer: >60 mL/min (ref >60)
GFR calc non Af Amer: >60 mL/min (ref >60)
GLUCOSE: 113 mg/dL — AB (ref 70–99)
POTASSIUM: 3.9 mmol/L (ref 3.5–5.1)
SODIUM: 139 mmol/L (ref 135–145)

## 2017-07-29 LAB — CBC
HEMATOCRIT: 34.2 % — AB (ref 36.0–46.0)
Hemoglobin: 11.2 g/dL — ABNORMAL LOW (ref 12.0–15.0)
MCH: 30.7 pg (ref 26.0–34.0)
MCHC: 32.7 g/dL (ref 30.0–36.0)
MCV: 93.7 fL (ref 78.0–100.0)
PLATELETS: 146 10*3/uL — AB (ref 150–400)
RBC: 3.65 MIL/uL — ABNORMAL LOW (ref 3.87–5.11)
RDW: 12.2 % (ref 11.5–15.5)
WBC: 5.2 10*3/uL (ref 4.0–10.5)

## 2017-07-29 MED ORDER — TICAGRELOR 90 MG PO TABS: 90.0000 mg | ORAL_TABLET | Freq: Two times a day (BID) | ORAL | 0 refills | Status: DC

## 2017-07-29 MED ORDER — METOPROLOL TARTRATE 25 MG PO TABS: 25.0000 mg | ORAL_TABLET | Freq: Two times a day (BID) | ORAL | 3 refills | Status: DC

## 2017-07-29 MED ORDER — TICAGRELOR 90 MG PO TABS: 90.0000 mg | ORAL_TABLET | Freq: Two times a day (BID) | ORAL | 3 refills | Status: DC

## 2017-07-29 MED ORDER — ROSUVASTATIN CALCIUM 40 MG PO TABS: 40.0000 mg | ORAL_TABLET | Freq: Every day | ORAL | 3 refills | Status: DC

## 2017-07-29 MED ORDER — ASPIRIN 81 MG PO TBEC: 81.0000 mg | DELAYED_RELEASE_TABLET | Freq: Every day | ORAL | 3 refills | Status: DC

## 2017-07-29 MED ORDER — ISOSORBIDE MONONITRATE ER 30 MG PO TB24: 30.0000 mg | ORAL_TABLET | Freq: Every day | ORAL | 3 refills | Status: DC

## 2017-07-29 NOTE — Discharge Summary (Signed)
Discharge Summary    Patient ID: [name],  MRN: [id number], DOB/AGE: 02/16/1965 52 y.o.  Admit date: 07/24/2017 Discharge date: 07/29/2017  Primary Care Provider: Haywood Pao Primary Cardiologist: Mertie Moores, MD  Discharge Diagnoses    Principal Problem:   Chest pain Active Problems:   HLD (hyperlipidemia)   Coronary artery disease involving native coronary artery of native heart with unstable angina pectoris (HCC)   Allergies Allergies  Allergen Reactions  . Latex Rash  . Tape Rash    Paper tape ok    Diagnostic Studies/Procedures    CORONARY STENT INTERVENTION  LEFT HEART CATH AND CORONARY ANGIOGRAPHY  07/28/17  Conclusion    Prox LAD lesion is 20% stenosed.  Ost 1st Diag lesion is 75% stenosed.  Prox RCA lesion is 20% stenosed.  Mid RCA lesion is 90% stenosed.  Post intervention, there is a 0% residual stenosis.  A stent was successfully placed.   Two-vessel coronary artery disease with 75% ostial diagonal 1 stenosis with smooth 20% narrowing in the LAD immediately after the diagonal takeoff; normal left circumflex coronary artery; mild eccentric calcification in the very proximal RCA with 20% narrowing and 90% proximal to mid focal stenosis.  LVEDP 7 mmHg.  Successful percutaneous coronary intervention to the RCA with ultimate insertion of a 3.0 x 18 mm Resolute Onyx DES stent postdilated to 3.25 mm with a 90% stenosis being reduced to 0%.  RECOMMENDATION: Recommend DAPT therapy for minimum of 1 year.  Initial medical management for her diagonal stenosis and will add low-dose nitrates to her beta-blocker regimen.  The patient requires aggressive lipid intervention and with her high likelihood of familial hyperlipidemia based on her significant LDL elevations in the past, would recommend PCSK9 inhibition with concomitant statin therapy for optimal treatment.    Diagnostic Diagram       Post-Intervention Diagram         _____________  Echocardiogram 07/25/17 Study Conclusions  - Left ventricle: The cavity size was normal. Systolic function was   normal. The estimated ejection fraction was in the range of 55%   to 60%. Wall motion was normal; there were no regional wall   motion abnormalities. Left ventricular diastolic function   parameters were normal. - Aortic valve: Valve area (VTI): 2.23 cm^2. Valve area (Vmax):   2.03 cm^2. Valve area (Vmean): 2.18 cm^2.  _______________  Cardiac CTA 07/25/2017 IMPRESSION: 1. Coronary artery calcium score 74 Agatston units. This places the patient in the 96th percentile for age and gender, suggesting high risk for future cardiac events.  2.  Suspect moderate-severe mid RCA stenosis.  Nancy Sparks     History of Present Illness      Nancy Sparks is a 52 y.o. female with history of hyperlipidemia presents with complaint of chest pain.  Patient has been having chest pain off and on for last 2 weeks.  Chest pain is retrosternal radiating to her neck.  Last for 5 to 10 minutes each time.  Patient placed in his every day and has not been having any chest pain limiting the play.  Hospital Course     Consultants: none  The patient continued to have atypical mild intermittent chest discomfort, chest negative for PE, ruled out for ACS by cardiac enzymes and EKG, CT coronary angiogram showed high-grade RCA stenosis with high risk features. She was taken to the cath lab on 07/28/17 and found to have 90% stenosed mid RCA and DES was placed. She also  has 75% stenosed 1st diagonal.   Recommend DAPT therapy for minimum of 1 year. She has been started on Brilinta and aspirin.  Initial medical management for her diagonal stenosis and low-dose nitrates added to her beta-blocker regimen.   The patient requires aggressive lipid intervention and with her high likelihood of familial hyperlipidemia based on her significant LDL elevations in the past, would recommend PCSK9  inhibition with concomitant statin therapy for optimal treatment.  Echo showed normal LV systolic function with EF 55-60%.  Labs are stable today. SCr 0.83, Hgb 11.2.   Patient has been seen by Dr. Johnsie Cancel today and deemed ready for discharge home. All follow up appointments have been scheduled. Discharge medications are listed below. _____________  Discharge Vitals Blood pressure (!) 147/85, pulse 75, temperature 98.4 F (36.9 C), resp. rate 14, height 5\' 6"  (1.676 m), weight 169 lb 12.1 oz (77 kg), SpO2 100 %.  Filed Weights   07/27/17 0641 07/28/17 0620 07/29/17 0616  Weight: 172 lb 1.6 oz (78.1 kg) 172 lb 14.4 oz (78.4 kg) 169 lb 12.1 oz (77 kg)    Labs & Radiologic Studies    CBC Recent Labs    07/28/17 0710 07/29/17 0312  WBC 4.9 5.2  HGB 12.5 11.2*  HCT 37.9 34.2*  MCV 94.3 93.7  PLT 161 209*   Basic Metabolic Panel Recent Labs    07/28/17 0710 07/29/17 0312  NA 140 139  K 4.1 3.9  CL 109 113*  CO2 25 21*  GLUCOSE 113* 113*  BUN 15 14  CREATININE 0.85 0.83  CALCIUM 9.1 8.5*   Liver Function Tests No results for input(s): AST, ALT, ALKPHOS, BILITOT, PROT, ALBUMIN in the last 72 hours. No results for input(s): LIPASE, AMYLASE in the last 72 hours. Cardiac Enzymes No results for input(s): CKTOTAL, CKMB, CKMBINDEX, TROPONINI in the last 72 hours. BNP Invalid input(s): POCBNP D-Dimer No results for input(s): DDIMER in the last 72 hours. Hemoglobin A1C No results for input(s): HGBA1C in the last 72 hours. Fasting Lipid Panel No results for input(s): CHOL, HDL, LDLCALC, TRIG, CHOLHDL, LDLDIRECT in the last 72 hours. Thyroid Function Tests No results for input(s): TSH, T4TOTAL, T3FREE, THYROIDAB in the last 72 hours.  Invalid input(s): FREET3 _____________  Dg Chest 2 View  Result Date: 07/24/2017 CLINICAL DATA:  Center chest tightness and a "hot" sensation in the bilateral neck and jaw x2 weeks. Pt denies SOB. Hx of heart palpitations. Nonsmoker. EXAM:  CHEST - 2 VIEW COMPARISON:  None. FINDINGS: The heart size and mediastinal contours are within normal limits. Both lungs are clear. The visualized skeletal structures are unremarkable. IMPRESSION: No active cardiopulmonary disease. Electronically Signed   By: Nolon Nations M.D.   On: 07/24/2017 17:38   Ct Angio Chest Pe W And/or Wo Contrast  Result Date: 07/24/2017 CLINICAL DATA:  Chest pain EXAM: CT ANGIOGRAPHY CHEST WITH CONTRAST TECHNIQUE: Multidetector CT imaging of the chest was performed using the standard protocol during bolus administration of intravenous contrast. Multiplanar CT image reconstructions and MIPs were obtained to evaluate the vascular anatomy. CONTRAST:  65 mL ISOVUE-370 IOPAMIDOL (ISOVUE-370) INJECTION 76% COMPARISON:  Chest x-ray 07/24/2017 FINDINGS: Cardiovascular: Satisfactory opacification of the pulmonary arteries to the segmental level. No evidence of pulmonary embolism. Normal heart size. No pericardial effusion. Nonaneurysmal aorta. No dissection is seen. Mediastinum/Nodes: No enlarged mediastinal, hilar, or axillary lymph nodes. Thyroid gland, trachea, and esophagus demonstrate no significant findings. Lungs/Pleura: Lungs are clear. No pleural effusion or pneumothorax. Upper Abdomen: No acute  abnormality. Musculoskeletal: No chest wall abnormality. No acute or significant osseous findings. Review of the MIP images confirms the above findings. IMPRESSION: Negative for acute pulmonary embolus or aortic dissection. Clear lung fields. Electronically Signed   By: Donavan Foil M.D.   On: 07/24/2017 20:28   Ct Coronary Morph W/cta Cor W/score W/ca W/cm &/or Wo/cm  Addendum Date: 07/25/2017   ADDENDUM REPORT: 07/25/2017 17:06 CLINICAL DATA:  Chest pain EXAM: Cardiac CTA MEDICATIONS: Sub lingual nitro. 4mg  x 2 and lopressor 5mg  IV TECHNIQUE: The patient was scanned on a Siemens 720 slice scanner. Gantry rotation speed was 250 msecs. Collimation was 0.6 mm. A 100 kV prospective scan  was triggered in the ascending thoracic aorta at 35-75% of the R-R interval. Average HR during the scan was 60 bpm. The 3D data set was interpreted on a dedicated work station using MPR, MIP and VRT modes. A total of 80cc of contrast was used. FINDINGS: Non-cardiac: See separate report from [facility]. Calcium Score: 74 Agatston units. Coronary Arteries: Right dominant with no anomalies LM: No plaque or stenosis. LAD system: Mixed plaque, mild stenosis proximal LAD. There is artifact obscuring the take-off of moderate D1, unable to comment on disease in this portion of D1, otherwise the vessel is patent with no plaque. Circumflex system: No plaque or stenosis. RCA system: Calcified plaque proximal RCA, mild stenosis. Soft plaque mid RCA, mod-severe stenosis. IMPRESSION: 1. Coronary artery calcium score 74 Agatston units. This places the patient in the 96th percentile for age and gender, suggesting high risk for future cardiac events. 2.  Suspect moderate-severe mid RCA stenosis. Nancy Sparks Electronically Signed   By: Loralie Champagne M.D.   On: 07/25/2017 17:06   Result Date: 07/25/2017 EXAM: OVER-READ INTERPRETATION  CT CHEST The following report is an over-read performed by radiologist Dr. Vinnie Langton of [facility], [place] on 07/25/2017. This over-read does not include interpretation of cardiac or coronary anatomy or pathology. The coronary calcium score/coronary CTA interpretation by the cardiologist is attached. COMPARISON:  Chest CT 07/24/2017. FINDINGS: Aortic atherosclerosis. Within the visualized portions of the thorax there are no suspicious appearing pulmonary nodules or masses, there is no acute consolidative airspace disease, no pleural effusions, no pneumothorax and no lymphadenopathy. Visualized portions of the upper abdomen are unremarkable. There are no aggressive appearing lytic or blastic lesions noted in the visualized portions of the skeleton. IMPRESSION: 1.  Aortic  Atherosclerosis (ICD10-I70.0). Electronically Signed: By: Vinnie Langton M.D. On: 07/25/2017 15:57   Disposition   Pt is being discharged home today in good condition.  Follow-up Plans & Appointments    Follow-up Information    Tisovec, Fransico Him, MD.   Specialty:  Internal Medicine Contact information: [address] [place] [address] [phone]        Richardson Dopp T, PA-C Follow up.   Specialties:  Cardiology, Physician Assistant Why:  Cardiology hospital follow up on July 9th at 8:45. Please arrive 15 minutes early for check in. Contact information: [address] [place] [address] [phone]          Discharge Instructions    AMB Referral to Cardiac Rehabilitation - Phase II   Complete by:  As directed    Diagnosis:  PTCA   Diet - low sodium heart healthy   Complete by:  As directed    Discharge instructions   Complete by:  As directed    [place]  FOLLOW-UP OFFICE VISITS.  PLEASE ATTEND ALL SCHEDULED FOLLOW-UP APPOINTMENTS.   Activity: Increase activity slowly as tolerated. You may shower, but no soaking baths (or swimming) for 1 week. No driving for 24 hours. No lifting over 5 lbs for 1 week. No sexual activity for 1 week.   You May Return to Work: in 1 week (if applicable)  Wound Care: You may wash cath site gently with soap and water. Keep cath site clean and dry. If you notice pain, swelling, bleeding or pus at your cath site, please call [phone].   Increase activity slowly   Complete by:  As directed       Discharge Medications   Allergies as of 07/29/2017      Reactions   Latex Rash   Tape Rash   Paper tape ok      Medication List    TAKE these medications   ALPRAZolam 0.25 MG tablet Commonly known as:  XANAX Take 1 tablet (0.25 mg total) by mouth at bedtime as needed for anxiety.   aspirin 81 MG EC tablet Take 1 tablet (81 mg total) by mouth  daily.   estradiol 0.05 MG/24HR patch Commonly known as:  VIVELLE-DOT Place 1 patch (0.05 mg total) onto the skin 2 (two) times a week.   ibuprofen 200 MG tablet Commonly known as:  ADVIL,MOTRIN Take 600-800 mg by mouth every 6 (six) hours as needed (pain).   isosorbide mononitrate 30 MG 24 hr tablet Commonly known as:  IMDUR Take 1 tablet (30 mg total) by mouth daily.   metoprolol tartrate 25 MG tablet Commonly known as:  LOPRESSOR Take 1 tablet (25 mg total) by mouth 2 (two) times daily.   progesterone 200 MG capsule Commonly known as:  PROMETRIUM Take 1 capsule (200 mg total) by mouth daily. Take one tablet the first 12 days of the month What changed:    when to take this  additional instructions   rosuvastatin 40 MG tablet Commonly known as:  CRESTOR Take 1 tablet (40 mg total) by mouth at bedtime. What changed:    medication strength  how much to take  Another medication with the same name was removed. Continue taking this medication, and follow the directions you see here.   ticagrelor 90 MG Tabs tablet Commonly known as:  BRILINTA Take 1 tablet (90 mg total) by mouth 2 (two) times daily.   trazodone 300 MG tablet Commonly known as:  DESYREL Take 1 tablet (300 mg total) by mouth at bedtime. Take 1/2 tablet bedtime as needed What changed:    how much to take  additional instructions   VISINE OP Place 1 drop into both eyes daily as needed (dry eyes).        Acute coronary syndrome (MI, NSTEMI, STEMI, etc) this admission?: Yes.     AHA/ACC Clinical Performance & Quality Measures: 1. Aspirin prescribed? - Yes 2. ADP Receptor Inhibitor (Plavix/Clopidogrel, Brilinta/Ticagrelor or Effient/Prasugrel) prescribed (includes medically managed patients)? - Yes 3. Beta Blocker prescribed? - Yes 4. High Intensity Statin (Lipitor 40-80mg  or Crestor 20-40mg ) prescribed? - Yes 5. EF assessed during THIS hospitalization? - Yes 6. For EF <40%, was ACEI/ARB  prescribed? - Not Applicable (EF >/= 79%) 7. For EF <40%, Aldosterone Antagonist (Spironolactone or Eplerenone) prescribed? - Not Applicable (EF >/= 89%) 8. Cardiac Rehab Phase II ordered (Included Medically managed Patients)? - Yes     Outstanding Labs/Studies   None  Duration of Discharge Encounter   Greater than 30 minutes including physician  time.  Signed, Luna Fuse, NP 07/29/2017, 8:24 AM

## 2017-07-29 NOTE — Progress Notes (Signed)
D/c per Cardiology service  Roxan Hockey, MD

## 2017-07-29 NOTE — Telephone Encounter (Signed)
New Message   [facility] appointment made on 08/12/2017 at 8:45 with Richardson Dopp per Pecolia Ades

## 2017-07-29 NOTE — Progress Notes (Signed)
CARDIAC REHAB PHASE I   PRE:  Rate/Rhythm: 76 SR  BP:  Sitting: 143/87      SaO2: 99 RA  MODE:  Ambulation: 600 ft 106 peak HR  POST:  Rate/Rhythm: 89 SR  BP:  Sitting: 164/90    SaO2: 100 RA  Pt ambulated 654ft independently with steady gait. No c/o CP or SOB. Educated pt and husband on importance of Brilinta, ASA, statin and NTG. Pt has stent card at bedside. Heart healthy diet given to pt. Reviewed restrictions and exercise guidelines with pt. Will refer for CRP II [place]. Pt anxious of d/c.  [id number] Rufina Falco, RN BSN 07/29/2017 8:37 AM

## 2017-07-29 NOTE — Progress Notes (Signed)
Co-pay for Briilinta 90mg .bid for 30 day supply $ 65.00.  No PA required  Pharmacies: [facility].

## 2017-07-29 NOTE — Progress Notes (Signed)
Progress Note  Patient Name: Nancy Sparks Date of Encounter: 07/29/2017  Primary Cardiologist: Mertie Moores, MD   Subjective   No angina post intervention Feels face is swollen from hydration   Inpatient Medications    Scheduled Meds: . angioplasty book   Does not apply Once  . aspirin EC  81 mg Oral Daily  . enoxaparin (LOVENOX) injection  40 mg Subcutaneous Daily  . isosorbide mononitrate  30 mg Oral Daily  . metoprolol tartrate  25 mg Oral BID  . rosuvastatin  40 mg Oral QHS  . sodium chloride flush  3 mL Intravenous Q12H  . ticagrelor  90 mg Oral BID  . trazodone  100 mg Oral QHS   Continuous Infusions: . sodium chloride    . sodium chloride 75 mL/hr at 07/28/17 2201   PRN Meds: sodium chloride, acetaminophen, ALPRAZolam, diazepam, metoprolol tartrate, ondansetron **OR** ondansetron (ZOFRAN) IV, sodium chloride flush   Vital Signs    Vitals:   07/28/17 1953 07/28/17 2000 07/28/17 2153 07/29/17 0616  BP: 106/75  (!) 98/59 (!) 147/85  Pulse: 66  64 (!) 58  Resp: 16 13  14   Temp: 98.2 F (36.8 C)   98.2 F (36.8 C)  TempSrc: Oral   Oral  SpO2: 97%   98%  Weight:    169 lb 12.1 oz (77 kg)  Height:        Intake/Output Summary (Last 24 hours) at 07/29/2017 0653 Last data filed at 07/29/2017 5701 Gross per 24 hour  Intake 605 ml  Output 2400 ml  Net -1795 ml   Filed Weights   07/27/17 0641 07/28/17 0620 07/29/17 0616  Weight: 172 lb 1.6 oz (78.1 kg) 172 lb 14.4 oz (78.4 kg) 169 lb 12.1 oz (77 kg)    Telemetry    Sinus rhythm 07/29/2017 - Personally Reviewed  ECG    NSR no acute ST changes   Physical Exam  Appears comfortable GEN: No acute distress.   Neck: No JVD Cardiac: RRR, no murmurs, rubs, or gallops.  Respiratory: Clear to auscultation bilaterally. GI: Soft, nontender, non-distended  MS: No edema; No deformity. Neuro:  Nonfocal  Psych: Normal affect  Right radial A no hematoma   Labs    Chemistry Recent Labs  Lab 07/24/17 2258  07/25/17 0444 07/28/17 0710 07/29/17 0312  NA  --  139 140 139  K  --  3.9 4.1 3.9  CL  --  108 109 113*  CO2  --  21* 25 21*  GLUCOSE  --  87 113* 113*  BUN  --  15 15 14   CREATININE 0.94 0.89 0.85 0.83  CALCIUM  --  9.0 9.1 8.5*  PROT 6.5  --   --   --   ALBUMIN 3.8  --   --   --   AST 23  --   --   --   ALT 23  --   --   --   ALKPHOS 57  --   --   --   BILITOT 0.5  --   --   --   GFRNONAA >60 >60 >60 >60  GFRAA >60 >60 >60 >60  ANIONGAP  --  10 6 5      Hematology Recent Labs  Lab 07/25/17 0444 07/28/17 0710 07/29/17 0312  WBC 5.1 4.9 5.2  RBC 3.87 4.02 3.65*  HGB 11.9* 12.5 11.2*  HCT 35.6* 37.9 34.2*  MCV 92.0 94.3 93.7  MCH 30.7 31.1 30.7  MCHC 33.4 33.0 32.7  RDW 12.2 12.1 12.2  PLT 164 161 146*    Cardiac Enzymes Recent Labs  Lab 07/24/17 2258 07/25/17 0444 07/25/17 1051  TROPONINI <0.03 <0.03 <0.03    Recent Labs  Lab 07/24/17 1703 07/24/17 2011  TROPIPOC 0.00 0.01     BNPNo results for input(s): BNP, PROBNP in the last 168 hours.   DDimer  Recent Labs  Lab 07/24/17 1832  DDIMER 2.93*     Radiology    No results found.  Cardiac Studies  ECHO 07/25/2017  - Left ventricle: The cavity size was normal. Systolic function was normal. The estimated ejection fraction was in the range of 55% to 60%. Wall motion was normal; there were no regional wall motion abnormalities. Left ventricular diastolic function parameters were normal.  Patient Profile     52 y.o. female with unstable angina post stenting mid RCA with residual 70% small D1  Assessment & Plan    CAD post DES to mid RCA DAT for a year labs stable continue statin an beta blocker May shower this am outpatient f/u with Dr Acie Fredrickson .  For questions or updates, please contact [facility] Please consult [email or web address] for contact info under Cardiology/STEMI.      Signed, Jenkins Rouge, MD  07/29/2017, 6:53 AM

## 2017-07-29 NOTE — Discharge Instructions (Signed)

## 2017-07-29 NOTE — Telephone Encounter (Signed)
The pt is being discharged from the hosp today. Will call tomorrow. 

## 2017-07-29 NOTE — Care Management Note (Signed)
Case Management Note  Patient Details  Name: Nancy Sparks MRN: [id number] Date of Birth: Jun 16, 1965  Subjective/Objective:    From home, s/p stent intervention, will be on brilinta, RN Nere gave patient the 30 day free coupon and the 5.00 card.  Patient can call the phone number on the back of the card for any questions.                 Action/Plan: DC home when ready.   Expected Discharge Date:  07/29/17               Expected Discharge Plan:  Home/Self Care  In-House Referral:     Discharge planning Services  CM Consult, Medication Assistance  Post Acute Care Choice:    Choice offered to:     DME Arranged:    DME Agency:     HH Arranged:    HH Agency:     Status of Service:  Completed, signed off  If discussed at [employer] of Stay Meetings, dates discussed:    Additional Comments:  Zenon Mayo, RN 07/29/2017, 9:57 AM

## 2017-07-30 ENCOUNTER — Other Ambulatory Visit: Payer: Self-pay | Admitting: Cardiovascular Disease

## 2017-07-30 ENCOUNTER — Telehealth: Payer: Self-pay | Admitting: *Deleted

## 2017-07-30 MED ORDER — NITROGLYCERIN 0.4 MG SL SUBL: 0.4000 mg | SUBLINGUAL_TABLET | SUBLINGUAL | 3 refills | Status: DC | PRN

## 2017-07-30 NOTE — Telephone Encounter (Signed)
°*  STAT* If patient is at the pharmacy, call can be transferred to refill team.   1. Which medications need to be refilled? (please list name of each medication and dose if known) pt discharged from the hospital-said she saw Dr C- needs prescriptions for her Nitrtoglycerin and Xanax  2. Which pharmacy/location (including street and city if local pharmacy) is medication to be sent to?[facility] [place]  3. Do they need a 30 day or 90 day supply? 1 bottle and 30

## 2017-07-30 NOTE — Telephone Encounter (Signed)
Patient had a stent placed on 07/28/17 ( notes in epic) the doctor recommended she stop taking HRT, she asked how does she wean off patch and progesterone? Please advise

## 2017-07-30 NOTE — Telephone Encounter (Addendum)
**Note De-Identified Crissie Aloi Obfuscation** Patient contacted regarding discharge from [facility] on 07/29/17.  Patient understands to follow up with provider Richardson Dopp, PA-c on 08/12/17 at 8:45 at [facility] in [place]. Patient understands discharge instructions? Yes Patient understands medications and regiment? Yes Patient understands to bring all medications to this visit? Yes  The pt c/o a headache since her hosp d/c yesterday and wants to know what she should take for it and she states that she was suppose to have NTG and Alprazolam but that it was not called into her pharmacy. I advised her that I would discuss with Dr Rayann Heman (DOD) and call her back with his advise.  Per Dr Rayann Heman I advised the pt that she should continue to take Tylenol as directed and that if there is no improvement with her head ache to contact her PCP and that it is ok for her to have NTG. I also advised her that she will need to contact her PCP as well for Alprazolam.  She verbalized understanding and thanked me for calling to check on her.  Per her request I have sent her NTG to [facility] [address] to fill.

## 2017-07-30 NOTE — Telephone Encounter (Signed)
TC to pt. Will try to stop HRT ASAP

## 2017-07-31 MED ORDER — NITROGLYCERIN 0.4 MG SL SUBL: 0.4000 mg | SUBLINGUAL_TABLET | SUBLINGUAL | 3 refills | Status: DC | PRN

## 2017-07-31 MED ORDER — ALPRAZOLAM 0.25 MG PO TABS: 0.2500 mg | ORAL_TABLET | Freq: Every evening | ORAL | 3 refills | Status: DC | PRN

## 2017-08-01 ENCOUNTER — Telehealth (HOSPITAL_COMMUNITY): Payer: Self-pay

## 2017-08-01 NOTE — Telephone Encounter (Signed)
Pt insurance is active and benefits verified through [employer]. Co-pay $0.00, DED $3,500.00/$1,137.72 met, out of pocket $3,000.00/$3,000.00 met, co-insurance 30%. No pre-authorization required. [employer], 08/01/17 @ 8:44AM, ref#[id number]  Will contact patient to see if she is interested in the Cardiac Rehab Program. If interested, patient will need to complete follow up appt. Once completed, patient will be contacted for scheduling upon review by the RN Navigator.

## 2017-08-12 ENCOUNTER — Encounter: Payer: Self-pay | Admitting: Physician Assistant

## 2017-08-12 ENCOUNTER — Ambulatory Visit: Payer: BLUE CROSS/BLUE SHIELD | Admitting: Physician Assistant

## 2017-08-12 VITALS — BP 158/88 | HR 74 | Ht 66.0 in | Wt 175.0 lb

## 2017-08-12 DIAGNOSIS — I25119 Atherosclerotic heart disease of native coronary artery with unspecified angina pectoris: Secondary | ICD-10-CM | POA: Diagnosis not present

## 2017-08-12 DIAGNOSIS — E7801 Familial hypercholesterolemia: Secondary | ICD-10-CM

## 2017-08-12 DIAGNOSIS — F418 Other specified anxiety disorders: Secondary | ICD-10-CM | POA: Diagnosis not present

## 2017-08-12 DIAGNOSIS — F419 Anxiety disorder, unspecified: Secondary | ICD-10-CM | POA: Insufficient documentation

## 2017-08-12 DIAGNOSIS — R7301 Impaired fasting glucose: Secondary | ICD-10-CM | POA: Diagnosis not present

## 2017-08-12 DIAGNOSIS — I2510 Atherosclerotic heart disease of native coronary artery without angina pectoris: Secondary | ICD-10-CM | POA: Diagnosis not present

## 2017-08-12 DIAGNOSIS — I10 Essential (primary) hypertension: Secondary | ICD-10-CM

## 2017-08-12 MED ORDER — AMLODIPINE BESYLATE 2.5 MG PO TABS: 2.5000 mg | ORAL_TABLET | Freq: Every day | ORAL | 3 refills | Status: DC

## 2017-08-12 NOTE — Patient Instructions (Signed)
Medication Instructions:  1. START AMLODIPINE 2.5 MG TAKE 1 TABLET DAILY; RX HAS BEEN SENT IN  Labwork: NONE ORDERED TODAY  Testing/Procedures: NONE ORDERED TODAY  Follow-Up: 1. A REFERRAL FOR CARDIAC REHAB HAS BEEN PLACED ; THEY WILL CALL YOU WITH AN APPT  2. A REFERRAL TO LIPID CLINIC HAS BEEN ORDERED; PER PA YOU WILL NEED APPT IN 2 WEEKS   3. 2 WEEK FOLLOW UP WITH DR. Sallyanne Kuster OR APP SAME DAY DR. Sallyanne Kuster IS IN THE OFFICE AT OUR [place] OFFICE; IF NOTHING AVAILABLE YOU CAN SEE SCOTT WEAVER, PAC IN OUR OFFICE IN 2 WEEKS  Any Other Special Instructions Will Be Listed Below (If Applicable).     If you need a refill on your cardiac medications before your next appointment, please call your pharmacy.

## 2017-08-12 NOTE — Progress Notes (Signed)
Cardiology Office Note:    Date:  08/12/2017   ID:  Nancy Sparks, DOB 06-13-1965, MRN [id number]  PCP:  Haywood Pao, MD  Cardiologist:  Sanda Klein, MD   Referring MD: Haywood Pao, MD   Chief Complaint  Patient presents with  . Hospitalization Follow-up    Unstable angina, PCI    History of Present Illness:    Nancy Sparks is a 52 y.o. female with hyperlipidemia.  She was admitted 6/20-6/25 with unstable angina.  Cardiac enzymes remained normal.  Echocardiogram demonstrated normal LV function.  Cardiac catheterization demonstrated two-vessel CAD with 75% ostial D1 stenosis and 90% mid RCA stenosis.  RCA was treated with PCI with drug-eluting stent.  Medical therapy was recommended for the ostial diagonal stenosis.  Isosorbide medical regimen.  Of note, her LDL was 173 on moderate intensity statin therapy.  It was felt that she should be considered for PCSK9 inhibitor therapy in addition to high-dose statin therapy.  Nancy Sparks returns for post hospital ablation follow-up.  She is here today with her husband.  Since discharge, she notes continued symptoms of chest and throat discomfort.  This is much improved since her PCI.  However, she continues to note occasional symptoms.  She sometimes has it with exertion.  She does note random episodes of shortness of breath.  She denies any significant dyspnea with exertion, orthopnea, PND or edema.  She denies syncope but has been dizzy at times.  She has had some difficulty tolerating her new medications.  Of note, she is being tapered off of estradiol.  Prior CV studies:   The following studies were reviewed today:  Cardiac catheterization 07/28/2017 LAD proximal 20; D1 ostial 75 RCA proximal 20, mid 90 PCI: 3 x 18 mm resolute Onyx DES to the RCA  Echocardiogram 07/25/2017 EF 55-60, normal wall motion, normal diastolic function  Past Medical History:  Diagnosis Date  . Anemia    "w/pregnancies  . Anxiety    hx  .  CAD (coronary artery disease)    LHC 6/19: pLAD, oD1 75, pRCA 20, mRCA 90 >> PCI: DES to RCA // Echo 6/19: EF 55-60, normal wall motion  . Complication of anesthesia    itching relieved with benadryl w/all my surgeries (07/24/2017)  . Depression    hx  . Dyslipidemia   . Fibroid   . GERD (gastroesophageal reflux disease)   . Heart murmur    hx  . Heart palpitations    hx of  . Hx of adenomatous polyp of colon 12/24/2002  . Hypertension    Surgical Hx: The patient  has a past surgical history that includes Cesarean section (1996; 1998; 2001); Novasure ablation (2006); Colonoscopy; Lumbar laminectomy/decompression microdiscectomy (04/25/2011); Lumbar laminectomy/decompression microdiscectomy (04/26/2011); Back surgery; Tubal ligation (2001); LEFT HEART CATH AND CORONARY ANGIOGRAPHY (N/A, 07/28/2017); and CORONARY STENT INTERVENTION (N/A, 07/28/2017).   Current Medications: Current Meds  Medication Sig  . ALPRAZolam (XANAX) 0.25 MG tablet Take 1 tablet (0.25 mg total) by mouth at bedtime as needed for anxiety.  Marland Kitchen aspirin EC 81 MG EC tablet Take 1 tablet (81 mg total) by mouth daily.  Marland Kitchen estradiol (VIVELLE-DOT) 0.05 MG/24HR patch Place 1 patch (0.05 mg total) onto the skin 2 (two) times a week.  Marland Kitchen ibuprofen (ADVIL,MOTRIN) 200 MG tablet Take 600-800 mg by mouth every 6 (six) hours as needed (pain).   . isosorbide mononitrate (IMDUR) 30 MG 24 hr tablet Take 1 tablet (30 mg total) by mouth daily.  Marland Kitchen  metoprolol tartrate (LOPRESSOR) 25 MG tablet Take 1 tablet (25 mg total) by mouth 2 (two) times daily.  . nitroGLYCERIN (NITROSTAT) 0.4 MG SL tablet Place 1 tablet (0.4 mg total) under the tongue every 5 (five) minutes as needed for chest pain.  Marland Kitchen PROGESTERONE MICRONIZED PO Take by mouth as directed.  . rosuvastatin (CRESTOR) 40 MG tablet Take 1 tablet (40 mg total) by mouth at bedtime.  . Tetrahydrozoline HCl (VISINE OP) Place 1 drop into both eyes daily as needed (dry eyes).  . ticagrelor (BRILINTA)  90 MG TABS tablet Take 1 tablet (90 mg total) by mouth 2 (two) times daily.  . trazodone (DESYREL) 300 MG tablet Take 300 mg by mouth as directed.     Allergies:   Latex and Tape   Social History   Tobacco Use  . Smoking status: Former Smoker    Packs/day: 0.10    Years: 2.00    Pack years: 0.20    Types: Cigarettes  . Smokeless tobacco: Never Used  . Tobacco comment: quit smoking in college  Substance Use Topics  . Alcohol use: Yes    Alcohol/week: 3.6 oz    Types: 6 Glasses of wine per week  . Drug use: Not Currently    Types: Marijuana    Comment: 07/24/2017 "in college"     Family Hx: The patient's family history includes Anesthesia problems in her mother and sister; Atrial fibrillation in her father; Cancer in her sister; Colon cancer (age of onset: 66) in her sister; Coronary artery disease in her father; Diabetes in her father; Heart disease in her father; Hyperlipidemia in her brother, father, mother, and sister; Hypertension in her father and mother; Thyroid disease in her sister.  ROS:   Please see the history of present illness.    Review of Systems  Cardiovascular: Positive for chest pain.  Respiratory: Positive for shortness of breath.    All other systems reviewed and are negative.   EKGs/Labs/Other Test Reviewed:    EKG:  EKG is  ordered today.  The ekg ordered today demonstrates normal sinus rhythm, heart rate 70, normal axis, early repolarization, QTC 438, no significant change when compared to multiple prior tracings  Recent Labs: 03/18/2017: TSH 1.97 07/24/2017: ALT 23 07/29/2017: BUN 14; Creatinine, Ser 0.83; Hemoglobin 11.2; Platelets 146; Potassium 3.9; Sodium 139   Recent Lipid Panel Lab Results  Component Value Date/Time   CHOL 263 (H) 07/25/2017 10:51 AM   TRIG 58 07/25/2017 10:51 AM   HDL 78 07/25/2017 10:51 AM   CHOLHDL 3.4 07/25/2017 10:51 AM   LDLCALC 173 (H) 07/25/2017 10:51 AM   LDLDIRECT 155.5 05/14/2011 09:24 AM    Physical Exam:      VS:  BP (!) 158/88   Pulse 74   Ht 5\' 6"  (1.676 m)   Wt 175 lb (79.4 kg)   SpO2 97%   BMI 28.25 kg/m     Wt Readings from Last 3 Encounters:  08/12/17 175 lb (79.4 kg)  07/29/17 169 lb 12.1 oz (77 kg)  03/18/17 174 lb (78.9 kg)     Physical Exam  Constitutional: She is oriented to person, place, and time. She appears well-developed and well-nourished. No distress.  HENT:  Head: Normocephalic and atraumatic.  Neck: Neck supple. No JVD present.  Cardiovascular: Normal rate, regular rhythm, S1 normal, S2 normal and normal heart sounds.  No murmur heard. Pulmonary/Chest: Effort normal. She has no rales.  Abdominal: Soft. There is no hepatomegaly.  Musculoskeletal: She exhibits  no edema.  Right wrist without hematoma  Neurological: She is alert and oriented to person, place, and time.  Skin: Skin is warm and dry.    ASSESSMENT & PLAN:    Coronary artery disease involving native coronary artery of native heart with angina pectoris Status post recent admission with unstable angina treated with drug-eluting stent to the RCA.  She does have residual disease with 75% ostial diagonal stenosis.  Additional medical management was recommended.  Her symptoms of angina are much improved.  However, she continues to have episodes of chest discomfort.  She has not tried nitroglycerin.  She is currently managed with metoprolol tartrate and isosorbide.  She did have a significant headache with the isosorbide and this is gradually improving.  Her blood pressure remains above target.  I have suggested adding amlodipine for blood pressure control and as an antianginal.  If she continues to have symptoms despite maximal medical therapy for angina, question if PCI of the diagonal could be considered.  I will review this further with Dr. Sallyanne Kuster.  She is also having some shortness of breath related to Brilinta.  I reassured her that this will continue to improve.  However, if she continues to have  significant symptoms, we may need to consider changing her to Clopidogrel.  She is interested in cardiac rehabilitation.  -Start amlodipine 2.5 mg daily  -Continue aspirin, Brilinta, high-dose statin  -Continue metoprolol, isosorbide  -Close follow-up in 2 weeks to assess antianginal therapy  -Refer to cardiac rehabilitation  Essential hypertension Blood pressure above target.  Add amlodipine as noted.  Familial hypercholesterolemia Continue high-dose statin.  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  I will refer her to the lipid clinic for consideration of PCSK-9 inhibitor therapy.   Dispo:  Return in about 2 weeks (around 08/26/2017) for Close Follow Up w/ Dr. Sallyanne Kuster.   Medication Adjustments/Labs and Tests Ordered: Current medicines are reviewed at length with the patient today.  Concerns regarding medicines are outlined above.  Tests Ordered: Orders Placed This Encounter  Procedures  . AMB Referral to [facility]  . AMB referral to cardiac rehabilitation  . EKG 12-Lead   Medication Changes: Meds ordered this encounter  Medications  . amLODipine (NORVASC) 2.5 MG tablet    Sig: Take 1 tablet (2.5 mg total) by mouth daily.    Dispense:  90 tablet    Refill:  3    Signed, Richardson Dopp, PA-C  08/12/2017 9:32 PM    [facility] [address], [place], [place]  [address] Phone: [phone]; Fax: [phone]

## 2017-08-13 NOTE — Progress Notes (Signed)
Peter's call, but I remember the diagonal as being fairly small. Nancy Sparks

## 2017-08-15 ENCOUNTER — Telehealth (HOSPITAL_COMMUNITY): Payer: Self-pay

## 2017-08-15 NOTE — Telephone Encounter (Signed)
Called patient to see if she is interested in the Cardiac rehab program - patient stated she is but she is currently at lunch and will call back in an hour. If no phone call, will follow up.

## 2017-08-21 ENCOUNTER — Telehealth (HOSPITAL_COMMUNITY): Payer: Self-pay

## 2017-08-21 NOTE — Telephone Encounter (Signed)
Patient called and will be coming in for orientation on 09/25/17 @ 8:15AM and will attend 9:45AM ex. Class.

## 2017-08-21 NOTE — Telephone Encounter (Signed)
Patient returned phone call and left voicemail. Returned patients called - lm on vm

## 2017-09-03 ENCOUNTER — Encounter: Payer: Self-pay | Admitting: Physician Assistant

## 2017-09-03 ENCOUNTER — Ambulatory Visit: Payer: BLUE CROSS/BLUE SHIELD | Admitting: Physician Assistant

## 2017-09-03 ENCOUNTER — Ambulatory Visit (INDEPENDENT_AMBULATORY_CARE_PROVIDER_SITE_OTHER): Payer: BLUE CROSS/BLUE SHIELD | Admitting: Pharmacist

## 2017-09-03 VITALS — BP 158/90 | HR 101 | Ht 66.0 in | Wt 175.0 lb

## 2017-09-03 DIAGNOSIS — I25119 Atherosclerotic heart disease of native coronary artery with unspecified angina pectoris: Secondary | ICD-10-CM

## 2017-09-03 DIAGNOSIS — I10 Essential (primary) hypertension: Secondary | ICD-10-CM | POA: Diagnosis not present

## 2017-09-03 DIAGNOSIS — I2510 Atherosclerotic heart disease of native coronary artery without angina pectoris: Secondary | ICD-10-CM

## 2017-09-03 DIAGNOSIS — E782 Mixed hyperlipidemia: Secondary | ICD-10-CM | POA: Diagnosis not present

## 2017-09-03 DIAGNOSIS — E785 Hyperlipidemia, unspecified: Secondary | ICD-10-CM | POA: Diagnosis not present

## 2017-09-03 DIAGNOSIS — E7801 Familial hypercholesterolemia: Secondary | ICD-10-CM

## 2017-09-03 DIAGNOSIS — F419 Anxiety disorder, unspecified: Secondary | ICD-10-CM

## 2017-09-03 LAB — CBC
Hematocrit: 38.7 % (ref 34.0–46.6)
Hemoglobin: 13.1 g/dL (ref 11.1–15.9)
MCH: 30.5 pg (ref 26.6–33.0)
MCHC: 33.9 g/dL (ref 31.5–35.7)
MCV: 90 fL (ref 79–97)
PLATELETS: 178 10*3/uL (ref 150–450)
RBC: 4.3 x10E6/uL (ref 3.77–5.28)
RDW: 14.1 % (ref 12.3–15.4)
WBC: 8.8 10*3/uL (ref 3.4–10.8)

## 2017-09-03 LAB — BASIC METABOLIC PANEL
BUN / CREAT RATIO: 25 — AB (ref 9–23)
BUN: 20 mg/dL (ref 6–24)
CO2: 26 mmol/L (ref 20–29)
CREATININE: 0.79 mg/dL (ref 0.57–1.00)
Calcium: 10 mg/dL (ref 8.7–10.2)
Chloride: 105 mmol/L (ref 96–106)
GFR calc Af Amer: 100 mL/min/{1.73_m2} (ref >59)
GFR calc non Af Amer: 86 mL/min/{1.73_m2} (ref >59)
GLUCOSE: 102 mg/dL — AB (ref 65–99)
Potassium: 4 mmol/L (ref 3.5–5.2)
SODIUM: 141 mmol/L (ref 134–144)

## 2017-09-03 LAB — LDL CHOLESTEROL, DIRECT: LDL DIRECT: 115 mg/dL — AB (ref 0–99)

## 2017-09-03 MED ORDER — CLOPIDOGREL BISULFATE 75 MG PO TABS: 75.0000 mg | ORAL_TABLET | Freq: Every day | ORAL | 3 refills | Status: DC

## 2017-09-03 MED ORDER — METOPROLOL TARTRATE 50 MG PO TABS: 50.0000 mg | ORAL_TABLET | Freq: Two times a day (BID) | ORAL | 3 refills | Status: DC

## 2017-09-03 MED ORDER — ALPRAZOLAM 0.25 MG PO TABS: 0.2500 mg | ORAL_TABLET | Freq: Every day | ORAL | 0 refills | Status: DC | PRN

## 2017-09-03 NOTE — Patient Instructions (Addendum)
It was nice to meet you today  We will check your LDL cholesterol today - your LDL goal is less than 70  We will start paperwork for Praluent injections. Depending on how your cholesterol comes back today, we may try a pill called Zetia (ezetimibe) instead

## 2017-09-03 NOTE — H&P (View-Only) (Signed)
Cardiology Office Note:    Date:  09/03/2017   ID:  Nancy Sparks, DOB 21-Jul-1965, MRN [id number]  PCP:  Nancy Pao, MD  Cardiologist:  Nancy Klein, MD    Referring MD: Nancy Pao, MD   Chief Complaint  Patient presents with  . Follow-up    angina    History of Present Illness:    Nancy Sparks is a 52 y.o. female with coronary artery disease, hyperlipidemia, family history of coronary disease.  She was admitted in June 2019 with unstable angina.  Cardiac catheterization demonstrated 75% stenosis in ostial first diagonal and 90% stenosis in the mid RCA.  The RCA was treated with a drug-eluting stent.  Medical therapy was recommended for the diagonal stenosis.  She was seen in follow up on 08/12/17.  She noted significant improvement in her anginal symptoms.  However, she continued to have some chest symptoms.  She noted significant headache with long-acting nitrate therapy.  Therefore, instead of increasing her nitrate dose, I started her on amlodipine.  I did review her case with Nancy Sparks, who did her heart catheterization.  He felt that continued medical management is warranted.  However, if she has refractory angina, Cutting Balloon angioplasty of the diagonal could be considered.  Of note, she did see Nancy Sparks, Nancy Sparks with the Nancy Sparks today.   Nancy Sparks returns for follow up.  She is here with her husband.  She is tearful as I walk in the room.  She notes worsening chest symptoms since last seen.  She has substernal squeezing discomfort which radiates up into her jaw and between her scapulae. She seems to get this at rest.  She has not taken NTG.  Her pain is similar to her angina but not as intense as what she had with her admission to the hospital.  She also notes dyspnea on exertion with mild activities.  She has had to shorten her walks.  She also notes shortness of breath that unusual times, which sounds consistent with side effects to Ticagrelor.  She also  has some numbness and tingling in her bilateral arms down to her elbows.  She is been experiencing palpitations as well.  She denies syncope.  She has been dizzy.  She is quite anxious with all of her symptoms and is significantly concerned.     Prior CV studies:   The following studies were reviewed today:  Cardiac catheterization 07/28/2017 LAD proximal 20; D1 ostial 75 RCA proximal 20, mid 90 PCI: 3 x 18 mm resolute Onyx DES to the RCA  Echocardiogram 07/25/2017 EF 55-60, normal wall motion, normal diastolic function  Past Medical History:  Diagnosis Date  . Anemia    "w/pregnancies  . Anxiety    hx  . CAD (coronary artery disease)    LHC 6/19: pLAD, oD1 75, pRCA 20, mRCA 90 >> PCI: DES to RCA // Echo 6/19: EF 55-60, normal wall motion  . Complication of anesthesia    itching relieved with benadryl w/all my surgeries (07/24/2017)  . Depression    hx  . Dyslipidemia   . Fibroid   . GERD (gastroesophageal reflux disease)   . Heart murmur    hx  . Heart palpitations    hx of  . Hx of adenomatous polyp of colon 12/24/2002  . Hypertension    Surgical Hx: The patient  has a past surgical history that includes Cesarean section (1996; 1998; 2001); Novasure ablation (2006); Colonoscopy; Lumbar laminectomy/decompression microdiscectomy (04/25/2011);  Lumbar laminectomy/decompression microdiscectomy (04/26/2011); Back surgery; Tubal ligation (2001); LEFT HEART CATH AND CORONARY ANGIOGRAPHY (N/A, 07/28/2017); and CORONARY STENT INTERVENTION (N/A, 07/28/2017).   Current Medications: Current Meds  Medication Sig  . ALPRAZolam (XANAX) 0.25 MG tablet Take 1 tablet (0.25 mg total) by mouth daily as needed for anxiety.  Marland Kitchen amLODipine (NORVASC) 2.5 MG tablet Take 1 tablet (2.5 mg total) by mouth daily.  Marland Kitchen aspirin EC 81 MG EC tablet Take 1 tablet (81 mg total) by mouth daily.  Marland Kitchen ibuprofen (ADVIL,MOTRIN) 200 MG tablet Take 600-800 mg by mouth every 6 (six) hours as needed (pain).   . isosorbide  mononitrate (IMDUR) 30 MG 24 hr tablet Take 1 tablet (30 mg total) by mouth daily.  . nitroGLYCERIN (NITROSTAT) 0.4 MG SL tablet Place 1 tablet (0.4 mg total) under the tongue every 5 (five) minutes as needed for chest pain.  . rosuvastatin (CRESTOR) 40 MG tablet Take 1 tablet (40 mg total) by mouth at bedtime.  . Tetrahydrozoline HCl (VISINE OP) Place 1 drop into both eyes daily as needed (dry eyes).  . trazodone (DESYREL) 300 MG tablet Take 300 mg by mouth as directed.  . [DISCONTINUED] ALPRAZolam (XANAX) 0.25 MG tablet Take 1 tablet (0.25 mg total) by mouth at bedtime as needed for anxiety.  . [DISCONTINUED] metoprolol tartrate (LOPRESSOR) 25 MG tablet Take 1 tablet (25 mg total) by mouth 2 (two) times daily.  . [DISCONTINUED] ticagrelor (BRILINTA) 90 MG TABS tablet Take 1 tablet (90 mg total) by mouth 2 (two) times daily.     Allergies:   Latex and Tape   Social History   Tobacco Use  . Smoking status: Former Smoker    Packs/day: 0.10    Years: 2.00    Pack years: 0.20    Types: Cigarettes  . Smokeless tobacco: Never Used  . Tobacco comment: quit smoking in college  Substance Use Topics  . Alcohol use: Yes    Alcohol/week: 3.6 oz    Types: 6 Glasses of wine per week  . Drug use: Not Currently    Types: Marijuana    Comment: 07/24/2017 "in college"     Family Hx: The patient's family history includes Anesthesia problems in her mother and sister; Atrial fibrillation in her father; Cancer in her sister; Colon cancer (age of onset: 71) in her sister; Coronary artery disease in her father; Diabetes in her father; Heart disease in her father; Hyperlipidemia in her brother, father, mother, and sister; Hypertension in her father and mother; Thyroid disease in her sister.  ROS:   Please see the history of present illness.    Review of Systems  Cardiovascular: Positive for chest pain and irregular heartbeat.  Respiratory: Positive for shortness of breath.    All other systems reviewed  and are negative.   EKGs/Labs/Other Test Reviewed:    EKG:  EKG is  ordered today.  The ekg ordered today demonstrates sinus tachycardia, heart rate 101, normal axis, QTC 471, no acute ST-T wave changes, frequent PVCs  Recent Labs: 03/18/2017: TSH 1.97 07/24/2017: ALT 23 07/29/2017: BUN 14; Creatinine, Ser 0.83; Hemoglobin 11.2; Platelets 146; Potassium 3.9; Sodium 139   Recent Lipid Panel Lab Results  Component Value Date/Time   CHOL 263 (H) 07/25/2017 10:51 AM   TRIG 58 07/25/2017 10:51 AM   HDL 78 07/25/2017 10:51 AM   CHOLHDL 3.4 07/25/2017 10:51 AM   LDLCALC 173 (H) 07/25/2017 10:51 AM   LDLDIRECT 115 (H) 09/03/2017 12:09 PM   LDLDIRECT 155.5 05/14/2011  09:24 AM    Physical Exam:    VS:  BP (!) 158/90   Pulse (!) 101   Ht 5\' 6"  (1.676 m)   Wt 175 lb (79.4 kg)   SpO2 98%   BMI 28.25 kg/m     Wt Readings from Last 3 Encounters:  09/03/17 175 lb (79.4 kg)  08/12/17 175 lb (79.4 kg)  07/29/17 169 lb 12.1 oz (77 kg)     Physical Exam  Constitutional: She is oriented to person, place, and time. She appears well-developed and well-nourished. No distress.  HENT:  Head: Normocephalic and atraumatic.  Eyes: No scleral icterus.  Neck: Neck supple. No JVD present.  Cardiovascular: Normal rate, S1 normal and S2 normal. A regularly irregular rhythm present.  No murmur heard. Pulmonary/Chest: Effort normal. She has no wheezes. She has no rales.  Abdominal: Soft. There is no hepatomegaly.  Musculoskeletal: She exhibits no edema.  Neurological: She is alert and oriented to person, place, and time.  Skin: Skin is warm and dry.  Psychiatric: She has a normal mood and affect.    ASSESSMENT & PLAN:    Coronary artery disease involving native coronary artery of native heart with angina pectoris [facility]) She is status post PCI with drug-eluting stent to the RCA in June 2019 in the setting of unstable angina.  She has residual disease in the small, ostial diagonal.  Medical therapy  has been recommended.  When last seen, I adjusted her antianginal therapy.  However, she continues to have anginal symptoms.  They have somewhat escalated since she was last seen.  She is also having symptoms that sound somewhat consistent with side effects to Ticagrelor.  I discussed her case today with Nancy Sparks.  He felt that if she had refractory angina, consideration could be given toward approaching her diagonal with cutting balloon angioplasty.  Her symptoms are creating more more anxiety for her.  She is eager to get into cardiac rehab.  Unfortunately, their schedule is booked out through the end of August.  I reviewed her case today with Dr. Johnsie Cancel (attending MD).  As she did not have ACS, he agreed with changing her Ticagrelor to Clopidogrel.  He also agreed with proceeding with cardiac catheterization.  Risks and benefits of cardiac catheterization have been discussed with the patient.  These include bleeding, infection, kidney damage, stroke, heart attack, death.  The patient understands these risks and is willing to proceed.    -Continue aspirin, statin, nitrates, amlodipine  -DC Ticagrelor  -Start Clopidogrel 75 mg daily  -Increase metoprolol tartrate to 50 mg twice daily  -Arrange cardiac catheterization tomorrow (she requests Dr. Burt Knack)  -She had minimal response to high dose sedation during last cath   Essential hypertension Blood pressure remains uncontrolled.  Adjust metoprolol as noted.  Familial hypercholesterolemia Continue high-dose statin.  She has been evaluated by the lipid Sparks.  Repeat LDL is pending.  She may be a candidate for ezetimibe versus PCSK9 inhibitor.  Anxiety She is having significant anxiety.  She was given anxiolytics in the past.  I have agreed to give her one prescription for Xanax 0.25 mg daily as needed.  She will be given #5, no refills.  She should have future refills with her primary care provider.  PVCs Question if related to stress.  She had  normal ejection fraction by echocardiogram in the past.  Obtain BMET today.  Adjust beta-blocker as noted.   Dispo:  Return in about 2 weeks (around 09/17/2017) for Post  Procedure Follow Up w/ Dr. Sallyanne Kuster, or Richardson Dopp, PA-C.   Medication Adjustments/Labs and Tests Ordered: Current medicines are reviewed at length with the patient today.  Concerns regarding medicines are outlined above.  Tests Ordered: Orders Placed This Encounter  Procedures  . Basic Metabolic Panel (BMET)  . CBC  . EKG 12-Lead   Medication Changes: Meds ordered this encounter  Medications  . metoprolol tartrate (LOPRESSOR) 50 MG tablet    Sig: Take 1 tablet (50 mg total) by mouth 2 (two) times daily.    Dispense:  180 tablet    Refill:  3  . clopidogrel (PLAVIX) 75 MG tablet    Sig: Take 1 tablet (75 mg total) by mouth daily.    Dispense:  90 tablet    Refill:  3  . ALPRAZolam (XANAX) 0.25 MG tablet    Sig: Take 1 tablet (0.25 mg total) by mouth daily as needed for anxiety.    Dispense:  5 tablet    Refill:  0    THIS IS A ONE TIME REFILL; FURTHER REFILLS TO BE DONE WITH PRIMARY CARE    Signed, Richardson Dopp, PA-C  09/03/2017 5:19 PM    [facility] [address], [place], [place]  [address] Phone: [phone]; Fax: [phone]   Patient examined chart reviewed Discussed care with PA Some of her pain may be non cardiac given her overall anxiety levels. Review of her cath films shows a small diagonal branch with an ostial stenosis that would have to Be Rx with cutting balloon Too small for stenting and may jeopardize flow down larger LAD Continue current meds Change to plavix increase beta blocker and plan for cath tomorrow  Jenkins Rouge

## 2017-09-03 NOTE — Progress Notes (Addendum)
Cardiology Office Note:    Date:  09/03/2017   ID:  Nancy Sparks, DOB 05/04/1965, MRN [id number]  PCP:  Haywood Pao, MD  Cardiologist:  Sanda Klein, MD    Referring MD: Haywood Pao, MD   Chief Complaint  Patient presents with  . Follow-up    angina    History of Present Illness:    Nancy Sparks is a 52 y.o. female with coronary artery disease, hyperlipidemia, family history of coronary disease.  She was admitted in June 2019 with unstable angina.  Cardiac catheterization demonstrated 75% stenosis in ostial first diagonal and 90% stenosis in the mid RCA.  The RCA was treated with a drug-eluting stent.  Medical therapy was recommended for the diagonal stenosis.  She was seen in follow up on 08/12/17.  She noted significant improvement in her anginal symptoms.  However, she continued to have some chest symptoms.  She noted significant headache with long-acting nitrate therapy.  Therefore, instead of increasing her nitrate dose, I started her on amlodipine.  I did review her case with Dr. Claiborne Billings, who did her heart catheterization.  He felt that continued medical management is warranted.  However, if she has refractory angina, Cutting Balloon angioplasty of the diagonal could be considered.  Of note, she did see Fuller Canada, PharmD with the [facility] today.   [name] returns for follow up.  She is here with her husband.  She is tearful as I walk in the room.  She notes worsening chest symptoms since last seen.  She has substernal squeezing discomfort which radiates up into her jaw and between her scapulae. She seems to get this at rest.  She has not taken NTG.  Her pain is similar to her angina but not as intense as what she had with her admission to the hospital.  She also notes dyspnea on exertion with mild activities.  She has had to shorten her walks.  She also notes shortness of breath that unusual times, which sounds consistent with side effects to Ticagrelor.  She also  has some numbness and tingling in her bilateral arms down to her elbows.  She is been experiencing palpitations as well.  She denies syncope.  She has been dizzy.  She is quite anxious with all of her symptoms and is significantly concerned.     Prior CV studies:   The following studies were reviewed today:  Cardiac catheterization 07/28/2017 LAD proximal 20; D1 ostial 75 RCA proximal 20, mid 90 PCI: 3 x 18 mm resolute Onyx DES to the RCA  Echocardiogram 07/25/2017 EF 55-60, normal wall motion, normal diastolic function  Past Medical History:  Diagnosis Date  . Anemia    "w/pregnancies  . Anxiety    hx  . CAD (coronary artery disease)    LHC 6/19: pLAD, oD1 75, pRCA 20, mRCA 90 >> PCI: DES to RCA // Echo 6/19: EF 55-60, normal wall motion  . Complication of anesthesia    itching relieved with benadryl w/all my surgeries (07/24/2017)  . Depression    hx  . Dyslipidemia   . Fibroid   . GERD (gastroesophageal reflux disease)   . Heart murmur    hx  . Heart palpitations    hx of  . Hx of adenomatous polyp of colon 12/24/2002  . Hypertension    Surgical Hx: The patient  has a past surgical history that includes Cesarean section (1996; 1998; 2001); Novasure ablation (2006); Colonoscopy; Lumbar laminectomy/decompression microdiscectomy (04/25/2011);  Lumbar laminectomy/decompression microdiscectomy (04/26/2011); Back surgery; Tubal ligation (2001); LEFT HEART CATH AND CORONARY ANGIOGRAPHY (N/A, 07/28/2017); and CORONARY STENT INTERVENTION (N/A, 07/28/2017).   Current Medications: Current Meds  Medication Sig  . ALPRAZolam (XANAX) 0.25 MG tablet Take 1 tablet (0.25 mg total) by mouth daily as needed for anxiety.  Marland Kitchen amLODipine (NORVASC) 2.5 MG tablet Take 1 tablet (2.5 mg total) by mouth daily.  Marland Kitchen aspirin EC 81 MG EC tablet Take 1 tablet (81 mg total) by mouth daily.  Marland Kitchen ibuprofen (ADVIL,MOTRIN) 200 MG tablet Take 600-800 mg by mouth every 6 (six) hours as needed (pain).   . isosorbide  mononitrate (IMDUR) 30 MG 24 hr tablet Take 1 tablet (30 mg total) by mouth daily.  . nitroGLYCERIN (NITROSTAT) 0.4 MG SL tablet Place 1 tablet (0.4 mg total) under the tongue every 5 (five) minutes as needed for chest pain.  . rosuvastatin (CRESTOR) 40 MG tablet Take 1 tablet (40 mg total) by mouth at bedtime.  . Tetrahydrozoline HCl (VISINE OP) Place 1 drop into both eyes daily as needed (dry eyes).  . trazodone (DESYREL) 300 MG tablet Take 300 mg by mouth as directed.  . [DISCONTINUED] ALPRAZolam (XANAX) 0.25 MG tablet Take 1 tablet (0.25 mg total) by mouth at bedtime as needed for anxiety.  . [DISCONTINUED] metoprolol tartrate (LOPRESSOR) 25 MG tablet Take 1 tablet (25 mg total) by mouth 2 (two) times daily.  . [DISCONTINUED] ticagrelor (BRILINTA) 90 MG TABS tablet Take 1 tablet (90 mg total) by mouth 2 (two) times daily.     Allergies:   Latex and Tape   Social History   Tobacco Use  . Smoking status: Former Smoker    Packs/day: 0.10    Years: 2.00    Pack years: 0.20    Types: Cigarettes  . Smokeless tobacco: Never Used  . Tobacco comment: quit smoking in college  Substance Use Topics  . Alcohol use: Yes    Alcohol/week: 3.6 oz    Types: 6 Glasses of wine per week  . Drug use: Not Currently    Types: Marijuana    Comment: 07/24/2017 "in college"     Family Hx: The patient's family history includes Anesthesia problems in her mother and sister; Atrial fibrillation in her father; Cancer in her sister; Colon cancer (age of onset: 36) in her sister; Coronary artery disease in her father; Diabetes in her father; Heart disease in her father; Hyperlipidemia in her brother, father, mother, and sister; Hypertension in her father and mother; Thyroid disease in her sister.  ROS:   Please see the history of present illness.    Review of Systems  Cardiovascular: Positive for chest pain and irregular heartbeat.  Respiratory: Positive for shortness of breath.    All other systems reviewed  and are negative.   EKGs/Labs/Other Test Reviewed:    EKG:  EKG is  ordered today.  The ekg ordered today demonstrates sinus tachycardia, heart rate 101, normal axis, QTC 471, no acute ST-T wave changes, frequent PVCs  Recent Labs: 03/18/2017: TSH 1.97 07/24/2017: ALT 23 07/29/2017: BUN 14; Creatinine, Ser 0.83; Hemoglobin 11.2; Platelets 146; Potassium 3.9; Sodium 139   Recent Lipid Panel Lab Results  Component Value Date/Time   CHOL 263 (H) 07/25/2017 10:51 AM   TRIG 58 07/25/2017 10:51 AM   HDL 78 07/25/2017 10:51 AM   CHOLHDL 3.4 07/25/2017 10:51 AM   LDLCALC 173 (H) 07/25/2017 10:51 AM   LDLDIRECT 115 (H) 09/03/2017 12:09 PM   LDLDIRECT 155.5 05/14/2011  09:24 AM    Physical Exam:    VS:  BP (!) 158/90   Pulse (!) 101   Ht 5\' 6"  (1.676 m)   Wt 175 lb (79.4 kg)   SpO2 98%   BMI 28.25 kg/m     Wt Readings from Last 3 Encounters:  09/03/17 175 lb (79.4 kg)  08/12/17 175 lb (79.4 kg)  07/29/17 169 lb 12.1 oz (77 kg)     Physical Exam  Constitutional: She is oriented to person, place, and time. She appears well-developed and well-nourished. No distress.  HENT:  Head: Normocephalic and atraumatic.  Eyes: No scleral icterus.  Neck: Neck supple. No JVD present.  Cardiovascular: Normal rate, S1 normal and S2 normal. A regularly irregular rhythm present.  No murmur heard. Pulmonary/Chest: Effort normal. She has no wheezes. She has no rales.  Abdominal: Soft. There is no hepatomegaly.  Musculoskeletal: She exhibits no edema.  Neurological: She is alert and oriented to person, place, and time.  Skin: Skin is warm and dry.  Psychiatric: She has a normal mood and affect.    ASSESSMENT & PLAN:    Coronary artery disease involving native coronary artery of native heart with angina pectoris [facility]) She is status post PCI with drug-eluting stent to the RCA in June 2019 in the setting of unstable angina.  She has residual disease in the small, ostial diagonal.  Medical therapy  has been recommended.  When last seen, I adjusted her antianginal therapy.  However, she continues to have anginal symptoms.  They have somewhat escalated since she was last seen.  She is also having symptoms that sound somewhat consistent with side effects to Ticagrelor.  I discussed her case today with Dr. Claiborne Billings.  He felt that if she had refractory angina, consideration could be given toward approaching her diagonal with cutting balloon angioplasty.  Her symptoms are creating more more anxiety for her.  She is eager to get into cardiac rehab.  Unfortunately, their schedule is booked out through the end of August.  I reviewed her case today with Dr. Johnsie Cancel (attending MD).  As she did not have ACS, he agreed with changing her Ticagrelor to Clopidogrel.  He also agreed with proceeding with cardiac catheterization.  Risks and benefits of cardiac catheterization have been discussed with the patient.  These include bleeding, infection, kidney damage, stroke, heart attack, death.  The patient understands these risks and is willing to proceed.    -Continue aspirin, statin, nitrates, amlodipine  -DC Ticagrelor  -Start Clopidogrel 75 mg daily  -Increase metoprolol tartrate to 50 mg twice daily  -Arrange cardiac catheterization tomorrow (she requests Dr. Burt Knack)  -She had minimal response to high dose sedation during last cath   Essential hypertension Blood pressure remains uncontrolled.  Adjust metoprolol as noted.  Familial hypercholesterolemia Continue high-dose statin.  She has been evaluated by the lipid clinic.  Repeat LDL is pending.  She may be a candidate for ezetimibe versus PCSK9 inhibitor.  Anxiety She is having significant anxiety.  She was given anxiolytics in the past.  I have agreed to give her one prescription for Xanax 0.25 mg daily as needed.  She will be given #5, no refills.  She should have future refills with her primary care provider.  PVCs Question if related to stress.  She had  normal ejection fraction by echocardiogram in the past.  Obtain BMET today.  Adjust beta-blocker as noted.   Dispo:  Return in about 2 weeks (around 09/17/2017) for Post  Procedure Follow Up w/ Dr. Sallyanne Kuster, or Richardson Dopp, PA-C.   Medication Adjustments/Labs and Tests Ordered: Current medicines are reviewed at length with the patient today.  Concerns regarding medicines are outlined above.  Tests Ordered: Orders Placed This Encounter  Procedures  . Basic Metabolic Panel (BMET)  . CBC  . EKG 12-Lead   Medication Changes: Meds ordered this encounter  Medications  . metoprolol tartrate (LOPRESSOR) 50 MG tablet    Sig: Take 1 tablet (50 mg total) by mouth 2 (two) times daily.    Dispense:  180 tablet    Refill:  3  . clopidogrel (PLAVIX) 75 MG tablet    Sig: Take 1 tablet (75 mg total) by mouth daily.    Dispense:  90 tablet    Refill:  3  . ALPRAZolam (XANAX) 0.25 MG tablet    Sig: Take 1 tablet (0.25 mg total) by mouth daily as needed for anxiety.    Dispense:  5 tablet    Refill:  0    THIS IS A ONE TIME REFILL; FURTHER REFILLS TO BE DONE WITH PRIMARY CARE    Signed, Richardson Dopp, PA-C  09/03/2017 5:19 PM    [facility] [place], [place], [place]  [address] Phone: [phone]; Fax: [phone]   Patient examined chart reviewed Discussed care with PA Some of her pain may be non cardiac given her overall anxiety levels. Review of her cath films shows a small diagonal branch with an ostial stenosis that would have to Be Rx with cutting balloon Too small for stenting and may jeopardize flow down larger LAD Continue current meds Change to plavix increase beta blocker and plan for cath tomorrow  Jenkins Rouge

## 2017-09-03 NOTE — Patient Instructions (Addendum)
Medication Instructions:  1. START PLAVIX 75 MG DAILY; START TOMORROW MORNING 09/04/17  2. STOP BRILINTA ; LAST DOSE TONIGHT   3. INCREASE METOPROLOL TARTRATE TO 50 MG TWICE DAILY; NEW RX HAS BEEN SENT IN  4. A ONE TIME PRESCRIPTION FOR XANAX 0.25 MG DAILY AS NEEDED # 5 WITH NO REFILLS   Labwork: TODAY CBC, BMET  Testing/Procedures: Your physician has requested that you have a cardiac catheterization. Cardiac catheterization is used to diagnose and/or treat various heart conditions. Doctors may recommend this procedure for a number of different reasons. The most common reason is to evaluate chest pain. Chest pain can be a symptom of coronary artery disease (CAD), and cardiac catheterization can show whether plaque is narrowing or blocking your heart's arteries. This procedure is also used to evaluate the valves, as well as measure the blood flow and oxygen levels in different parts of your heart. For further information please visit [email or web address].tn. Please follow instruction sheet, as given.   Follow-Up: Richardson Dopp, [facility] 10/01/17 @ 11:45   Any Other Special Instructions Will Be Listed Below (If Applicable).  If you need a refill on your cardiac medications before your next appointment, please call your pharmacy.    [place] OFFICE [address] [place] [address] Dept: [phone] Loc: [place]  09/03/2017  You are scheduled for a Cardiac Catheterization on Thursday, August 1 with Dr. Sherren Mocha.  1. Please arrive at the [facility] (Main Entrance A) at [facility]: [address] [place], [place] [address] at 10:00 AM (This time is two hours before your procedure to ensure your preparation). Free valet parking service is available.   Special note: Every effort is made to have your procedure done on time. Please understand that emergencies sometimes  delay scheduled procedures.  2. Diet: Do not eat solid foods after midnight.  The patient may have clear liquids until 5am upon the day of the procedure.  3. Labs: You will need to have blood drawn on Wednesday, July 31 at [facility]. [address]  Open: 7:30am - 5pm    Phone: [phone]. You do not need to be fasting.  4. Medication instructions in preparation for your procedure:   Contrast Allergy: No   On the morning of your procedure, take your Aspirin and PLAVIX  AND any morning medicines NOT listed above.  You may use sips of water.  5. Plan for one night stay--bring personal belongings. 6. Bring a current list of your medications and current insurance cards. 7. You MUST have a responsible person to drive you home. 8. Someone MUST be with you the first 24 hours after you arrive home or your discharge will be delayed. 9. Please wear clothes that are easy to get on and off and wear slip-on shoes.  Thank you for allowing [place] to care for you!   -- [facility]

## 2017-09-03 NOTE — Progress Notes (Signed)
Patient ID: Mychaela Lennartz Ells                 DOB: 1965-09-19                    MRN: [id number]     HPI: Nancy Sparks is a 52 y.o. female patient of Dr Sallyanne Kuster referred to lipid clinic by Richardson Dopp, PA. Pt was admitted with unstable angina on 6/20, cath demonstrated 2 vessel CAD with 75% ostial D1 stenosis and 90% mid RCA stenosis. RCA was treated with PCI with DES. LDL was 173 at time of admission, however pt was not taking rosuvastatin 10mg  on a regular basis. She was discharged on 6/25 on rosuvastatin 40mg  daily.  Since then, pt reports adherence to her rosuvastatin. She was previously not adherent with therapy due to dislike for taking medications. However, her stent has resulted in her increasing compliance. She has not experienced any side effects on her rosuvastatin. Does have some SOB likely related to Brilinta and headache likely related to isosorbide. She will discuss these concerns with Richardson Dopp at visit later today.  Pt has a strong family history of heart disease. Her father underwent quadruple bypass surgery in his 73s. Pt has been under a great deal of stress lately as her mother passed recently and was buried yesterday. She is also sending kids off to college soon. Pt is comfortable with injections and has heard benefits of PCSK9i from healthcare family members.  Current Medications: rosuvastatin 40mg  daily - adherent for the past 5 weeks Risk Factors: CAD s/p PCI and DES, unstable angina LDL goal: 70mg /dL  Diet: Egg in the morning. Tries to use weight watchers. Does not eat fried food. Occasional red meat a few times a week. Likes fruit and vegetables.  Exercise: Plays tennis, very active.  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 6 glasses of wine per  week.  Labs: 09/03/17: LDL-D 115 (rosuvastatin 40mg  daily for the past 5 weeks) 07/25/17: TC 263, TG 58, HDL 78, LDL 173 (rosuvastatin prescribed but pt not taking consistently) 07/06/15: TC 337, TG 114, HDL 97, LDL 217 (rosuvastatin prescribed but pt not taking consistently)  Past Medical History:  Diagnosis Date  . Anemia    "w/pregnancies  . Anxiety    hx  . CAD (coronary artery disease)    LHC 6/19: pLAD, oD1 75, pRCA 20, mRCA 90 >> PCI: DES to RCA // Echo 6/19: EF 55-60, normal wall motion  . Complication of anesthesia    itching relieved with benadryl w/all my surgeries (07/24/2017)  . Depression    hx  . Dyslipidemia   . Fibroid   . GERD (gastroesophageal reflux disease)   . Heart murmur    hx  . Heart palpitations    hx of  . Hx of adenomatous polyp of colon 12/24/2002  . Hypertension     Current Outpatient Medications on File Prior to Visit  Medication Sig Dispense Refill  . ALPRAZolam (XANAX) 0.25 MG tablet Take 1 tablet (0.25 mg total) by mouth at bedtime as needed for anxiety. 30 tablet 3  . amLODipine (NORVASC) 2.5 MG tablet Take 1 tablet (2.5 mg total) by mouth daily. 90 tablet 3  . aspirin EC 81 MG EC tablet Take 1 tablet (81 mg total) by mouth  daily. 90 tablet 3  . estradiol (VIVELLE-DOT) 0.05 MG/24HR patch Place 1 patch (0.05 mg total) onto the skin 2 (two) times a week. 24 patch 4  . ibuprofen (ADVIL,MOTRIN) 200 MG tablet Take 600-800 mg by mouth every 6 (six) hours as needed (pain).     . isosorbide mononitrate (IMDUR) 30 MG 24 hr tablet Take 1 tablet (30 mg total) by mouth daily. 90 tablet 3  . metoprolol tartrate (LOPRESSOR) 25 MG tablet Take 1 tablet (25 mg total) by mouth 2 (two) times daily. 180 tablet 3  . nitroGLYCERIN (NITROSTAT) 0.4 MG SL tablet Place 1 tablet (0.4 mg total) under the tongue every 5 (five) minutes as needed for chest pain. 90 tablet 3  . PROGESTERONE MICRONIZED PO Take by mouth as directed.    . rosuvastatin (CRESTOR) 40 MG tablet Take  1 tablet (40 mg total) by mouth at bedtime. 90 tablet 3  . Tetrahydrozoline HCl (VISINE OP) Place 1 drop into both eyes daily as needed (dry eyes).    . ticagrelor (BRILINTA) 90 MG TABS tablet Take 1 tablet (90 mg total) by mouth 2 (two) times daily. 180 tablet 3  . trazodone (DESYREL) 300 MG tablet Take 300 mg by mouth as directed.    . [DISCONTINUED] calcium carbonate (OS-CAL) 600 MG TABS Take 600 mg by mouth 2 (two) times daily with meals.       No current facility-administered medications on file prior to visit.     Allergies  Allergen Reactions  . Latex Rash  . Tape Rash    Paper tape ok    Assessment/Plan:  1. Hyperlipidemia - Will recheck direct LDL today as last lipid panel reflected poor adherence to rosuvastatin 10mg  daily and pt has been adherent to rosuvastatin 40mg  daily for the past 5 weeks. LDL goal is < 70. Update: LDL back at 115 above goal < 70. Zetia will not bring LDL to goal with anticipated 20% LDL lowering. Will start prior authorization for Praluent injections.   Megan E. Supple, PharmD, BCACP, [place] [address] [place], [place] [address] Phone: [phone]; Fax: [phone] 09/03/2017 12:18 PM

## 2017-09-04 ENCOUNTER — Telehealth: Payer: Self-pay | Admitting: *Deleted

## 2017-09-04 ENCOUNTER — Encounter (HOSPITAL_COMMUNITY)
Admission: RE | Disposition: A | Discharge status: Home / Self Care | Payer: Self-pay | Source: Ambulatory Visit | Attending: Cardiovascular Disease

## 2017-09-04 ENCOUNTER — Ambulatory Visit (HOSPITAL_COMMUNITY)
Admission: RE | Admit: 2017-09-04 | Discharge: 2017-09-04 | Disposition: A | Discharge status: Home / Self Care | Payer: BLUE CROSS/BLUE SHIELD | Source: Ambulatory Visit | Attending: Cardiovascular Disease | Admitting: Cardiovascular Disease

## 2017-09-04 ENCOUNTER — Other Ambulatory Visit: Payer: Self-pay

## 2017-09-04 DIAGNOSIS — Z91040 Latex allergy status: Secondary | ICD-10-CM | POA: Diagnosis not present

## 2017-09-04 DIAGNOSIS — Z955 Presence of coronary angioplasty implant and graft: Secondary | ICD-10-CM | POA: Diagnosis not present

## 2017-09-04 DIAGNOSIS — Z8249 Family history of ischemic heart disease and other diseases of the circulatory system: Secondary | ICD-10-CM | POA: Insufficient documentation

## 2017-09-04 DIAGNOSIS — K219 Gastro-esophageal reflux disease without esophagitis: Secondary | ICD-10-CM | POA: Diagnosis not present

## 2017-09-04 DIAGNOSIS — Z86010 Personal history of colonic polyps: Secondary | ICD-10-CM | POA: Diagnosis not present

## 2017-09-04 DIAGNOSIS — Z888 Allergy status to other drugs, medicaments and biological substances status: Secondary | ICD-10-CM | POA: Insufficient documentation

## 2017-09-04 DIAGNOSIS — Z7902 Long term (current) use of antithrombotics/antiplatelets: Secondary | ICD-10-CM | POA: Insufficient documentation

## 2017-09-04 DIAGNOSIS — F329 Major depressive disorder, single episode, unspecified: Secondary | ICD-10-CM | POA: Insufficient documentation

## 2017-09-04 DIAGNOSIS — I10 Essential (primary) hypertension: Secondary | ICD-10-CM | POA: Diagnosis not present

## 2017-09-04 DIAGNOSIS — F419 Anxiety disorder, unspecified: Secondary | ICD-10-CM | POA: Diagnosis not present

## 2017-09-04 DIAGNOSIS — I25119 Atherosclerotic heart disease of native coronary artery with unspecified angina pectoris: Secondary | ICD-10-CM | POA: Insufficient documentation

## 2017-09-04 DIAGNOSIS — E7801 Familial hypercholesterolemia: Secondary | ICD-10-CM | POA: Insufficient documentation

## 2017-09-04 DIAGNOSIS — Z7982 Long term (current) use of aspirin: Secondary | ICD-10-CM | POA: Insufficient documentation

## 2017-09-04 DIAGNOSIS — Z87891 Personal history of nicotine dependence: Secondary | ICD-10-CM | POA: Diagnosis not present

## 2017-09-04 DIAGNOSIS — Z79899 Other long term (current) drug therapy: Secondary | ICD-10-CM | POA: Diagnosis not present

## 2017-09-04 DIAGNOSIS — I493 Ventricular premature depolarization: Secondary | ICD-10-CM | POA: Diagnosis not present

## 2017-09-04 DIAGNOSIS — Z9851 Tubal ligation status: Secondary | ICD-10-CM | POA: Diagnosis not present

## 2017-09-04 DIAGNOSIS — Z9889 Other specified postprocedural states: Secondary | ICD-10-CM | POA: Diagnosis not present

## 2017-09-04 HISTORY — PX: LEFT HEART CATH AND CORONARY ANGIOGRAPHY: CATH118249

## 2017-09-04 SURGERY — LEFT HEART CATH AND CORONARY ANGIOGRAPHY
Anesthesia: LOCAL

## 2017-09-04 MED ORDER — HEPARIN (PORCINE) IN NACL 1000-0.9 UT/500ML-% IV SOLN
INTRAVENOUS | Status: DC | PRN
  Administered 2017-09-04 (×2): 500 mL

## 2017-09-04 MED ORDER — SODIUM CHLORIDE 0.9 % WEIGHT BASED INFUSION: 1.0000 mL/kg/h | INTRAVENOUS | Status: DC

## 2017-09-04 MED ORDER — VERAPAMIL HCL 2.5 MG/ML IV SOLN
INTRAVENOUS | Status: DC | PRN
  Administered 2017-09-04: 10 mL via INTRA_ARTERIAL

## 2017-09-04 MED ORDER — MIDAZOLAM HCL 2 MG/2ML IJ SOLN
INTRAMUSCULAR | Status: AC
  Filled 2017-09-04: qty 2

## 2017-09-04 MED ORDER — SODIUM CHLORIDE 0.9% FLUSH: 3.0000 mL | Freq: Two times a day (BID) | INTRAVENOUS | Status: DC

## 2017-09-04 MED ORDER — MIDAZOLAM HCL 2 MG/2ML IJ SOLN
INTRAMUSCULAR | Status: DC | PRN
  Administered 2017-09-04 (×2): 2 mg via INTRAVENOUS

## 2017-09-04 MED ORDER — FENTANYL CITRATE (PF) 100 MCG/2ML IJ SOLN
INTRAMUSCULAR | Status: AC
  Filled 2017-09-04: qty 2

## 2017-09-04 MED ORDER — LIDOCAINE HCL (PF) 1 % IJ SOLN
INTRAMUSCULAR | Status: AC
  Filled 2017-09-04: qty 30

## 2017-09-04 MED ORDER — SODIUM CHLORIDE 0.9 % WEIGHT BASED INFUSION
3.0000 mL/kg/h | INTRAVENOUS | Status: AC
  Administered 2017-09-04: 3 mL/kg/h via INTRAVENOUS

## 2017-09-04 MED ORDER — LIDOCAINE HCL (PF) 1 % IJ SOLN
INTRAMUSCULAR | Status: DC | PRN
  Administered 2017-09-04: 2 mL

## 2017-09-04 MED ORDER — ASPIRIN 81 MG PO CHEW: 81.0000 mg | CHEWABLE_TABLET | ORAL | Status: DC

## 2017-09-04 MED ORDER — SODIUM CHLORIDE 0.9% FLUSH: 3.0000 mL | INTRAVENOUS | Status: DC | PRN

## 2017-09-04 MED ORDER — SODIUM CHLORIDE 0.9 % IV SOLN: 250.0000 mL | INTRAVENOUS | Status: DC | PRN

## 2017-09-04 MED ORDER — IOPAMIDOL (ISOVUE-370) INJECTION 76%
INTRAVENOUS | Status: DC | PRN
  Administered 2017-09-04: 50 mL via INTRA_ARTERIAL

## 2017-09-04 MED ORDER — ACETAMINOPHEN 325 MG PO TABS: 650.0000 mg | ORAL_TABLET | ORAL | Status: DC | PRN

## 2017-09-04 MED ORDER — FENTANYL CITRATE (PF) 100 MCG/2ML IJ SOLN
INTRAMUSCULAR | Status: DC | PRN
  Administered 2017-09-04 (×2): 50 ug via INTRAVENOUS

## 2017-09-04 MED ORDER — HEPARIN (PORCINE) IN NACL 1000-0.9 UT/500ML-% IV SOLN
INTRAVENOUS | Status: AC
  Filled 2017-09-04: qty 500

## 2017-09-04 MED ORDER — ONDANSETRON HCL 4 MG/2ML IJ SOLN: 4.0000 mg | Freq: Four times a day (QID) | INTRAMUSCULAR | Status: DC | PRN

## 2017-09-04 MED ORDER — HEPARIN SODIUM (PORCINE) 1000 UNIT/ML IJ SOLN
INTRAMUSCULAR | Status: DC | PRN
  Administered 2017-09-04: 4000 [IU] via INTRAVENOUS

## 2017-09-04 MED ORDER — VERAPAMIL HCL 2.5 MG/ML IV SOLN
INTRAVENOUS | Status: AC
  Filled 2017-09-04: qty 2

## 2017-09-04 SURGICAL SUPPLY — 10 items
CATH 5FR JL3.5 JR4 ANG PIG MP (CATHETERS) ×2 IMPLANT
DEVICE RAD COMP TR BAND LRG (VASCULAR PRODUCTS) ×2 IMPLANT
GLIDESHEATH SLEND SS 6F .021 (SHEATH) ×2 IMPLANT
GUIDEWIRE INQWIRE 1.5J.035X260 (WIRE) ×1 IMPLANT
INQWIRE 1.5J .035X260CM (WIRE) ×2
KIT HEART LEFT (KITS) ×2 IMPLANT
PACK CARDIAC CATHETERIZATION (CUSTOM PROCEDURE TRAY) ×2 IMPLANT
TRANSDUCER W/STOPCOCK (MISCELLANEOUS) ×2 IMPLANT
TUBING CIL FLEX 10 FLL-RA (TUBING) ×2 IMPLANT
WIRE HI TORQ VERSACORE-J 145CM (WIRE) ×2 IMPLANT

## 2017-09-04 NOTE — Telephone Encounter (Signed)
DPR ok to lmom. Lmom lab work good. No changes to be made with medications at this time. Continue on current Tx plan. If any questions please call [phone].

## 2017-09-04 NOTE — Discharge Instructions (Signed)

## 2017-09-04 NOTE — Interval H&P Note (Signed)
Cath Lab Visit (complete for each Cath Lab visit)  Clinical Evaluation Leading to the Procedure:   ACS: No.  Non-ACS:    Anginal Classification: CCS II  Anti-ischemic medical therapy: Maximal Therapy (2 or more classes of medications)  Non-Invasive Test Results: No non-invasive testing performed  Prior CABG: No previous CABG      History and Physical Interval Note:  09/04/2017 11:02 AM  Nancy Sparks  has presented today for surgery, with the diagnosis of cad   angina  The various methods of treatment have been discussed with the patient and family. After consideration of risks, benefits and other options for treatment, the patient has consented to  Procedure(s): LEFT HEART CATH AND CORONARY ANGIOGRAPHY (N/A) as a surgical intervention .  The patient's history has been reviewed, patient examined, no change in status, stable for surgery.  I have reviewed the patient's chart and labs.  Questions were answered to the patient's satisfaction.     Sherren Mocha

## 2017-09-04 NOTE — Telephone Encounter (Signed)
-----   Message from Liliane Shi, PA-C sent at 09/03/2017  5:51 PM EDT ----- All values are normal or within acceptable limits.   Medication changes / Follow up labs / Other changes or recommendations:   Continue current medications and follow up as planned.  Richardson Dopp, PA-C 09/03/2017 5:50 PM

## 2017-09-04 NOTE — Progress Notes (Addendum)
[facility] BAND REMOVAL  LOCATION:    Right radial  DEFLATED PER PROTOCOL:    Yes.    TIME BAND OFF / DRESSING APPLIED:    1445p  Gauze and tegaderm covered with Coban  SITE UPON ARRIVAL:    Level 0  SITE AFTER BAND REMOVAL:    Level 0  CIRCULATION SENSATION AND MOVEMENT:    Within Normal Limits   Yes.    COMMENTS:   Dr Burt Knack in to see pt and family.  No problem at site

## 2017-09-05 ENCOUNTER — Telehealth: Payer: Self-pay

## 2017-09-05 ENCOUNTER — Encounter (HOSPITAL_COMMUNITY): Payer: Self-pay | Admitting: Cardiovascular Disease

## 2017-09-05 NOTE — Telephone Encounter (Signed)
Notes recorded by Frederik Schmidt, RN on 09/05/2017 at 2:04 PM EDT Spoke with patient with results/recommendations. She verbalized understanding and will await Megan's call. ------

## 2017-09-05 NOTE — Telephone Encounter (Signed)
-----   Message from Liliane Shi, [place] sent at 09/05/2017  1:46 PM EDT ----- The following abnormalities are noted:  LDL elevated (goal < 70). All other values are normal, stable or within acceptable limits. Medication changes / Follow up labs / Other changes or recommendations:   - Fuller Canada, PharmD with the [facility] will work on getting her approved for Praluent. Richardson Dopp, PA-C 09/05/2017 1:43 PM

## 2017-09-08 ENCOUNTER — Telehealth: Payer: Self-pay | Admitting: Pharmacist

## 2017-09-08 MED ORDER — EVOLOCUMAB 140 MG/ML ~~LOC~~ SOAJ: 1.0000 "pen " | SUBCUTANEOUS | 11 refills | Status: DC

## 2017-09-08 NOTE — Telephone Encounter (Signed)
Insurance prefers Repatha over [place], [place] has been approved. Rx sent to specialty pharmacy, pt qualifies for $5 copay. Pt is aware.

## 2017-09-16 ENCOUNTER — Telehealth (HOSPITAL_COMMUNITY): Payer: Self-pay

## 2017-09-16 ENCOUNTER — Telehealth: Payer: Self-pay | Admitting: Pharmacist

## 2017-09-16 DIAGNOSIS — E782 Mixed hyperlipidemia: Secondary | ICD-10-CM

## 2017-09-16 NOTE — Telephone Encounter (Signed)
Pt presented today for Repatha injection training. She successfully self administered Repatha into her right thigh with no adverse effects. Follow up lab work scheduled in 6 weeks.

## 2017-09-23 NOTE — Telephone Encounter (Signed)
Attempted to call patient to review allergies and medications before Cardiac Rehab orientation. Unable to reach.  Nancy Sparks, Sherian Rein D PGY1 Pharmacy Resident  Phone [phone] 09/23/2017      6:08 PM

## 2017-09-25 ENCOUNTER — Encounter (HOSPITAL_COMMUNITY)
Admission: RE | Admit: 2017-09-25 | Discharge: 2017-09-25 | Disposition: A | Discharge status: Home / Self Care | Payer: BLUE CROSS/BLUE SHIELD | Source: Ambulatory Visit | Attending: Cardiovascular Disease | Admitting: Cardiovascular Disease

## 2017-09-25 VITALS — BP 108/72 | Ht 66.25 in | Wt 175.9 lb

## 2017-09-25 DIAGNOSIS — Z7902 Long term (current) use of antithrombotics/antiplatelets: Secondary | ICD-10-CM | POA: Insufficient documentation

## 2017-09-25 DIAGNOSIS — Z79899 Other long term (current) drug therapy: Secondary | ICD-10-CM | POA: Insufficient documentation

## 2017-09-25 DIAGNOSIS — Z7982 Long term (current) use of aspirin: Secondary | ICD-10-CM | POA: Insufficient documentation

## 2017-09-25 DIAGNOSIS — Z955 Presence of coronary angioplasty implant and graft: Secondary | ICD-10-CM | POA: Insufficient documentation

## 2017-09-25 DIAGNOSIS — Z86010 Personal history of colonic polyps: Secondary | ICD-10-CM | POA: Insufficient documentation

## 2017-09-25 DIAGNOSIS — F329 Major depressive disorder, single episode, unspecified: Secondary | ICD-10-CM | POA: Insufficient documentation

## 2017-09-25 DIAGNOSIS — K219 Gastro-esophageal reflux disease without esophagitis: Secondary | ICD-10-CM | POA: Insufficient documentation

## 2017-09-25 DIAGNOSIS — F419 Anxiety disorder, unspecified: Secondary | ICD-10-CM | POA: Insufficient documentation

## 2017-09-25 DIAGNOSIS — I2510 Atherosclerotic heart disease of native coronary artery without angina pectoris: Secondary | ICD-10-CM | POA: Insufficient documentation

## 2017-09-25 DIAGNOSIS — I10 Essential (primary) hypertension: Secondary | ICD-10-CM | POA: Insufficient documentation

## 2017-09-25 DIAGNOSIS — Z87891 Personal history of nicotine dependence: Secondary | ICD-10-CM | POA: Insufficient documentation

## 2017-09-25 DIAGNOSIS — E785 Hyperlipidemia, unspecified: Secondary | ICD-10-CM | POA: Insufficient documentation

## 2017-09-25 NOTE — Progress Notes (Signed)
Nancy Sparks 52 y.o. female DOB: 25-Sep-1965 MRN: [id number]      Nutrition Note  No diagnosis found. Past Medical History:  Diagnosis Date  . Anemia    "w/pregnancies  . Anxiety    hx  . CAD (coronary artery disease)    LHC 6/19: pLAD, oD1 75, pRCA 20, mRCA 90 >> PCI: DES to RCA // Echo 6/19: EF 55-60, normal wall motion  . Complication of anesthesia    itching relieved with benadryl w/all my surgeries (07/24/2017)  . Depression    hx  . Dyslipidemia   . Fibroid   . GERD (gastroesophageal reflux disease)   . Heart murmur    hx  . Heart palpitations    hx of  . Hx of adenomatous polyp of colon 12/24/2002  . Hypertension    Meds reviewed. Lopressor, crestor noted  HT: Ht Readings from Last 1 Encounters:  09/25/17 5' 6.25" (1.683 m)    WT: Wt Readings from Last 5 Encounters:  09/25/17 175 lb 14.8 oz (79.8 kg)  09/04/17 175 lb (79.4 kg)  09/03/17 175 lb (79.4 kg)  08/12/17 175 lb (79.4 kg)  07/29/17 169 lb 12.1 oz (77 kg)     Body mass index is 28.18 kg/m.   Current tobacco use? no      Labs:  Lipid Panel     Component Value Date/Time   CHOL 263 (H) 07/25/2017 1051   TRIG 58 07/25/2017 1051   HDL 78 07/25/2017 1051   CHOLHDL 3.4 07/25/2017 1051   VLDL 12 07/25/2017 1051   LDLCALC 173 (H) 07/25/2017 1051   LDLDIRECT 115 (H) 09/03/2017 1209   LDLDIRECT 155.5 05/14/2011 0924    No results found for: HGBA1C CBG (last 3)  No results for input(s): GLUCAP in the last 72 hours.  Nutrition Note Spoke with pt. Nutrition plan and goals reviewed with pt. Pt is following Step 1 of the Therapeutic Lifestyle Changes diet. Pt wants to lose wt. Pt has not actively been trying to lose wt. Wt loss tips reviewed (label reading, how to build a healthy plate, portion sizes, eating frequently across the day). 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 Per discussion, pt does not use canned/convenience foods often. Pt rarely adds salt to food. Pt eats out frequently 3x a week. Pt expressed understanding of the information reviewed. Pt aware of nutrition education classes offered and plans on attending nutrition classes.  Nutrition Diagnosis ? Food-and nutrition-related knowledge deficit related to lack of exposure to information as related to diagnosis of: ? CVD ? Overweight related to excessive energy intake as evidenced by a Body mass index is 28.18 kg/m.  Nutrition Intervention ? Pt's individual nutrition plan and goals reviewed with pt. ? Pt given handouts for: ? Nutrition I class ? Nutrition II class  ? Diabetes Blitz Class ? Diabetes Q & A class  ? Consistent vit K diet ? low sodium ? DM ? pre-diabetes  Nutrition Goal(s):   ? Pt to identify and limit food sources of saturated fat, trans fat, refined carbohydrates and sodium ? Pt to identify food quantities necessary to achieve weight loss of 6-24 lbs. at graduation from cardiac rehab. Goal wt loss of 15-20 lb desired.   Plan:  ? Pt to attend nutrition classes ? Nutrition I ? Nutrition II ? Portion Distortion  ? Will provide client-centered  nutrition education as part of interdisciplinary care ? Monitor and evaluate progress toward nutrition goal with team.   Laurina Bustle, MS, RD, LDN 09/25/2017 9:54 AM

## 2017-09-25 NOTE — Progress Notes (Signed)
Cardiac Individual Treatment Plan  Patient Details  Name: Nancy Sparks MRN: [id number] Date of Birth: 1965-09-30 Referring Provider:     CARDIAC REHAB PHASE II ORIENTATION from 09/25/2017 in [place]  Referring Provider  Mertie Moores MD      Initial Encounter Date:    CARDIAC REHAB PHASE II ORIENTATION from 09/25/2017 in [place]  Date  09/25/17      Visit Diagnosis: 07/28/17 Stented coronary artery  Patient's Home Medications on Admission:  Current Outpatient Medications:  .  ALPRAZolam (XANAX) 0.25 MG tablet, Take 1 tablet (0.25 mg total) by mouth daily as needed for anxiety., Disp: 5 tablet, Rfl: 0 .  amLODipine (NORVASC) 2.5 MG tablet, Take 1 tablet (2.5 mg total) by mouth daily., Disp: 90 tablet, Rfl: 3 .  aspirin EC 81 MG EC tablet, Take 1 tablet (81 mg total) by mouth daily., Disp: 90 tablet, Rfl: 3 .  clopidogrel (PLAVIX) 75 MG tablet, Take 1 tablet (75 mg total) by mouth daily., Disp: 90 tablet, Rfl: 3 .  Evolocumab (REPATHA SURECLICK) 497 MG/ML SOAJ, Inject 1 pen into the skin every 14 (fourteen) days., Disp: 2 pen, Rfl: 11 .  isosorbide mononitrate (IMDUR) 30 MG 24 hr tablet, Take 1 tablet (30 mg total) by mouth daily., Disp: 90 tablet, Rfl: 3 .  metoprolol tartrate (LOPRESSOR) 50 MG tablet, Take 1 tablet (50 mg total) by mouth 2 (two) times daily., Disp: 180 tablet, Rfl: 3 .  nitroGLYCERIN (NITROSTAT) 0.4 MG SL tablet, Place 1 tablet (0.4 mg total) under the tongue every 5 (five) minutes as needed for chest pain., Disp: 90 tablet, Rfl: 3 .  rosuvastatin (CRESTOR) 40 MG tablet, Take 1 tablet (40 mg total) by mouth at bedtime., Disp: 90 tablet, Rfl: 3 .  ibuprofen (ADVIL,MOTRIN) 200 MG tablet, Take 600-800 mg by mouth every 6 (six) hours as needed (pain). , Disp: , Rfl:  .  Tetrahydrozoline HCl (VISINE OP), Place 1 drop into both eyes daily as needed (dry eyes)., Disp: , Rfl:  .  trazodone (DESYREL) 300 MG  tablet, Take 300 mg by mouth as directed., Disp: , Rfl:   Past Medical History: Past Medical History:  Diagnosis Date  . Anemia    "w/pregnancies  . Anxiety    hx  . CAD (coronary artery disease)    LHC 6/19: pLAD, oD1 75, pRCA 20, mRCA 90 >> PCI: DES to RCA // Echo 6/19: EF 55-60, normal wall motion  . Complication of anesthesia    itching relieved with benadryl w/all my surgeries (07/24/2017)  . Depression    hx  . Dyslipidemia   . Fibroid   . GERD (gastroesophageal reflux disease)   . Heart murmur    hx  . Heart palpitations    hx of  . Hx of adenomatous polyp of colon 12/24/2002  . Hypertension     Tobacco Use: Social History   Tobacco Use  Smoking Status Former Smoker  . Packs/day: 0.10  . Years: 2.00  . Pack years: 0.20  . Types: Cigarettes  Smokeless Tobacco Never Used  Tobacco Comment   quit smoking in college    Labs: Recent Review Flowsheet Data    Labs for ITP Cardiac and Pulmonary Rehab Latest Ref Rng & Units 11/05/2013 04/03/2015 07/06/2015 07/25/2017 09/03/2017   Cholestrol 0 - 200 mg/dL 234(H) 250(H) 337(H) 263(H) -   LDLCALC 0 - 99 mg/dL 146(H) 160(H) 217(H) 173(H) -   LDLDIRECT 0 -  99 mg/dL - - - - 115(H)   HDL >40 mg/dL 62 74 97 78 -   Trlycerides <150 mg/dL 131 79 114 58 -      Capillary Blood Glucose: No results found for: GLUCAP   Exercise Target Goals: Exercise Program Goal: Individual exercise prescription set using results from initial 6 min walk test and THRR while considering  patient's activity barriers and safety.   Exercise Prescription Goal: Initial exercise prescription builds to 30-45 minutes a day of aerobic activity, 2-3 days per week.  Home exercise guidelines will be given to patient during program as part of exercise prescription that the participant will acknowledge.  Activity Barriers & Risk Stratification: Activity Barriers & Cardiac Risk Stratification - 09/25/17 1153      Activity Barriers & Cardiac Risk  Stratification   Activity Barriers  Back Problems;Deconditioning;Muscular Weakness    Cardiac Risk Stratification  High       6 Minute Walk: 6 Minute Walk    Row Name 09/25/17 1152         6 Minute Walk   Phase  Initial     Distance  1866 feet     Walk Time  6 minutes     # of Rest Breaks  0     MPH  3.5     METS  4.7     RPE  11     VO2 Peak  16.5     Symptoms  No     Resting HR  66 bpm     Resting BP  118/78     Resting Oxygen Saturation   100 %     Exercise Oxygen Saturation  during 6 min walk  98 %     Max Ex. HR  93 bpm     Max Ex. BP  124/80     2 Minute Post BP  108/72        Oxygen Initial Assessment:   Oxygen Re-Evaluation:   Oxygen Discharge (Final Oxygen Re-Evaluation):   Initial Exercise Prescription: Initial Exercise Prescription - 09/25/17 1100      Date of Initial Exercise RX and Referring Provider   Date  09/25/17    Referring Provider  Nahser, Philip MD      Treadmill   MPH  3    Grade  0    Minutes  10    METs  3.3      NuStep   Level  4    SPM  80    Minutes  10    METs  3      Rower   Level  2    Watts  30    Minutes  10    METs  4.8      Prescription Details   Frequency (times per week)  3    Duration  Progress to 30 minutes of continuous aerobic without signs/symptoms of physical distress      Intensity   THRR 40-80% of Max Heartrate  67-134    Ratings of Perceived Exertion  11-13    Perceived Dyspnea  0-4      Progression   Progression  Continue to progress workloads to maintain intensity without signs/symptoms of physical distress.      Resistance Training   Training Prescription  Yes    Weight  4lbs    Reps  10-15       Perform Capillary Blood Glucose checks as needed.  Exercise Prescription Changes:  Exercise Comments:   Exercise Goals and Review: Exercise Goals    Row Name 09/25/17 1154             Exercise Goals   Increase Physical Activity  Yes       Intervention  Provide advice,  education, support and counseling about physical activity/exercise needs.;Develop an individualized exercise prescription for aerobic and resistive training based on initial evaluation findings, risk stratification, comorbidities and participant's personal goals.       Expected Outcomes  Short Term: Attend rehab on a regular basis to increase amount of physical activity.;Long Term: Exercising regularly at least 3-5 days a week.;Long Term: Add in home exercise to make exercise part of routine and to increase amount of physical activity.       Increase Strength and Stamina  Yes short: be able to walk 5-7,000 steps/day  long: return to playing tennis for 1 hour and be able to walk 10,000 steps per day       Intervention  Provide advice, education, support and counseling about physical activity/exercise needs.;Develop an individualized exercise prescription for aerobic and resistive training based on initial evaluation findings, risk stratification, comorbidities and participant's personal goals.       Expected Outcomes  Short Term: Increase workloads from initial exercise prescription for resistance, speed, and METs.;Short Term: Perform resistance training exercises routinely during rehab and add in resistance training at home;Long Term: Improve cardiorespiratory fitness, muscular endurance and strength as measured by increased METs and functional capacity (6MWT)       Able to understand and use rate of perceived exertion (RPE) scale  Yes       Intervention  Provide education and explanation on how to use RPE scale       Expected Outcomes  Short Term: Able to use RPE daily in rehab to express subjective intensity level;Long Term:  Able to use RPE to guide intensity level when exercising independently       Knowledge and understanding of Target Heart Rate Range (THRR)  Yes       Intervention  Provide education and explanation of THRR including how the numbers were predicted and where they are located for  reference       Expected Outcomes  Short Term: Able to state/look up THRR;Long Term: Able to use THRR to govern intensity when exercising independently;Short Term: Able to use daily as guideline for intensity in rehab       Able to check pulse independently  Yes       Intervention  Provide education and demonstration on how to check pulse in carotid and radial arteries.;Review the importance of being able to check your own pulse for safety during independent exercise       Expected Outcomes  Short Term: Able to explain why pulse checking is important during independent exercise;Long Term: Able to check pulse independently and accurately       Understanding of Exercise Prescription  Yes       Intervention  Provide education, explanation, and written materials on patient's individual exercise prescription       Expected Outcomes  Short Term: Able to explain program exercise prescription;Long Term: Able to explain home exercise prescription to exercise independently          Exercise Goals Re-Evaluation :   Discharge Exercise Prescription (Final Exercise Prescription Changes):   Nutrition:  Target Goals: Understanding of nutrition guidelines, daily intake of sodium 1500mg , cholesterol 200mg , calories 30% from fat and 7% or less from  saturated fats, daily to have 5 or more servings of fruits and vegetables.  Biometrics: Pre Biometrics - 09/25/17 1155      Pre Biometrics   Height  5' 6.25" (1.683 m)    Weight  79.8 kg    Waist Circumference  36 inches    Hip Circumference  43 inches    Waist to Hip Ratio  0.84 %    BMI (Calculated)  28.17    Triceps Skinfold  33 mm    % Body Fat  38.9 %    Grip Strength  39 kg    Flexibility  10 in    Single Leg Stand  10 seconds        Nutrition Therapy Plan and Nutrition Goals: Nutrition Therapy & Goals - 09/25/17 0957      Nutrition Therapy   Diet  heart healthy      Personal Nutrition Goals   Nutrition Goal  Pt to identify and limit  food sources of saturated fat, trans fat, refined carbohydrates and sodium    Personal Goal #2  Pt to identify food quantities necessary to achieve weight loss of 6-24 lbs. at graduation from cardiac rehab. Goal wt loss of 15-20 lb desired.       Intervention Plan   Intervention  Prescribe, educate and counsel regarding individualized specific dietary modifications aiming towards targeted core components such as weight, hypertension, lipid management, diabetes, heart failure and other comorbidities.    Expected Outcomes  Short Term Goal: Understand basic principles of dietary content, such as calories, fat, sodium, cholesterol and nutrients.       Nutrition Assessments: Nutrition Assessments - 09/25/17 0957      MEDFICTS Scores   Pre Score  50       Nutrition Goals Re-Evaluation:   Nutrition Goals Re-Evaluation:   Nutrition Goals Discharge (Final Nutrition Goals Re-Evaluation):   Psychosocial: Target Goals: Acknowledge presence or absence of significant depression and/or stress, maximize coping skills, provide positive support system. Participant is able to verbalize types and ability to use techniques and skills needed for reducing stress and depression.  Initial Review & Psychosocial Screening: Initial Psych Review & Screening - 09/25/17 1238      Initial Review   Current issues with  Current Stress Concerns;Current Anxiety/Panic    Source of Stress Concerns  Family    Comments  Pt reports feeling "crazy about my symptoms" as she feels that she is young and doesn't smoke.  Her mother passed away recently and her children hae moved away to college.       Family Dynamics   Good Support System?  Yes   Pt reports her husband, children, family, and friends as sources of support.      Barriers   Psychosocial barriers to participate in program  The patient should benefit from training in stress management and relaxation.      Screening Interventions   Interventions  Encouraged  to exercise;Provide feedback about the scores to participant;To provide support and resources with identified psychosocial needs    Expected Outcomes  Short Term goal: Identification and review with participant of any Quality of Life or Depression concerns found by scoring the questionnaire.;Long Term goal: The participant improves quality of Life and PHQ9 Scores as seen by post scores and/or verbalization of changes       Quality of Life Scores: Quality of Life - 09/25/17 1212      Quality of Life   Select  Quality of Life  Quality of Life Scores   Health/Function Pre  16.77 %    Socioeconomic Pre  21.67 %    Psych/Spiritual Pre  19.57 %    Family Pre  22.8 %    GLOBAL Pre  19.17 %      Scores of 19 and below usually indicate a poorer quality of life in these areas.  A difference of  2-3 points is a clinically meaningful difference.  A difference of 2-3 points in the total score of the Quality of Life Index has been associated with significant improvement in overall quality of life, self-image, physical symptoms, and general health in studies assessing change in quality of life.  PHQ-9: Recent Review Flowsheet Data    There is no flowsheet data to display.     Interpretation of Total Score  Total Score Depression Severity:  1-4 = Minimal depression, 5-9 = Mild depression, 10-14 = Moderate depression, 15-19 = Moderately severe depression, 20-27 = Severe depression   Psychosocial Evaluation and Intervention:   Psychosocial Re-Evaluation:   Psychosocial Discharge (Final Psychosocial Re-Evaluation):   Vocational Rehabilitation: Provide vocational rehab assistance to qualifying candidates.   Vocational Rehab Evaluation & Intervention: Vocational Rehab - 09/25/17 1242      Initial Vocational Rehab Evaluation & Intervention   Assessment shows need for Vocational Rehabilitation  No       Education: Education Goals: Education classes will be provided on a weekly basis,  covering required topics. Participant will state understanding/return demonstration of topics presented.  Learning Barriers/Preferences: Learning Barriers/Preferences - 09/25/17 1209      Learning Barriers/Preferences   Learning Barriers  None    Learning Preferences  Skilled Demonstration;Individual Instruction;Pictoral;Video;Written Material       Education Topics: Count Your Pulse:  -Group instruction provided by verbal instruction, demonstration, patient participation and written materials to support subject.  Instructors address importance of being able to find your pulse and how to count your pulse when at home without a heart monitor.  Patients get hands on experience counting their pulse with staff help and individually.   Heart Attack, Angina, and Risk Factor Modification:  -Group instruction provided by verbal instruction, video, and written materials to support subject.  Instructors address signs and symptoms of angina and heart attacks.    Also discuss risk factors for heart disease and how to make changes to improve heart health risk factors.   Functional Fitness:  -Group instruction provided by verbal instruction, demonstration, patient participation, and written materials to support subject.  Instructors address safety measures for doing things around the house.  Discuss how to get up and down off the floor, how to pick things up properly, how to safely get out of a chair without assistance, and balance training.   Meditation and Mindfulness:  -Group instruction provided by verbal instruction, patient participation, and written materials to support subject.  Instructor addresses importance of mindfulness and meditation practice to help reduce stress and improve awareness.  Instructor also leads participants through a meditation exercise.    Stretching for Flexibility and Mobility:  -Group instruction provided by verbal instruction, patient participation, and written  materials to support subject.  Instructors lead participants through series of stretches that are designed to increase flexibility thus improving mobility.  These stretches are additional exercise for major muscle groups that are typically performed during regular warm up and cool down.   Hands Only CPR:  -Group verbal, video, and participation provides a basic overview of AHA guidelines for community CPR. Role-play of  emergencies allow participants the opportunity to practice calling for help and chest compression technique with discussion of AED use.   Hypertension: -Group verbal and written instruction that provides a basic overview of hypertension including the most recent diagnostic guidelines, risk factor reduction with self-care instructions and medication management.    Nutrition I class: Heart Healthy Eating:  -Group instruction provided by PowerPoint slides, verbal discussion, and written materials to support subject matter. The instructor gives an explanation and review of the Therapeutic Lifestyle Changes diet recommendations, which includes a discussion on lipid goals, dietary fat, sodium, fiber, plant stanol/sterol esters, sugar, and the components of a well-balanced, healthy diet.   Nutrition II class: Lifestyle Skills:  -Group instruction provided by PowerPoint slides, verbal discussion, and written materials to support subject matter. The instructor gives an explanation and review of label reading, grocery shopping for heart health, heart healthy recipe modifications, and ways to make healthier choices when eating out.   Diabetes Question & Answer:  -Group instruction provided by PowerPoint slides, verbal discussion, and written materials to support subject matter. The instructor gives an explanation and review of diabetes co-morbidities, pre- and post-prandial blood glucose goals, pre-exercise blood glucose goals, signs, symptoms, and treatment of hypoglycemia and  hyperglycemia, and foot care basics.   Diabetes Blitz:  -Group instruction provided by PowerPoint slides, verbal discussion, and written materials to support subject matter. The instructor gives an explanation and review of the physiology behind type 1 and type 2 diabetes, diabetes medications and rational behind using different medications, pre- and post-prandial blood glucose recommendations and Hemoglobin A1c goals, diabetes diet, and exercise including blood glucose guidelines for exercising safely.    Portion Distortion:  -Group instruction provided by PowerPoint slides, verbal discussion, written materials, and food models to support subject matter. The instructor gives an explanation of serving size versus portion size, changes in portions sizes over the last 20 years, and what consists of a serving from each food group.   Stress Management:  -Group instruction provided by verbal instruction, video, and written materials to support subject matter.  Instructors review role of stress in heart disease and how to cope with stress positively.     Exercising on Your Own:  -Group instruction provided by verbal instruction, power point, and written materials to support subject.  Instructors discuss benefits of exercise, components of exercise, frequency and intensity of exercise, and end points for exercise.  Also discuss use of nitroglycerin and activating EMS.  Review options of places to exercise outside of rehab.  Review guidelines for sex with heart disease.   Cardiac Drugs I:  -Group instruction provided by verbal instruction and written materials to support subject.  Instructor reviews cardiac drug classes: antiplatelets, anticoagulants, beta blockers, and statins.  Instructor discusses reasons, side effects, and lifestyle considerations for each drug class.   Cardiac Drugs II:  -Group instruction provided by verbal instruction and written materials to support subject.  Instructor  reviews cardiac drug classes: angiotensin converting enzyme inhibitors (ACE-I), angiotensin II receptor blockers (ARBs), nitrates, and calcium channel blockers.  Instructor discusses reasons, side effects, and lifestyle considerations for each drug class.   Anatomy and Physiology of the Circulatory System:  Group verbal and written instruction and models provide basic cardiac anatomy and physiology, with the coronary electrical and arterial systems. Review of: AMI, Angina, Valve disease, Heart Failure, Peripheral Artery Disease, Cardiac Arrhythmia, Pacemakers, and the ICD.   Other Education:  -Group or individual verbal, written, or video instructions that support the educational  goals of the cardiac rehab program.   Holiday Eating Survival Tips:  -Group instruction provided by PowerPoint slides, verbal discussion, and written materials to support subject matter. The instructor gives patients tips, tricks, and techniques to help them not only survive but enjoy the holidays despite the onslaught of food that accompanies the holidays.   Knowledge Questionnaire Score: Knowledge Questionnaire Score - 09/25/17 1216      Knowledge Questionnaire Score   Pre Score  19/24       Core Components/Risk Factors/Patient Goals at Admission: Personal Goals and Risk Factors at Admission - 09/25/17 1214      Core Components/Risk Factors/Patient Goals on Admission   Stress  Yes    Intervention  Offer individual and/or small group education and counseling on adjustment to heart disease, stress management and health-related lifestyle change. Teach and support self-help strategies.;Refer participants experiencing significant psychosocial distress to appropriate mental health specialists for further evaluation and treatment. When possible, include family members and significant others in education/counseling sessions.    Expected Outcomes  Short Term: Participant demonstrates changes in health-related behavior,  relaxation and other stress management skills, ability to obtain effective social support, and compliance with psychotropic medications if prescribed.;Long Term: Emotional wellbeing is indicated by absence of clinically significant psychosocial distress or social isolation.       Core Components/Risk Factors/Patient Goals Review:    Core Components/Risk Factors/Patient Goals at Discharge (Final Review):    ITP Comments: ITP Comments    Row Name 09/25/17 1152           ITP Comments  Dr. Fransico Him, Medical Director          Comments: Patient attended orientation from [id number] to 1019 to review rules and guidelines for program. Completed 6 minute walk test, Intitial ITP, and exercise prescription.  VSS. Telemetry-SR.  Asymptomatic.

## 2017-09-25 NOTE — Progress Notes (Signed)
Cardiac Rehab Medication Review by a RN  Does the patient feel that his/her medications are working for him/her?  yes  Has the patient been experiencing any side effects to the medications prescribed?  No  Does the patient measure his/her own blood pressure or blood glucose at home?  Yes; Nancy Sparks does check her BP at home.  Had an increased reading yesterday evening about "160/112" prior to her evening dose of metoprolol and with increased stress.  She is helping her daughter plan a wedding.   Does the patient have any problems obtaining medications due to transportation or finances?   no  Understanding of regimen: good Understanding of indications: good Potential of compliance: good    RN comments: We discussed the effects of stress on blood pressure.  Nancy Sparks describes her blood pressure within normal limits, though no specific numbers given, when checking it at other times.  Discussed need to continue to check BP taking note of stress levels and timing of medications.     Nancy Sparks 09/25/2017 8:42 AM

## 2017-09-26 ENCOUNTER — Ambulatory Visit: Payer: BLUE CROSS/BLUE SHIELD | Admitting: Physician Assistant

## 2017-10-01 ENCOUNTER — Encounter (HOSPITAL_COMMUNITY): Payer: BLUE CROSS/BLUE SHIELD

## 2017-10-01 ENCOUNTER — Encounter: Payer: Self-pay | Admitting: Physician Assistant

## 2017-10-01 ENCOUNTER — Encounter (HOSPITAL_COMMUNITY)
Admission: RE | Admit: 2017-10-01 | Discharge: 2017-10-01 | Disposition: A | Discharge status: Home / Self Care | Payer: BLUE CROSS/BLUE SHIELD | Source: Ambulatory Visit | Attending: Cardiovascular Disease | Admitting: Cardiovascular Disease

## 2017-10-01 ENCOUNTER — Encounter (HOSPITAL_COMMUNITY): Payer: Self-pay

## 2017-10-01 ENCOUNTER — Ambulatory Visit: Payer: BLUE CROSS/BLUE SHIELD | Admitting: Physician Assistant

## 2017-10-01 VITALS — BP 122/88 | HR 64 | Ht 66.25 in | Wt 180.2 lb

## 2017-10-01 DIAGNOSIS — I2510 Atherosclerotic heart disease of native coronary artery without angina pectoris: Secondary | ICD-10-CM | POA: Diagnosis not present

## 2017-10-01 DIAGNOSIS — Z7982 Long term (current) use of aspirin: Secondary | ICD-10-CM | POA: Diagnosis not present

## 2017-10-01 DIAGNOSIS — F419 Anxiety disorder, unspecified: Secondary | ICD-10-CM | POA: Diagnosis not present

## 2017-10-01 DIAGNOSIS — I493 Ventricular premature depolarization: Secondary | ICD-10-CM | POA: Diagnosis not present

## 2017-10-01 DIAGNOSIS — I25119 Atherosclerotic heart disease of native coronary artery with unspecified angina pectoris: Secondary | ICD-10-CM

## 2017-10-01 DIAGNOSIS — E7801 Familial hypercholesterolemia: Secondary | ICD-10-CM

## 2017-10-01 DIAGNOSIS — I10 Essential (primary) hypertension: Secondary | ICD-10-CM

## 2017-10-01 DIAGNOSIS — Z7902 Long term (current) use of antithrombotics/antiplatelets: Secondary | ICD-10-CM | POA: Diagnosis not present

## 2017-10-01 DIAGNOSIS — Z86010 Personal history of colonic polyps: Secondary | ICD-10-CM | POA: Diagnosis not present

## 2017-10-01 DIAGNOSIS — Z87891 Personal history of nicotine dependence: Secondary | ICD-10-CM | POA: Diagnosis not present

## 2017-10-01 DIAGNOSIS — Z955 Presence of coronary angioplasty implant and graft: Secondary | ICD-10-CM | POA: Diagnosis not present

## 2017-10-01 DIAGNOSIS — K219 Gastro-esophageal reflux disease without esophagitis: Secondary | ICD-10-CM | POA: Diagnosis not present

## 2017-10-01 DIAGNOSIS — F329 Major depressive disorder, single episode, unspecified: Secondary | ICD-10-CM | POA: Diagnosis not present

## 2017-10-01 DIAGNOSIS — Z79899 Other long term (current) drug therapy: Secondary | ICD-10-CM | POA: Diagnosis not present

## 2017-10-01 DIAGNOSIS — E785 Hyperlipidemia, unspecified: Secondary | ICD-10-CM | POA: Diagnosis not present

## 2017-10-01 NOTE — Progress Notes (Signed)
Daily Session Note  Patient Details  Name: Nancy Sparks MRN: [id number] Date of Birth: 1965/03/21 Referring Provider:     CARDIAC REHAB PHASE II ORIENTATION from 09/25/2017 in [facility]  Referring Provider  Mertie Moores MD      Encounter Date: 10/01/2017  Check In: Session Check In - 10/01/17 1023      Check-In   Supervising physician immediately available to respond to emergencies  [facility] immediately available    Physician(s)  Dr. Karleen Hampshire    Location  [facility]    Staff Present  Jiles Garter, RN, Deland Pretty, MS, ACSM CEP, Exercise Physiologist;Joann Rion, RN, Mosie Epstein, MS,ACSM CEP, Exercise Physiologist    Medication changes reported      No    Fall or balance concerns reported     No    Tobacco Cessation  No Change    Warm-up and Cool-down  Performed as group-led instruction    Resistance Training Performed  No    VAD Patient?  No    PAD/SET Patient?  No      Pain Assessment   Currently in Pain?  No/denies    Multiple Pain Sites  No       Capillary Blood Glucose: No results found for this or any previous visit (from the past 24 hour(s)).  Exercise Prescription Changes - 10/01/17 0951      Response to Exercise   Blood Pressure (Admit)  138/82    Blood Pressure (Exercise)  138/80    Blood Pressure (Exit)  128/70    Heart Rate (Admit)  71 bpm    Heart Rate (Exercise)  99 bpm    Heart Rate (Exit)  71 bpm    Rating of Perceived Exertion (Exercise)  11    Symptoms  none    Duration  Progress to 30 minutes of  aerobic without signs/symptoms of physical distress    Intensity  THRR unchanged      Progression   Progression  Continue to progress workloads to maintain intensity without signs/symptoms of physical distress.    Average METs  4.4      Resistance Training   Training Prescription  No   Relaxation day, no weights.     Interval Training   Interval Training  No      Treadmill    MPH  3.4    Grade  0    Minutes  10    METs  3.6      NuStep   Level  4    SPM  80    Minutes  10      Rower   Level  3    Watts  38    Minutes  10    METs  5.1       Social History   Tobacco Use  Smoking Status Former Smoker  . Packs/day: 0.10  . Years: 2.00  . Pack years: 0.20  . Types: Cigarettes  Smokeless Tobacco Never Used  Tobacco Comment   quit smoking in college    Goals Met:  Exercise tolerated well  Goals Unmet:  Not Applicable  Comments: Pt started cardiac rehab today.  Pt tolerated light exercise without difficulty. VSS, telemetry-SR, asymptomatic.  Medication list reconciled. Pt denies barriers to medicaiton compliance.  PSYCHOSOCIAL ASSESSMENT:  PHQ-0. Pt exhibits positive coping skills, hopeful outlook with supportive family. No psychosocial needs identified at this time, no psychosocial interventions necessary.  Pt oriented to  exercise equipment and routine.    Understanding verbalized.    Dr. Fransico Him is Medical Director for Cardiac Rehab at [facility].

## 2017-10-01 NOTE — Progress Notes (Signed)
Cardiology Office Note:    Date:  10/01/2017   ID:  Nancy Sparks, DOB December 14, 1965, MRN [id number]  PCP:  Haywood Pao, MD  Cardiologist:  Sanda Klein, MD   Referring MD: Haywood Pao, MD   Chief Complaint  Patient presents with  . Hospitalization Follow-up    Status post heart catheterization    History of Present Illness:    Nancy Sparks is a 52 y.o. female with coronary artery disease, hyperlipidemia, family history of coronary disease.  She was admitted in June 2019 with unstable angina.  Cardiac catheterization demonstrated 75% stenosis in ostial first diagonal and 90% stenosis in the mid RCA.  The RCA was treated with a drug-eluting stent.  Medical therapy was recommended for the diagonal stenosis.  The patient did continue to have anginal symptoms after PCI.  Medical therapy was adjusted.  She ultimately underwent repeat cardiac catheterization 09/04/2017.  This demonstrated a widely patent RCA stent.  The disease in her ostial diagonal was felt to be mild.  There was a question of vasospasm.  Therefore medical therapy was continued.  She has been started on Repatha (evolocumab) for hyperlipidemia.  [name] returns for follow-up.  She is here alone.  She does left cardiac rehabilitation.  Overall, she is doing well.  She has occasional chest symptoms related to PVCs.  She denies exertional chest symptoms or significant shortness of breath.  She does still get short of breath with some activities.  She denies PND, syncope or lower extremity swelling.  She denies any bleeding issues.  Prior CV studies:   The following studies were reviewed today:  Cardiac catheterization 09/04/2017 LAD irregularities, D1 ostial 40 LCx irregularities RCA proximal stent patent 1. Widely patent RCA stent with no evidence of restenosis 2. Widely patent coronaries with mild ostial diagonal stenosis (small vessel) 3. Normal LVEDP The overall appearance of the coronary arteries are  improved compared to the previous study, suggesting a component of vasospasm. Would continue current medical therapy and follow-up as planned.    Cardiac catheterization 07/28/2017 LAD proximal 20; D1 ostial 75 RCA proximal 20, mid 90 PCI: 3 x 18 mm resolute Onyx DES to the RCA  Echocardiogram 07/25/2017 EF 55-60, normal wall motion, normal diastolic function  Past Medical History:  Diagnosis Date  . Anemia    "w/pregnancies  . Anxiety    hx  . CAD (coronary artery disease)    LHC 6/19: pLAD, oD1 75, pRCA 20, mRCA 90 >> PCI: DES to RCA // Echo 6/19: EF 55-60, normal wall motion  . Complication of anesthesia    itching relieved with benadryl w/all my surgeries (07/24/2017)  . Depression    hx  . Dyslipidemia   . Fibroid   . GERD (gastroesophageal reflux disease)   . Heart murmur    hx  . Heart palpitations    hx of  . Hx of adenomatous polyp of colon 12/24/2002  . Hypertension    Surgical Hx: The patient  has a past surgical history that includes Cesarean section (1996; 1998; 2001); Novasure ablation (2006); Colonoscopy; Lumbar laminectomy/decompression microdiscectomy (04/25/2011); Lumbar laminectomy/decompression microdiscectomy (04/26/2011); Back surgery; Tubal ligation (2001); LEFT HEART CATH AND CORONARY ANGIOGRAPHY (N/A, 07/28/2017); CORONARY STENT INTERVENTION (N/A, 07/28/2017); and LEFT HEART CATH AND CORONARY ANGIOGRAPHY (N/A, 09/04/2017).   Current Medications: Current Meds  Medication Sig  . ALPRAZolam (XANAX) 0.25 MG tablet Take 1 tablet (0.25 mg total) by mouth daily as needed for anxiety.  Marland Kitchen amLODipine (NORVASC) 2.5  MG tablet Take 1 tablet (2.5 mg total) by mouth daily.  Marland Kitchen aspirin EC 81 MG EC tablet Take 1 tablet (81 mg total) by mouth daily.  . clopidogrel (PLAVIX) 75 MG tablet Take 1 tablet (75 mg total) by mouth daily.  . Evolocumab (REPATHA SURECLICK) 956 MG/ML SOAJ Inject 1 pen into the skin every 14 (fourteen) days.  Marland Kitchen ibuprofen (ADVIL,MOTRIN) 200 MG tablet Take  600-800 mg by mouth every 6 (six) hours as needed (pain).   . isosorbide mononitrate (IMDUR) 30 MG 24 hr tablet Take 1 tablet (30 mg total) by mouth daily.  . metoprolol tartrate (LOPRESSOR) 50 MG tablet Take 1 tablet (50 mg total) by mouth 2 (two) times daily.  . nitroGLYCERIN (NITROSTAT) 0.4 MG SL tablet Place 1 tablet (0.4 mg total) under the tongue every 5 (five) minutes as needed for chest pain.  . rosuvastatin (CRESTOR) 40 MG tablet Take 1 tablet (40 mg total) by mouth at bedtime.  . Tetrahydrozoline HCl (VISINE OP) Place 1 drop into both eyes daily as needed (dry eyes).  . trazodone (DESYREL) 300 MG tablet Take 300 mg by mouth as directed.     Allergies:   Latex and Tape   Social History   Tobacco Use  . Smoking status: Former Smoker    Packs/day: 0.10    Years: 2.00    Pack years: 0.20    Types: Cigarettes  . Smokeless tobacco: Never Used  . Tobacco comment: quit smoking in college  Substance Use Topics  . Alcohol use: Yes    Alcohol/week: 6.0 standard drinks    Types: 6 Glasses of wine per week  . Drug use: Not Currently    Types: Marijuana    Comment: 07/24/2017 "in college"     Family Hx: The patient's family history includes Anesthesia problems in her mother and sister; Atrial fibrillation in her father; Cancer in her sister; Colon cancer (age of onset: 65) in her sister; Coronary artery disease in her father; Diabetes in her father; Heart disease in her father; Hyperlipidemia in her brother, father, mother, and sister; Hypertension in her father and mother; Thyroid disease in her sister.  ROS:   Please see the history of present illness.    Review of Systems  Constitution: Positive for night sweats.  Cardiovascular: Positive for chest pain and palpitations.  Psychiatric/Behavioral: The patient is nervous/anxious.    All other systems reviewed and are negative.   EKGs/Labs/Other Test Reviewed:    EKG:  EKG is  ordered today.  The ekg ordered today demonstrates  NSR, HR 63, normal axis, QTc 431, PVC, similar to old EKG.  Recent Labs: 03/18/2017: TSH 1.97 07/24/2017: ALT 23 09/03/2017: BUN 20; Creatinine, Ser 0.79; Hemoglobin 13.1; Platelets 178; Potassium 4.0; Sodium 141   Recent Lipid Panel Lab Results  Component Value Date/Time   CHOL 263 (H) 07/25/2017 10:51 AM   TRIG 58 07/25/2017 10:51 AM   HDL 78 07/25/2017 10:51 AM   CHOLHDL 3.4 07/25/2017 10:51 AM   LDLCALC 173 (H) 07/25/2017 10:51 AM   LDLDIRECT 115 (H) 09/03/2017 12:09 PM   LDLDIRECT 155.5 05/14/2011 09:24 AM    Physical Exam:    VS:  BP 122/88   Pulse 64   Ht 5' 6.25" (1.683 m)   Wt 180 lb 4 oz (81.8 kg)   SpO2 98%   BMI 28.87 kg/m     Wt Readings from Last 3 Encounters:  10/01/17 180 lb 4 oz (81.8 kg)  09/25/17 175 lb 14.8  oz (79.8 kg)  09/04/17 175 lb (79.4 kg)     Physical Exam  Constitutional: She is oriented to person, place, and time. She appears well-developed and well-nourished. No distress.  HENT:  Head: Normocephalic and atraumatic.  Eyes: No scleral icterus.  Neck: No JVD present. No thyromegaly present.  Cardiovascular: Normal rate and regular rhythm.  No murmur heard. Pulmonary/Chest: Effort normal. She has no rales.  Abdominal: Soft. There is no tenderness.  Musculoskeletal: She exhibits no edema.  Lymphadenopathy:    She has no cervical adenopathy.  Neurological: She is alert and oriented to person, place, and time.  Skin: Skin is warm and dry.  Psychiatric: She has a normal mood and affect.    ASSESSMENT & PLAN:    Coronary artery disease involving native coronary artery of native heart with angina pectoris ([place]) Status post drug-eluting stent to the RCA in June 2019.  Relook cardiac catheterization performed 09/04/2017 due to recurrent symptoms.  This demonstrated patent RCA stent and mild nonobstructive disease in the first diagonal.  She has been continued on amlodipine for possible vasospasm.  She is currently doing well without significant  symptoms.  She seems to have more symptomatic PVCs and anything.  She just started cardiac rehabilitation.  She feels much better on clopidogrel rather than ticagrelor.  -Continue aspirin, clopidogrel, evolocumab, rosuvastatin, metoprolol, isosorbide  Familial hypercholesterolemia Continue current dose of rosuvastatin and evolocumab.  Follow-up labs will be obtained through the lipid clinic.  Essential hypertension The patient's blood pressure is controlled on her current regimen.  Continue current therapy.   PVC's (premature ventricular contractions) She has frequent symptomatic PVCs.  We discussed reducing caffeine.  She will discuss obtaining alprazolam with her PCP.  I also recommend that she can take an extra metoprolol tartrate 25 mg as needed for frequent PVCs.  If her PVCs become more frequent, we can consider a 24-hour Holter to assess PVC burden.   Dispo:  Return in about 29 days (around 10/30/2017) for Scheduled Follow Up w/ Dr. Sallyanne Kuster.   Medication Adjustments/Labs and Tests Ordered: Current medicines are reviewed at length with the patient today.  Concerns regarding medicines are outlined above.  Tests Ordered: Orders Placed This Encounter  Procedures  . EKG 12-Lead   Medication Changes: No orders of the defined types were placed in this encounter.   Signed, Richardson Dopp, PA-C  10/01/2017 12:49 PM    [facility] [address], [place], [place]  [address] Phone: [phone]; Fax: [phone]

## 2017-10-01 NOTE — Patient Instructions (Addendum)
Medication Instructions:  Your physician recommends that you continue on your current medications as directed. Please refer to the Current Medication list given to you today.   Labwork: NONE ORDERED TODAY  Testing/Procedures: NONE ORDERED TODAY  Follow-Up: KEEP YOUR APPT WITH DR. Sallyanne Kuster ON 10/30/17 @ 9:40 AM   Any Other Special Instructions Will Be Listed Below (If Applicable).     If you need a refill on your cardiac medications before your next appointment, please call your pharmacy.

## 2017-10-01 NOTE — Progress Notes (Signed)
I am glad she is feeling better.  Thanks [employer]

## 2017-10-03 ENCOUNTER — Encounter (HOSPITAL_COMMUNITY): Payer: BLUE CROSS/BLUE SHIELD

## 2017-10-03 ENCOUNTER — Encounter (HOSPITAL_COMMUNITY)
Admission: RE | Admit: 2017-10-03 | Discharge: 2017-10-03 | Disposition: A | Discharge status: Home / Self Care | Payer: BLUE CROSS/BLUE SHIELD | Source: Ambulatory Visit | Attending: Cardiovascular Disease | Admitting: Cardiovascular Disease

## 2017-10-03 DIAGNOSIS — E785 Hyperlipidemia, unspecified: Secondary | ICD-10-CM | POA: Diagnosis not present

## 2017-10-03 DIAGNOSIS — K219 Gastro-esophageal reflux disease without esophagitis: Secondary | ICD-10-CM | POA: Diagnosis not present

## 2017-10-03 DIAGNOSIS — Z7982 Long term (current) use of aspirin: Secondary | ICD-10-CM | POA: Diagnosis not present

## 2017-10-03 DIAGNOSIS — Z7902 Long term (current) use of antithrombotics/antiplatelets: Secondary | ICD-10-CM | POA: Diagnosis not present

## 2017-10-03 DIAGNOSIS — Z955 Presence of coronary angioplasty implant and graft: Secondary | ICD-10-CM | POA: Diagnosis not present

## 2017-10-03 DIAGNOSIS — I2510 Atherosclerotic heart disease of native coronary artery without angina pectoris: Secondary | ICD-10-CM | POA: Diagnosis not present

## 2017-10-03 DIAGNOSIS — Z86010 Personal history of colonic polyps: Secondary | ICD-10-CM | POA: Diagnosis not present

## 2017-10-03 DIAGNOSIS — I10 Essential (primary) hypertension: Secondary | ICD-10-CM | POA: Diagnosis not present

## 2017-10-03 DIAGNOSIS — F329 Major depressive disorder, single episode, unspecified: Secondary | ICD-10-CM | POA: Diagnosis not present

## 2017-10-03 DIAGNOSIS — F419 Anxiety disorder, unspecified: Secondary | ICD-10-CM | POA: Diagnosis not present

## 2017-10-03 DIAGNOSIS — Z79899 Other long term (current) drug therapy: Secondary | ICD-10-CM | POA: Diagnosis not present

## 2017-10-03 DIAGNOSIS — Z87891 Personal history of nicotine dependence: Secondary | ICD-10-CM | POA: Diagnosis not present

## 2017-10-08 ENCOUNTER — Encounter (HOSPITAL_COMMUNITY)
Admission: RE | Admit: 2017-10-08 | Discharge: 2017-10-08 | Disposition: A | Discharge status: Home / Self Care | Payer: BLUE CROSS/BLUE SHIELD | Source: Ambulatory Visit | Attending: Cardiovascular Disease | Admitting: Cardiovascular Disease

## 2017-10-08 ENCOUNTER — Encounter (HOSPITAL_COMMUNITY): Payer: BLUE CROSS/BLUE SHIELD

## 2017-10-08 DIAGNOSIS — E785 Hyperlipidemia, unspecified: Secondary | ICD-10-CM | POA: Insufficient documentation

## 2017-10-08 DIAGNOSIS — I2510 Atherosclerotic heart disease of native coronary artery without angina pectoris: Secondary | ICD-10-CM | POA: Insufficient documentation

## 2017-10-08 DIAGNOSIS — Z79899 Other long term (current) drug therapy: Secondary | ICD-10-CM | POA: Insufficient documentation

## 2017-10-08 DIAGNOSIS — Z87891 Personal history of nicotine dependence: Secondary | ICD-10-CM | POA: Insufficient documentation

## 2017-10-08 DIAGNOSIS — F419 Anxiety disorder, unspecified: Secondary | ICD-10-CM | POA: Insufficient documentation

## 2017-10-08 DIAGNOSIS — Z7902 Long term (current) use of antithrombotics/antiplatelets: Secondary | ICD-10-CM | POA: Diagnosis not present

## 2017-10-08 DIAGNOSIS — Z955 Presence of coronary angioplasty implant and graft: Secondary | ICD-10-CM | POA: Insufficient documentation

## 2017-10-08 DIAGNOSIS — F329 Major depressive disorder, single episode, unspecified: Secondary | ICD-10-CM | POA: Insufficient documentation

## 2017-10-08 DIAGNOSIS — I10 Essential (primary) hypertension: Secondary | ICD-10-CM | POA: Diagnosis not present

## 2017-10-08 DIAGNOSIS — K219 Gastro-esophageal reflux disease without esophagitis: Secondary | ICD-10-CM | POA: Diagnosis not present

## 2017-10-08 DIAGNOSIS — Z7982 Long term (current) use of aspirin: Secondary | ICD-10-CM | POA: Insufficient documentation

## 2017-10-08 DIAGNOSIS — Z86010 Personal history of colonic polyps: Secondary | ICD-10-CM | POA: Insufficient documentation

## 2017-10-08 NOTE — Progress Notes (Signed)
Nancy Sparks 52 y.o. female Nutrition Note Spoke with pt. Nutrition plan and goals reviewed with pt. Pt is following heart healthy diet. Pt wants to lose wt. Pt has not actively been trying to lose wt. Wt loss tips reviewed (label reading, how to build a healthy plate, portion sizes, eating frequently across the day). 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 Set goal with patient to focus on portion sizes at meals and leveraging non-starchy vegetables and a portion of lean protein to provide satiety. Per discussion, pt does not use canned/convenience foods often. Pt rarely adds salt to food. Pt eats out frequently 3x a week. Discussed tips and tricks to navigate eating out. Pt expressed understanding of the information reviewed. Pt aware of nutrition education classes offered and plans on attending nutrition classes.  No results found for: HGBA1C  Wt Readings from Last 3 Encounters:  10/01/17 180 lb 4 oz (81.8 kg)  09/25/17 175 lb 14.8 oz (79.8 kg)  09/04/17 175 lb (79.4 kg)    Nutrition Diagnosis  Overweight related to excessive energy intake as evidenced by a Body mass index is 28.18 kg/m.   Nutrition Intervention ? Pt's individual nutrition plan reviewed with pt. ? Benefits of adopting Heart Healthy diet discussed when Medficts reviewed  ? Pt given handouts for: ? Nutrition I class ? Nutrition II class ? Diabetes Blitz Class ? Consistent vit K diet ? low sodium ? DM ? pre-diabetes ? Diabetes Q & A class ? Continue client-centered nutrition education by RD, as part of interdisciplinary care.  Goal(s)  Pt to identify and limit food sources of saturated fat, trans fat, refined carbohydrates and sodium  Pt to identify food quantities necessary to achieve weight loss of 6-24 lbs. at graduation from cardiac rehab. Goal wt loss of 15-20 lb desired.    Plan:   Pt to attend nutrition classes ? Nutrition I ? Nutrition II ? Portion Distortion   Will provide client-centered nutrition education as part of interdisciplinary care  Monitor and evaluate progress toward nutrition goal with team.    Laurina Bustle, MS, RD, LDN 10/08/2017 10:22 AM

## 2017-10-09 NOTE — Progress Notes (Signed)
Cardiac Individual Treatment Plan  Patient Details  Name: Nancy Sparks MRN: [id number] Date of Birth: 29-Jun-1965 Referring Provider:     CARDIAC REHAB PHASE II ORIENTATION from 09/25/2017 in [place]  Referring Provider  Mertie Moores MD      Initial Encounter Date:    CARDIAC REHAB PHASE II ORIENTATION from 09/25/2017 in [place]  Date  09/25/17      Visit Diagnosis: 07/28/17 Stented coronary artery  Patient's Home Medications on Admission:  Current Outpatient Medications:  .  ALPRAZolam (XANAX) 0.25 MG tablet, Take 1 tablet (0.25 mg total) by mouth daily as needed for anxiety., Disp: 5 tablet, Rfl: 0 .  amLODipine (NORVASC) 2.5 MG tablet, Take 1 tablet (2.5 mg total) by mouth daily., Disp: 90 tablet, Rfl: 3 .  aspirin EC 81 MG EC tablet, Take 1 tablet (81 mg total) by mouth daily., Disp: 90 tablet, Rfl: 3 .  clopidogrel (PLAVIX) 75 MG tablet, Take 1 tablet (75 mg total) by mouth daily., Disp: 90 tablet, Rfl: 3 .  Evolocumab (REPATHA SURECLICK) 858 MG/ML SOAJ, Inject 1 pen into the skin every 14 (fourteen) days., Disp: 2 pen, Rfl: 11 .  ibuprofen (ADVIL,MOTRIN) 200 MG tablet, Take 600-800 mg by mouth every 6 (six) hours as needed (pain). , Disp: , Rfl:  .  isosorbide mononitrate (IMDUR) 30 MG 24 hr tablet, Take 1 tablet (30 mg total) by mouth daily., Disp: 90 tablet, Rfl: 3 .  metoprolol tartrate (LOPRESSOR) 50 MG tablet, Take 1 tablet (50 mg total) by mouth 2 (two) times daily., Disp: 180 tablet, Rfl: 3 .  nitroGLYCERIN (NITROSTAT) 0.4 MG SL tablet, Place 1 tablet (0.4 mg total) under the tongue every 5 (five) minutes as needed for chest pain., Disp: 90 tablet, Rfl: 3 .  rosuvastatin (CRESTOR) 40 MG tablet, Take 1 tablet (40 mg total) by mouth at bedtime., Disp: 90 tablet, Rfl: 3 .  Tetrahydrozoline HCl (VISINE OP), Place 1 drop into both eyes daily as needed (dry eyes)., Disp: , Rfl:  .  trazodone (DESYREL) 300 MG  tablet, Take 300 mg by mouth as directed., Disp: , Rfl:   Past Medical History: Past Medical History:  Diagnosis Date  . Anemia    "w/pregnancies  . Anxiety    hx  . CAD (coronary artery disease)    LHC 6/19: pLAD, oD1 75, pRCA 20, mRCA 90 >> PCI: DES to RCA // Echo 6/19: EF 55-60, normal wall motion  . Complication of anesthesia    itching relieved with benadryl w/all my surgeries (07/24/2017)  . Depression    hx  . Dyslipidemia   . Fibroid   . GERD (gastroesophageal reflux disease)   . Heart murmur    hx  . Heart palpitations    hx of  . Hx of adenomatous polyp of colon 12/24/2002  . Hypertension     Tobacco Use: Social History   Tobacco Use  Smoking Status Former Smoker  . Packs/day: 0.10  . Years: 2.00  . Pack years: 0.20  . Types: Cigarettes  Smokeless Tobacco Never Used  Tobacco Comment   quit smoking in college    Labs: Recent Review Flowsheet Data    Labs for ITP Cardiac and Pulmonary Rehab Latest Ref Rng & Units 11/05/2013 04/03/2015 07/06/2015 07/25/2017 09/03/2017   Cholestrol 0 - 200 mg/dL 234(H) 250(H) 337(H) 263(H) -   LDLCALC 0 - 99 mg/dL 146(H) 160(H) 217(H) 173(H) -   LDLDIRECT 0 -  99 mg/dL - - - - 115(H)   HDL >40 mg/dL 62 74 97 78 -   Trlycerides <150 mg/dL 131 79 114 58 -      Capillary Blood Glucose: No results found for: GLUCAP   Exercise Target Goals: Exercise Program Goal: Individual exercise prescription set using results from initial 6 min walk test and THRR while considering  patient's activity barriers and safety.   Exercise Prescription Goal: Initial exercise prescription builds to 30-45 minutes a day of aerobic activity, 2-3 days per week.  Home exercise guidelines will be given to patient during program as part of exercise prescription that the participant will acknowledge.  Activity Barriers & Risk Stratification: Activity Barriers & Cardiac Risk Stratification - 09/25/17 1153      Activity Barriers & Cardiac Risk  Stratification   Activity Barriers  Back Problems;Deconditioning;Muscular Weakness    Cardiac Risk Stratification  High       6 Minute Walk: 6 Minute Walk    Row Name 09/25/17 1152         6 Minute Walk   Phase  Initial     Distance  1866 feet     Walk Time  6 minutes     # of Rest Breaks  0     MPH  3.5     METS  4.7     RPE  11     VO2 Peak  16.5     Symptoms  No     Resting HR  66 bpm     Resting BP  118/78     Resting Oxygen Saturation   100 %     Exercise Oxygen Saturation  during 6 min walk  98 %     Max Ex. HR  93 bpm     Max Ex. BP  124/80     2 Minute Post BP  108/72        Oxygen Initial Assessment:   Oxygen Re-Evaluation:   Oxygen Discharge (Final Oxygen Re-Evaluation):   Initial Exercise Prescription: Initial Exercise Prescription - 09/25/17 1100      Date of Initial Exercise RX and Referring Provider   Date  09/25/17    Referring Provider  Nahser, Philip MD      Treadmill   MPH  3    Grade  0    Minutes  10    METs  3.3      NuStep   Level  4    SPM  80    Minutes  10    METs  3      Rower   Level  2    Watts  30    Minutes  10    METs  4.8      Prescription Details   Frequency (times per week)  3    Duration  Progress to 30 minutes of continuous aerobic without signs/symptoms of physical distress      Intensity   THRR 40-80% of Max Heartrate  67-134    Ratings of Perceived Exertion  11-13    Perceived Dyspnea  0-4      Progression   Progression  Continue to progress workloads to maintain intensity without signs/symptoms of physical distress.      Resistance Training   Training Prescription  Yes    Weight  4lbs    Reps  10-15       Perform Capillary Blood Glucose checks as needed.  Exercise Prescription Changes: Exercise Prescription  Changes    Row Name 10/01/17 [phone]             Response to Exercise   Blood Pressure (Admit)  138/82       Blood Pressure (Exercise)  138/80       Blood Pressure (Exit)  128/70        Heart Rate (Admit)  71 bpm       Heart Rate (Exercise)  99 bpm       Heart Rate (Exit)  71 bpm       Rating of Perceived Exertion (Exercise)  11       Symptoms  none       Duration  Progress to 30 minutes of  aerobic without signs/symptoms of physical distress       Intensity  THRR unchanged         Progression   Progression  Continue to progress workloads to maintain intensity without signs/symptoms of physical distress.       Average METs  4.4         Resistance Training   Training Prescription  No Relaxation day, no weights.         Interval Training   Interval Training  No         Treadmill   MPH  3.4       Grade  0       Minutes  10       METs  3.6         NuStep   Level  4       SPM  80       Minutes  10         Rower   Level  3       Watts  38       Minutes  10       METs  5.1          Exercise Comments: Exercise Comments    Row Name 10/01/17 1040           Exercise Comments  Patient tolerated first session of exercise well without c/o.          Exercise Goals and Review: Exercise Goals    Row Name 09/25/17 1154             Exercise Goals   Increase Physical Activity  Yes       Intervention  Provide advice, education, support and counseling about physical activity/exercise needs.;Develop an individualized exercise prescription for aerobic and resistive training based on initial evaluation findings, risk stratification, comorbidities and participant's personal goals.       Expected Outcomes  Short Term: Attend rehab on a regular basis to increase amount of physical activity.;Long Term: Exercising regularly at least 3-5 days a week.;Long Term: Add in home exercise to make exercise part of routine and to increase amount of physical activity.       Increase Strength and Stamina  Yes short: be able to walk 5-7,000 steps/day  long: return to playing tennis for 1 hour and be able to walk 10,000 steps per day       Intervention  Provide advice,  education, support and counseling about physical activity/exercise needs.;Develop an individualized exercise prescription for aerobic and resistive training based on initial evaluation findings, risk stratification, comorbidities and participant's personal goals.       Expected Outcomes  Short Term: Increase workloads from initial exercise prescription for resistance, speed, and METs.;Short Term:  Perform resistance training exercises routinely during rehab and add in resistance training at home;Long Term: Improve cardiorespiratory fitness, muscular endurance and strength as measured by increased METs and functional capacity (6MWT)       Able to understand and use rate of perceived exertion (RPE) scale  Yes       Intervention  Provide education and explanation on how to use RPE scale       Expected Outcomes  Short Term: Able to use RPE daily in rehab to express subjective intensity level;Long Term:  Able to use RPE to guide intensity level when exercising independently       Knowledge and understanding of Target Heart Rate Range (THRR)  Yes       Intervention  Provide education and explanation of THRR including how the numbers were predicted and where they are located for reference       Expected Outcomes  Short Term: Able to state/look up THRR;Long Term: Able to use THRR to govern intensity when exercising independently;Short Term: Able to use daily as guideline for intensity in rehab       Able to check pulse independently  Yes       Intervention  Provide education and demonstration on how to check pulse in carotid and radial arteries.;Review the importance of being able to check your own pulse for safety during independent exercise       Expected Outcomes  Short Term: Able to explain why pulse checking is important during independent exercise;Long Term: Able to check pulse independently and accurately       Understanding of Exercise Prescription  Yes       Intervention  Provide education, explanation,  and written materials on patient's individual exercise prescription       Expected Outcomes  Short Term: Able to explain program exercise prescription;Long Term: Able to explain home exercise prescription to exercise independently          Exercise Goals Re-Evaluation : Exercise Goals Re-Evaluation    Row Name 10/01/17 1224             Exercise Goal Re-Evaluation   Exercise Goals Review  Able to understand and use rate of perceived exertion (RPE) scale       Comments  Patient able to understand and use RPE scale appropriately. Pt is doing some walking at home.       Expected Outcomes  Progress workloads as tolerated.          Discharge Exercise Prescription (Final Exercise Prescription Changes): Exercise Prescription Changes - 10/01/17 0951      Response to Exercise   Blood Pressure (Admit)  138/82    Blood Pressure (Exercise)  138/80    Blood Pressure (Exit)  128/70    Heart Rate (Admit)  71 bpm    Heart Rate (Exercise)  99 bpm    Heart Rate (Exit)  71 bpm    Rating of Perceived Exertion (Exercise)  11    Symptoms  none    Duration  Progress to 30 minutes of  aerobic without signs/symptoms of physical distress    Intensity  THRR unchanged      Progression   Progression  Continue to progress workloads to maintain intensity without signs/symptoms of physical distress.    Average METs  4.4      Resistance Training   Training Prescription  No   Relaxation day, no weights.     Interval Training   Interval Training  No  Treadmill   MPH  3.4    Grade  0    Minutes  10    METs  3.6      NuStep   Level  4    SPM  80    Minutes  10      Rower   Level  3    Watts  38    Minutes  10    METs  5.1       Nutrition:  Target Goals: Understanding of nutrition guidelines, daily intake of sodium 1500mg , cholesterol 200mg , calories 30% from fat and 7% or less from saturated fats, daily to have 5 or more servings of fruits and vegetables.  Biometrics: Pre  Biometrics - 09/25/17 1155      Pre Biometrics   Height  5' 6.25" (1.683 m)    Weight  79.8 kg    Waist Circumference  36 inches    Hip Circumference  43 inches    Waist to Hip Ratio  0.84 %    BMI (Calculated)  28.17    Triceps Skinfold  33 mm    % Body Fat  38.9 %    Grip Strength  39 kg    Flexibility  10 in    Single Leg Stand  10 seconds        Nutrition Therapy Plan and Nutrition Goals: Nutrition Therapy & Goals - 09/25/17 0957      Nutrition Therapy   Diet  heart healthy      Personal Nutrition Goals   Nutrition Goal  Pt to identify and limit food sources of saturated fat, trans fat, refined carbohydrates and sodium    Personal Goal #2  Pt to identify food quantities necessary to achieve weight loss of 6-24 lbs. at graduation from cardiac rehab. Goal wt loss of 15-20 lb desired.       Intervention Plan   Intervention  Prescribe, educate and counsel regarding individualized specific dietary modifications aiming towards targeted core components such as weight, hypertension, lipid management, diabetes, heart failure and other comorbidities.    Expected Outcomes  Short Term Goal: Understand basic principles of dietary content, such as calories, fat, sodium, cholesterol and nutrients.       Nutrition Assessments: Nutrition Assessments - 09/25/17 0957      MEDFICTS Scores   Pre Score  50       Nutrition Goals Re-Evaluation:   Nutrition Goals Re-Evaluation:   Nutrition Goals Discharge (Final Nutrition Goals Re-Evaluation):   Psychosocial: Target Goals: Acknowledge presence or absence of significant depression and/or stress, maximize coping skills, provide positive support system. Participant is able to verbalize types and ability to use techniques and skills needed for reducing stress and depression.  Initial Review & Psychosocial Screening: Initial Psych Review & Screening - 09/25/17 1238      Initial Review   Current issues with  Current Stress  Concerns;Current Anxiety/Panic    Source of Stress Concerns  Family    Comments  Pt reports feeling "crazy about my symptoms" as she feels that she is young and doesn't smoke.  Her mother passed away recently and her children hae moved away to college.       Family Dynamics   Good Support System?  Yes   Pt reports her husband, children, family, and friends as sources of support.      Barriers   Psychosocial barriers to participate in program  The patient should benefit from training in stress management and relaxation.  Screening Interventions   Interventions  Encouraged to exercise;Provide feedback about the scores to participant;To provide support and resources with identified psychosocial needs    Expected Outcomes  Short Term goal: Identification and review with participant of any Quality of Life or Depression concerns found by scoring the questionnaire.;Long Term goal: The participant improves quality of Life and PHQ9 Scores as seen by post scores and/or verbalization of changes       Quality of Life Scores: Quality of Life - 09/25/17 1212      Quality of Life   Select  Quality of Life      Quality of Life Scores   Health/Function Pre  16.77 %    Socioeconomic Pre  21.67 %    Psych/Spiritual Pre  19.57 %    Family Pre  22.8 %    GLOBAL Pre  19.17 %      Scores of 19 and below usually indicate a poorer quality of life in these areas.  A difference of  2-3 points is a clinically meaningful difference.  A difference of 2-3 points in the total score of the Quality of Life Index has been associated with significant improvement in overall quality of life, self-image, physical symptoms, and general health in studies assessing change in quality of life.  PHQ-9: Recent Review Flowsheet Data    Depression screen [facility] 2/9 10/01/2017   Decreased Interest 0   Down, Depressed, Hopeless 0   PHQ - 2 Score 0     Interpretation of Total Score  Total Score Depression Severity:  1-4 =  Minimal depression, 5-9 = Mild depression, 10-14 = Moderate depression, 15-19 = Moderately severe depression, 20-27 = Severe depression   Psychosocial Evaluation and Intervention: Psychosocial Evaluation - 10/01/17 1549      Psychosocial Evaluation & Interventions   Interventions  Encouraged to exercise with the program and follow exercise prescription;Stress management education;Relaxation education    Comments  No interventions necessary at this time. Pt enjoys tennis, traveling, and hanging out with friends.    Expected Outcomes  Nancy Sparks will report understanding of her CV event and less stress about its occurrence.    Continue Psychosocial Services   Follow up required by staff       Psychosocial Re-Evaluation:   Psychosocial Discharge (Final Psychosocial Re-Evaluation):   Vocational Rehabilitation: Provide vocational rehab assistance to qualifying candidates.   Vocational Rehab Evaluation & Intervention: Vocational Rehab - 09/25/17 1242      Initial Vocational Rehab Evaluation & Intervention   Assessment shows need for Vocational Rehabilitation  No       Education: Education Goals: Education classes will be provided on a weekly basis, covering required topics. Participant will state understanding/return demonstration of topics presented.  Learning Barriers/Preferences: Learning Barriers/Preferences - 09/25/17 1209      Learning Barriers/Preferences   Learning Barriers  None    Learning Preferences  Skilled Demonstration;Individual Instruction;Pictoral;Video;Written Material       Education Topics: Count Your Pulse:  -Group instruction provided by verbal instruction, demonstration, patient participation and written materials to support subject.  Instructors address importance of being able to find your pulse and how to count your pulse when at home without a heart monitor.  Patients get hands on experience counting their pulse with staff help and individually.   Heart  Attack, Angina, and Risk Factor Modification:  -Group instruction provided by verbal instruction, video, and written materials to support subject.  Instructors address signs and symptoms of angina and heart attacks.  Also discuss risk factors for heart disease and how to make changes to improve heart health risk factors.   Functional Fitness:  -Group instruction provided by verbal instruction, demonstration, patient participation, and written materials to support subject.  Instructors address safety measures for doing things around the house.  Discuss how to get up and down off the floor, how to pick things up properly, how to safely get out of a chair without assistance, and balance training.   Meditation and Mindfulness:  -Group instruction provided by verbal instruction, patient participation, and written materials to support subject.  Instructor addresses importance of mindfulness and meditation practice to help reduce stress and improve awareness.  Instructor also leads participants through a meditation exercise.    Stretching for Flexibility and Mobility:  -Group instruction provided by verbal instruction, patient participation, and written materials to support subject.  Instructors lead participants through series of stretches that are designed to increase flexibility thus improving mobility.  These stretches are additional exercise for major muscle groups that are typically performed during regular warm up and cool down.   Hands Only CPR:  -Group verbal, video, and participation provides a basic overview of AHA guidelines for community CPR. Role-play of emergencies allow participants the opportunity to practice calling for help and chest compression technique with discussion of AED use.   Hypertension: -Group verbal and written instruction that provides a basic overview of hypertension including the most recent diagnostic guidelines, risk factor reduction with self-care instructions  and medication management.    Nutrition I class: Heart Healthy Eating:  -Group instruction provided by PowerPoint slides, verbal discussion, and written materials to support subject matter. The instructor gives an explanation and review of the Therapeutic Lifestyle Changes diet recommendations, which includes a discussion on lipid goals, dietary fat, sodium, fiber, plant stanol/sterol esters, sugar, and the components of a well-balanced, healthy diet.   Nutrition II class: Lifestyle Skills:  -Group instruction provided by PowerPoint slides, verbal discussion, and written materials to support subject matter. The instructor gives an explanation and review of label reading, grocery shopping for heart health, heart healthy recipe modifications, and ways to make healthier choices when eating out.   Diabetes Question & Answer:  -Group instruction provided by PowerPoint slides, verbal discussion, and written materials to support subject matter. The instructor gives an explanation and review of diabetes co-morbidities, pre- and post-prandial blood glucose goals, pre-exercise blood glucose goals, signs, symptoms, and treatment of hypoglycemia and hyperglycemia, and foot care basics.   Diabetes Blitz:  -Group instruction provided by PowerPoint slides, verbal discussion, and written materials to support subject matter. The instructor gives an explanation and review of the physiology behind type 1 and type 2 diabetes, diabetes medications and rational behind using different medications, pre- and post-prandial blood glucose recommendations and Hemoglobin A1c goals, diabetes diet, and exercise including blood glucose guidelines for exercising safely.    Portion Distortion:  -Group instruction provided by PowerPoint slides, verbal discussion, written materials, and food models to support subject matter. The instructor gives an explanation of serving size versus portion size, changes in portions sizes over the  last 20 years, and what consists of a serving from each food group.   Stress Management:  -Group instruction provided by verbal instruction, video, and written materials to support subject matter.  Instructors review role of stress in heart disease and how to cope with stress positively.     Exercising on Your Own:  -Group instruction provided by verbal instruction, power point, and written materials  to support subject.  Instructors discuss benefits of exercise, components of exercise, frequency and intensity of exercise, and end points for exercise.  Also discuss use of nitroglycerin and activating EMS.  Review options of places to exercise outside of rehab.  Review guidelines for sex with heart disease.   Cardiac Drugs I:  -Group instruction provided by verbal instruction and written materials to support subject.  Instructor reviews cardiac drug classes: antiplatelets, anticoagulants, beta blockers, and statins.  Instructor discusses reasons, side effects, and lifestyle considerations for each drug class.   Cardiac Drugs II:  -Group instruction provided by verbal instruction and written materials to support subject.  Instructor reviews cardiac drug classes: angiotensin converting enzyme inhibitors (ACE-I), angiotensin II receptor blockers (ARBs), nitrates, and calcium channel blockers.  Instructor discusses reasons, side effects, and lifestyle considerations for each drug class.   Anatomy and Physiology of the Circulatory System:  Group verbal and written instruction and models provide basic cardiac anatomy and physiology, with the coronary electrical and arterial systems. Review of: AMI, Angina, Valve disease, Heart Failure, Peripheral Artery Disease, Cardiac Arrhythmia, Pacemakers, and the ICD.   Other Education:  -Group or individual verbal, written, or video instructions that support the educational goals of the cardiac rehab program.   Holiday Eating Survival Tips:  -Group  instruction provided by PowerPoint slides, verbal discussion, and written materials to support subject matter. The instructor gives patients tips, tricks, and techniques to help them not only survive but enjoy the holidays despite the onslaught of food that accompanies the holidays.   Knowledge Questionnaire Score: Knowledge Questionnaire Score - 09/25/17 1216      Knowledge Questionnaire Score   Pre Score  19/24       Core Components/Risk Factors/Patient Goals at Admission: Personal Goals and Risk Factors at Admission - 09/25/17 1214      Core Components/Risk Factors/Patient Goals on Admission   Stress  Yes    Intervention  Offer individual and/or small group education and counseling on adjustment to heart disease, stress management and health-related lifestyle change. Teach and support self-help strategies.;Refer participants experiencing significant psychosocial distress to appropriate mental health specialists for further evaluation and treatment. When possible, include family members and significant others in education/counseling sessions.    Expected Outcomes  Short Term: Participant demonstrates changes in health-related behavior, relaxation and other stress management skills, ability to obtain effective social support, and compliance with psychotropic medications if prescribed.;Long Term: Emotional wellbeing is indicated by absence of clinically significant psychosocial distress or social isolation.       Core Components/Risk Factors/Patient Goals Review:  Goals and Risk Factor Review    Row Name 10/01/17 1553             Core Components/Risk Factors/Patient Goals Review   Personal Goals Review  Weight Management/Obesity;Lipids;Hypertension;Stress       Review  Pt with multiple CAD RFs willing to participate in CR exercise. Nancy Sparks would like to lose weight and increase her amount of steps per day.        Expected Outcomes  Nancy Sparks will continue to participate in CR exercise,  nutrition, and lifestyle modification opportunities.           Core Components/Risk Factors/Patient Goals at Discharge (Final Review):  Goals and Risk Factor Review - 10/01/17 1553      Core Components/Risk Factors/Patient Goals Review   Personal Goals Review  Weight Management/Obesity;Lipids;Hypertension;Stress    Review  Pt with multiple CAD RFs willing to participate in CR exercise. Nancy Sparks would like  to lose weight and increase her amount of steps per day.     Expected Outcomes  Nancy Sparks will continue to participate in CR exercise, nutrition, and lifestyle modification opportunities.        ITP Comments: ITP Comments    Row Name 09/25/17 1152 10/01/17 1548 10/09/17 1550       ITP Comments  Dr. Fransico Him, Medical Director  30 Day ITP Review.  Pt started exercise today and tolerated it well.   30 Day ITP Review.  Tarnisha is off to a good start to exercise        Comments: See ITP comments.Barnet Pall, RN,BSN 10/09/2017 3:52 PM

## 2017-10-10 ENCOUNTER — Encounter (HOSPITAL_COMMUNITY)
Admission: RE | Admit: 2017-10-10 | Discharge: 2017-10-10 | Disposition: A | Discharge status: Home / Self Care | Payer: BLUE CROSS/BLUE SHIELD | Source: Ambulatory Visit | Attending: Cardiovascular Disease | Admitting: Cardiovascular Disease

## 2017-10-10 ENCOUNTER — Encounter (HOSPITAL_COMMUNITY): Payer: BLUE CROSS/BLUE SHIELD

## 2017-10-10 DIAGNOSIS — Z87891 Personal history of nicotine dependence: Secondary | ICD-10-CM | POA: Diagnosis not present

## 2017-10-10 DIAGNOSIS — K219 Gastro-esophageal reflux disease without esophagitis: Secondary | ICD-10-CM | POA: Diagnosis not present

## 2017-10-10 DIAGNOSIS — Z955 Presence of coronary angioplasty implant and graft: Secondary | ICD-10-CM | POA: Diagnosis not present

## 2017-10-10 DIAGNOSIS — Z7902 Long term (current) use of antithrombotics/antiplatelets: Secondary | ICD-10-CM | POA: Diagnosis not present

## 2017-10-10 DIAGNOSIS — I2510 Atherosclerotic heart disease of native coronary artery without angina pectoris: Secondary | ICD-10-CM | POA: Diagnosis not present

## 2017-10-10 DIAGNOSIS — Z86010 Personal history of colonic polyps: Secondary | ICD-10-CM | POA: Diagnosis not present

## 2017-10-10 DIAGNOSIS — F329 Major depressive disorder, single episode, unspecified: Secondary | ICD-10-CM | POA: Diagnosis not present

## 2017-10-10 DIAGNOSIS — I10 Essential (primary) hypertension: Secondary | ICD-10-CM | POA: Diagnosis not present

## 2017-10-10 DIAGNOSIS — Z79899 Other long term (current) drug therapy: Secondary | ICD-10-CM | POA: Diagnosis not present

## 2017-10-10 DIAGNOSIS — E785 Hyperlipidemia, unspecified: Secondary | ICD-10-CM | POA: Diagnosis not present

## 2017-10-10 DIAGNOSIS — Z7982 Long term (current) use of aspirin: Secondary | ICD-10-CM | POA: Diagnosis not present

## 2017-10-10 DIAGNOSIS — F419 Anxiety disorder, unspecified: Secondary | ICD-10-CM | POA: Diagnosis not present

## 2017-10-13 ENCOUNTER — Encounter (HOSPITAL_COMMUNITY): Payer: BLUE CROSS/BLUE SHIELD

## 2017-10-13 ENCOUNTER — Encounter (HOSPITAL_COMMUNITY)
Admission: RE | Admit: 2017-10-13 | Discharge: 2017-10-13 | Disposition: A | Discharge status: Home / Self Care | Payer: BLUE CROSS/BLUE SHIELD | Source: Ambulatory Visit | Attending: Cardiovascular Disease | Admitting: Cardiovascular Disease

## 2017-10-13 DIAGNOSIS — Z7982 Long term (current) use of aspirin: Secondary | ICD-10-CM | POA: Diagnosis not present

## 2017-10-13 DIAGNOSIS — I2510 Atherosclerotic heart disease of native coronary artery without angina pectoris: Secondary | ICD-10-CM | POA: Diagnosis not present

## 2017-10-13 DIAGNOSIS — Z86010 Personal history of colonic polyps: Secondary | ICD-10-CM | POA: Diagnosis not present

## 2017-10-13 DIAGNOSIS — Z7902 Long term (current) use of antithrombotics/antiplatelets: Secondary | ICD-10-CM | POA: Diagnosis not present

## 2017-10-13 DIAGNOSIS — Z79899 Other long term (current) drug therapy: Secondary | ICD-10-CM | POA: Diagnosis not present

## 2017-10-13 DIAGNOSIS — F419 Anxiety disorder, unspecified: Secondary | ICD-10-CM | POA: Diagnosis not present

## 2017-10-13 DIAGNOSIS — F329 Major depressive disorder, single episode, unspecified: Secondary | ICD-10-CM | POA: Diagnosis not present

## 2017-10-13 DIAGNOSIS — K219 Gastro-esophageal reflux disease without esophagitis: Secondary | ICD-10-CM | POA: Diagnosis not present

## 2017-10-13 DIAGNOSIS — Z955 Presence of coronary angioplasty implant and graft: Secondary | ICD-10-CM

## 2017-10-13 DIAGNOSIS — I10 Essential (primary) hypertension: Secondary | ICD-10-CM | POA: Diagnosis not present

## 2017-10-13 DIAGNOSIS — Z87891 Personal history of nicotine dependence: Secondary | ICD-10-CM | POA: Diagnosis not present

## 2017-10-13 DIAGNOSIS — E785 Hyperlipidemia, unspecified: Secondary | ICD-10-CM | POA: Diagnosis not present

## 2017-10-13 NOTE — Progress Notes (Signed)
QUALITY OF LIFE SCORE REVIEW  Nancy Sparks completed Quality of Life survey as a participant in Cardiac Rehab. Scores 21.0 or below are considered low. Pt score very low in several areas Overall 19.17, Health and Function 16.77, socioeconomic 21.67, physiological and spiritual 19.57, family 22.80. Patient quality of life slightly altered by physical constraints which limits ability to perform as prior to recent cardiac illness. Nancy Sparks says she has more self confidence since starting the program and denies feeling depressed.  Offered emotional support and reassurance.  Will continue to monitor and intervene as necessary. Will fax quality of life questionnaire to Dr. Victorino December office for review. Nancy Sparks has been doing well with exercise. Nancy Sparks's vital signs have been stable.Barnet Pall, RN,BSN 10/13/2017 12:12 PM

## 2017-10-13 NOTE — Progress Notes (Signed)
Reviewed home exercise guidelines with patient including endpoints, temperature precautions, target heart rate and rate of perceived exertion. Pt plans to walk as her mode of home exercise. Pt voices understanding of instructions given. Belenda Alviar M Flo Berroa, MS, ACSM CEP  

## 2017-10-15 ENCOUNTER — Encounter (HOSPITAL_COMMUNITY): Payer: BLUE CROSS/BLUE SHIELD

## 2017-10-15 DIAGNOSIS — M25571 Pain in right ankle and joints of right foot: Secondary | ICD-10-CM | POA: Diagnosis not present

## 2017-10-15 DIAGNOSIS — E7800 Pure hypercholesterolemia, unspecified: Secondary | ICD-10-CM | POA: Diagnosis not present

## 2017-10-15 DIAGNOSIS — E663 Overweight: Secondary | ICD-10-CM | POA: Diagnosis not present

## 2017-10-15 DIAGNOSIS — R7301 Impaired fasting glucose: Secondary | ICD-10-CM | POA: Diagnosis not present

## 2017-10-15 DIAGNOSIS — Z23 Encounter for immunization: Secondary | ICD-10-CM | POA: Diagnosis not present

## 2017-10-15 DIAGNOSIS — Z1389 Encounter for screening for other disorder: Secondary | ICD-10-CM | POA: Diagnosis not present

## 2017-10-15 DIAGNOSIS — I2510 Atherosclerotic heart disease of native coronary artery without angina pectoris: Secondary | ICD-10-CM | POA: Diagnosis not present

## 2017-10-17 ENCOUNTER — Encounter (HOSPITAL_COMMUNITY): Payer: BLUE CROSS/BLUE SHIELD

## 2017-10-17 ENCOUNTER — Ambulatory Visit: Payer: BLUE CROSS/BLUE SHIELD | Admitting: Cardiovascular Disease

## 2017-10-20 ENCOUNTER — Encounter (HOSPITAL_COMMUNITY): Payer: BLUE CROSS/BLUE SHIELD

## 2017-10-20 ENCOUNTER — Encounter (HOSPITAL_COMMUNITY)
Admission: RE | Admit: 2017-10-20 | Discharge: 2017-10-20 | Disposition: A | Discharge status: Home / Self Care | Payer: BLUE CROSS/BLUE SHIELD | Source: Ambulatory Visit | Attending: Cardiovascular Disease | Admitting: Cardiovascular Disease

## 2017-10-20 DIAGNOSIS — Z955 Presence of coronary angioplasty implant and graft: Secondary | ICD-10-CM

## 2017-10-20 DIAGNOSIS — K219 Gastro-esophageal reflux disease without esophagitis: Secondary | ICD-10-CM | POA: Diagnosis not present

## 2017-10-20 DIAGNOSIS — F419 Anxiety disorder, unspecified: Secondary | ICD-10-CM | POA: Diagnosis not present

## 2017-10-20 DIAGNOSIS — Z86010 Personal history of colonic polyps: Secondary | ICD-10-CM | POA: Diagnosis not present

## 2017-10-20 DIAGNOSIS — I2510 Atherosclerotic heart disease of native coronary artery without angina pectoris: Secondary | ICD-10-CM | POA: Diagnosis not present

## 2017-10-20 DIAGNOSIS — Z7982 Long term (current) use of aspirin: Secondary | ICD-10-CM | POA: Diagnosis not present

## 2017-10-20 DIAGNOSIS — E785 Hyperlipidemia, unspecified: Secondary | ICD-10-CM | POA: Diagnosis not present

## 2017-10-20 DIAGNOSIS — F329 Major depressive disorder, single episode, unspecified: Secondary | ICD-10-CM | POA: Diagnosis not present

## 2017-10-20 DIAGNOSIS — Z79899 Other long term (current) drug therapy: Secondary | ICD-10-CM | POA: Diagnosis not present

## 2017-10-20 DIAGNOSIS — I10 Essential (primary) hypertension: Secondary | ICD-10-CM | POA: Diagnosis not present

## 2017-10-20 DIAGNOSIS — Z7902 Long term (current) use of antithrombotics/antiplatelets: Secondary | ICD-10-CM | POA: Diagnosis not present

## 2017-10-20 DIAGNOSIS — Z87891 Personal history of nicotine dependence: Secondary | ICD-10-CM | POA: Diagnosis not present

## 2017-10-21 ENCOUNTER — Other Ambulatory Visit: Payer: BLUE CROSS/BLUE SHIELD

## 2017-10-22 ENCOUNTER — Encounter (HOSPITAL_COMMUNITY)
Admission: RE | Admit: 2017-10-22 | Discharge: 2017-10-22 | Disposition: A | Discharge status: Home / Self Care | Payer: BLUE CROSS/BLUE SHIELD | Source: Ambulatory Visit | Attending: Cardiovascular Disease | Admitting: Cardiovascular Disease

## 2017-10-22 ENCOUNTER — Encounter (HOSPITAL_COMMUNITY): Payer: BLUE CROSS/BLUE SHIELD

## 2017-10-22 DIAGNOSIS — Z7982 Long term (current) use of aspirin: Secondary | ICD-10-CM | POA: Diagnosis not present

## 2017-10-22 DIAGNOSIS — F419 Anxiety disorder, unspecified: Secondary | ICD-10-CM | POA: Diagnosis not present

## 2017-10-22 DIAGNOSIS — I10 Essential (primary) hypertension: Secondary | ICD-10-CM | POA: Diagnosis not present

## 2017-10-22 DIAGNOSIS — I2510 Atherosclerotic heart disease of native coronary artery without angina pectoris: Secondary | ICD-10-CM | POA: Diagnosis not present

## 2017-10-22 DIAGNOSIS — Z955 Presence of coronary angioplasty implant and graft: Secondary | ICD-10-CM

## 2017-10-22 DIAGNOSIS — Z87891 Personal history of nicotine dependence: Secondary | ICD-10-CM | POA: Diagnosis not present

## 2017-10-22 DIAGNOSIS — K219 Gastro-esophageal reflux disease without esophagitis: Secondary | ICD-10-CM | POA: Diagnosis not present

## 2017-10-22 DIAGNOSIS — Z86010 Personal history of colonic polyps: Secondary | ICD-10-CM | POA: Diagnosis not present

## 2017-10-22 DIAGNOSIS — Z79899 Other long term (current) drug therapy: Secondary | ICD-10-CM | POA: Diagnosis not present

## 2017-10-22 DIAGNOSIS — E785 Hyperlipidemia, unspecified: Secondary | ICD-10-CM | POA: Diagnosis not present

## 2017-10-22 DIAGNOSIS — F329 Major depressive disorder, single episode, unspecified: Secondary | ICD-10-CM | POA: Diagnosis not present

## 2017-10-22 DIAGNOSIS — Z7902 Long term (current) use of antithrombotics/antiplatelets: Secondary | ICD-10-CM | POA: Diagnosis not present

## 2017-10-23 ENCOUNTER — Telehealth: Payer: Self-pay | Admitting: Pharmacist

## 2017-10-23 NOTE — Telephone Encounter (Signed)
Lipids drawn at PCP on 10/16/17 at [place] [facility]:  TC 165, TG 138, HDL 85, LDL 52.  Lipids much better since starting Repatha injections and LDL is now at goal < 70 (baseline 07/25/17: TC 263, TG 58, HDL 78, LDL 173). Pt aware to continue with Repatha injections.

## 2017-10-24 ENCOUNTER — Encounter (HOSPITAL_COMMUNITY): Payer: BLUE CROSS/BLUE SHIELD

## 2017-10-24 ENCOUNTER — Encounter (HOSPITAL_COMMUNITY)
Admission: RE | Admit: 2017-10-24 | Discharge: 2017-10-24 | Disposition: A | Discharge status: Home / Self Care | Payer: BLUE CROSS/BLUE SHIELD | Source: Ambulatory Visit | Attending: Cardiovascular Disease | Admitting: Cardiovascular Disease

## 2017-10-24 DIAGNOSIS — Z7982 Long term (current) use of aspirin: Secondary | ICD-10-CM | POA: Diagnosis not present

## 2017-10-24 DIAGNOSIS — Z79899 Other long term (current) drug therapy: Secondary | ICD-10-CM | POA: Diagnosis not present

## 2017-10-24 DIAGNOSIS — Z86010 Personal history of colonic polyps: Secondary | ICD-10-CM | POA: Diagnosis not present

## 2017-10-24 DIAGNOSIS — Z87891 Personal history of nicotine dependence: Secondary | ICD-10-CM | POA: Diagnosis not present

## 2017-10-24 DIAGNOSIS — F419 Anxiety disorder, unspecified: Secondary | ICD-10-CM | POA: Diagnosis not present

## 2017-10-24 DIAGNOSIS — Z7902 Long term (current) use of antithrombotics/antiplatelets: Secondary | ICD-10-CM | POA: Diagnosis not present

## 2017-10-24 DIAGNOSIS — Z955 Presence of coronary angioplasty implant and graft: Secondary | ICD-10-CM

## 2017-10-24 DIAGNOSIS — K219 Gastro-esophageal reflux disease without esophagitis: Secondary | ICD-10-CM | POA: Diagnosis not present

## 2017-10-24 DIAGNOSIS — I2510 Atherosclerotic heart disease of native coronary artery without angina pectoris: Secondary | ICD-10-CM | POA: Diagnosis not present

## 2017-10-24 DIAGNOSIS — I10 Essential (primary) hypertension: Secondary | ICD-10-CM | POA: Diagnosis not present

## 2017-10-24 DIAGNOSIS — E785 Hyperlipidemia, unspecified: Secondary | ICD-10-CM | POA: Diagnosis not present

## 2017-10-24 DIAGNOSIS — F329 Major depressive disorder, single episode, unspecified: Secondary | ICD-10-CM | POA: Diagnosis not present

## 2017-10-27 ENCOUNTER — Encounter (HOSPITAL_COMMUNITY)
Admission: RE | Admit: 2017-10-27 | Discharge: 2017-10-27 | Disposition: A | Discharge status: Home / Self Care | Payer: BLUE CROSS/BLUE SHIELD | Source: Ambulatory Visit | Attending: Cardiovascular Disease | Admitting: Cardiovascular Disease

## 2017-10-27 ENCOUNTER — Encounter (HOSPITAL_COMMUNITY): Payer: BLUE CROSS/BLUE SHIELD

## 2017-10-27 DIAGNOSIS — Z7982 Long term (current) use of aspirin: Secondary | ICD-10-CM | POA: Diagnosis not present

## 2017-10-27 DIAGNOSIS — I10 Essential (primary) hypertension: Secondary | ICD-10-CM | POA: Diagnosis not present

## 2017-10-27 DIAGNOSIS — Z7902 Long term (current) use of antithrombotics/antiplatelets: Secondary | ICD-10-CM | POA: Diagnosis not present

## 2017-10-27 DIAGNOSIS — Z86010 Personal history of colonic polyps: Secondary | ICD-10-CM | POA: Diagnosis not present

## 2017-10-27 DIAGNOSIS — Z955 Presence of coronary angioplasty implant and graft: Secondary | ICD-10-CM | POA: Diagnosis not present

## 2017-10-27 DIAGNOSIS — F329 Major depressive disorder, single episode, unspecified: Secondary | ICD-10-CM | POA: Diagnosis not present

## 2017-10-27 DIAGNOSIS — E785 Hyperlipidemia, unspecified: Secondary | ICD-10-CM | POA: Diagnosis not present

## 2017-10-27 DIAGNOSIS — F419 Anxiety disorder, unspecified: Secondary | ICD-10-CM | POA: Diagnosis not present

## 2017-10-27 DIAGNOSIS — Z79899 Other long term (current) drug therapy: Secondary | ICD-10-CM | POA: Diagnosis not present

## 2017-10-27 DIAGNOSIS — K219 Gastro-esophageal reflux disease without esophagitis: Secondary | ICD-10-CM | POA: Diagnosis not present

## 2017-10-27 DIAGNOSIS — Z87891 Personal history of nicotine dependence: Secondary | ICD-10-CM | POA: Diagnosis not present

## 2017-10-27 DIAGNOSIS — I2510 Atherosclerotic heart disease of native coronary artery without angina pectoris: Secondary | ICD-10-CM | POA: Diagnosis not present

## 2017-10-29 ENCOUNTER — Encounter (HOSPITAL_COMMUNITY)
Admission: RE | Admit: 2017-10-29 | Discharge: 2017-10-29 | Disposition: A | Discharge status: Home / Self Care | Payer: BLUE CROSS/BLUE SHIELD | Source: Ambulatory Visit | Attending: Cardiovascular Disease | Admitting: Cardiovascular Disease

## 2017-10-29 ENCOUNTER — Encounter (HOSPITAL_COMMUNITY): Payer: BLUE CROSS/BLUE SHIELD

## 2017-10-29 DIAGNOSIS — Z86010 Personal history of colonic polyps: Secondary | ICD-10-CM | POA: Diagnosis not present

## 2017-10-29 DIAGNOSIS — Z79899 Other long term (current) drug therapy: Secondary | ICD-10-CM | POA: Diagnosis not present

## 2017-10-29 DIAGNOSIS — K219 Gastro-esophageal reflux disease without esophagitis: Secondary | ICD-10-CM | POA: Diagnosis not present

## 2017-10-29 DIAGNOSIS — I10 Essential (primary) hypertension: Secondary | ICD-10-CM | POA: Diagnosis not present

## 2017-10-29 DIAGNOSIS — Z955 Presence of coronary angioplasty implant and graft: Secondary | ICD-10-CM | POA: Diagnosis not present

## 2017-10-29 DIAGNOSIS — F419 Anxiety disorder, unspecified: Secondary | ICD-10-CM | POA: Diagnosis not present

## 2017-10-29 DIAGNOSIS — Z7982 Long term (current) use of aspirin: Secondary | ICD-10-CM | POA: Diagnosis not present

## 2017-10-29 DIAGNOSIS — E785 Hyperlipidemia, unspecified: Secondary | ICD-10-CM | POA: Diagnosis not present

## 2017-10-29 DIAGNOSIS — Z87891 Personal history of nicotine dependence: Secondary | ICD-10-CM | POA: Diagnosis not present

## 2017-10-29 DIAGNOSIS — I2510 Atherosclerotic heart disease of native coronary artery without angina pectoris: Secondary | ICD-10-CM | POA: Diagnosis not present

## 2017-10-29 DIAGNOSIS — F329 Major depressive disorder, single episode, unspecified: Secondary | ICD-10-CM | POA: Diagnosis not present

## 2017-10-29 DIAGNOSIS — Z7902 Long term (current) use of antithrombotics/antiplatelets: Secondary | ICD-10-CM | POA: Diagnosis not present

## 2017-10-30 ENCOUNTER — Ambulatory Visit: Payer: BLUE CROSS/BLUE SHIELD | Admitting: Cardiovascular Disease

## 2017-10-30 ENCOUNTER — Encounter: Payer: Self-pay | Admitting: Cardiovascular Disease

## 2017-10-30 VITALS — BP 111/78 | HR 69 | Ht 66.25 in | Wt 176.6 lb

## 2017-10-30 DIAGNOSIS — I10 Essential (primary) hypertension: Secondary | ICD-10-CM | POA: Diagnosis not present

## 2017-10-30 DIAGNOSIS — E7801 Familial hypercholesterolemia: Secondary | ICD-10-CM | POA: Diagnosis not present

## 2017-10-30 DIAGNOSIS — R002 Palpitations: Secondary | ICD-10-CM

## 2017-10-30 DIAGNOSIS — I25119 Atherosclerotic heart disease of native coronary artery with unspecified angina pectoris: Secondary | ICD-10-CM

## 2017-10-30 NOTE — Progress Notes (Signed)
Cardiology Office Note:    Date:  11/02/2017   ID:  Nancy Sparks, DOB 1965-08-09, MRN [id number]  PCP:  Haywood Pao, MD  Cardiologist:  Sanda Klein, MD  Electrophysiologist:  None   Referring MD: Haywood Pao, MD   Chief Complaint  Patient presents with  . Follow-up    pt denied chest pain    History of Present Illness:    Nancy Sparks is a 52 y.o. female with a hx of premature onset coronary artery disease presenting with unstable angina and placement of a drug-eluting stent to the right coronary artery in June 2019.  In addition there was a 75% stenosis and a relatively small first diagonal artery, left for medical therapy.  She has significant hyperlipidemia but no other coronary risk factors.  Left ventricular systolic function is preserved.  Baseline LDL was 173, she very likely has heterozygous familial hypercholesterolemia.  On rosuvastatin maximum dose her LDL cholesterol was still elevated at 115.  She has started treatment with Repatha in addition to high-dose rosuvastatin, well-tolerated so far.  Her acute coronary event happened just as she was getting ready [place]  [place] have now rescheduled this for Christmas.  Past Medical History:  Diagnosis Date  . Anemia    "w/pregnancies  . Anxiety    hx  . CAD (coronary artery disease)    LHC 6/19: pLAD, oD1 75, pRCA 20, mRCA 90 >> PCI: DES to RCA // Echo 6/19: EF 55-60, normal wall motion  . Complication of anesthesia    itching relieved with benadryl w/all my surgeries (07/24/2017)  . Depression    hx  . Dyslipidemia   . Fibroid   . GERD (gastroesophageal reflux disease)   . Heart murmur    hx  . Heart palpitations    hx of  . Hx of adenomatous polyp of colon 12/24/2002  . Hypertension     Past Surgical History:  Procedure Laterality Date  . BACK SURGERY    . [place]; 1998; 2001  . COLONOSCOPY    . CORONARY STENT INTERVENTION N/A  07/28/2017   Procedure: CORONARY STENT INTERVENTION;  Surgeon: Troy Sine, MD;  Location: [facility];  Service: Cardiovascular;  Laterality: N/A;  RCA  . LEFT HEART CATH AND CORONARY ANGIOGRAPHY N/A 07/28/2017   Procedure: LEFT HEART CATH AND CORONARY ANGIOGRAPHY;  Surgeon: Troy Sine, MD;  Location: [facility];  Service: Cardiovascular;  Laterality: N/A;  . LEFT HEART CATH AND CORONARY ANGIOGRAPHY N/A 09/04/2017   Procedure: LEFT HEART CATH AND CORONARY ANGIOGRAPHY;  Surgeon: Sherren Mocha, MD;  Location: [facility];  Service: Cardiovascular;  Laterality: N/A;  . LUMBAR LAMINECTOMY/DECOMPRESSION MICRODISCECTOMY  04/25/2011   Procedure: LUMBAR LAMINECTOMY/DECOMPRESSION MICRODISCECTOMY 1 LEVEL;  Surgeon: Elaina Hoops, MD;  Location: [facility];  Service: Neurosurgery;  Laterality: Left;  Left Lumbar Three-Four Laminectomy and Diskectomy  . LUMBAR LAMINECTOMY/DECOMPRESSION MICRODISCECTOMY  04/26/2011   Procedure: LUMBAR LAMINECTOMY/DECOMPRESSION MICRODISCECTOMY 1 LEVEL;  Surgeon: Elaina Hoops, MD;  Location: [facility];  Service: Neurosurgery;  Laterality: N/A;  Re-Exploration of Lumbar Wound and Evacuation of Epidural Hematoma  . [place]  2006  . TUBAL LIGATION  2001    Current Medications: Current Meds  Medication Sig  . ALPRAZolam (XANAX) 0.25 MG tablet Take 1 tablet (0.25 mg total) by mouth daily as needed for anxiety.  Marland Kitchen amLODipine (NORVASC) 2.5  MG tablet Take 1 tablet (2.5 mg total) by mouth daily.  Marland Kitchen aspirin EC 81 MG EC tablet Take 1 tablet (81 mg total) by mouth daily.  . clopidogrel (PLAVIX) 75 MG tablet Take 1 tablet (75 mg total) by mouth daily.  . Evolocumab (REPATHA SURECLICK) 161 MG/ML SOAJ Inject 1 pen into the skin every 14 (fourteen) days.  . isosorbide mononitrate (IMDUR) 30 MG 24 hr tablet Take 1 tablet (30 mg total) by mouth daily.  . metoprolol tartrate (LOPRESSOR) 50 MG tablet Take 1 tablet (50 mg total) by mouth 2 (two) times  daily.  . nitroGLYCERIN (NITROSTAT) 0.4 MG SL tablet Place 1 tablet (0.4 mg total) under the tongue every 5 (five) minutes as needed for chest pain.  . rosuvastatin (CRESTOR) 40 MG tablet Take 1 tablet (40 mg total) by mouth at bedtime.  . Tetrahydrozoline HCl (VISINE OP) Place 1 drop into both eyes daily as needed (dry eyes).  . trazodone (DESYREL) 300 MG tablet Take 300 mg by mouth as directed.     Allergies:   Latex and Tape   Social History   Socioeconomic History  . Marital status: Married    Spouse name: Not on file  . Number of children: 3  . Years of education: Not on file  . Highest education level: Not on file  Occupational History  . Occupation: stay at home mom  Social Needs  . Financial resource strain: Not on file  . Food insecurity:    Worry: Not on file    Inability: Not on file  . Transportation needs:    Medical: Not on file    Non-medical: Not on file  Tobacco Use  . Smoking status: Former Smoker    Packs/day: 0.10    Years: 2.00    Pack years: 0.20    Types: Cigarettes  . Smokeless tobacco: Never Used  . Tobacco comment: quit smoking in college  Substance and Sexual Activity  . Alcohol use: Yes    Alcohol/week: 6.0 standard drinks    Types: 6 Glasses of wine per week  . Drug use: Not Currently    Types: Marijuana    Comment: 07/24/2017 "in college"  . Sexual activity: Not on file  Lifestyle  . Physical activity:    Days per week: Not on file    Minutes per session: Not on file  . Stress: Not on file  Relationships  . Social connections:    Talks on phone: Not on file    Gets together: Not on file    Attends religious service: Not on file    Active member of club or organization: Not on file    Attends meetings of clubs or organizations: Not on file    Relationship status: Not on file  Other Topics Concern  . Not on file  Social History Narrative  . Not on file     Family History: The patient's family history includes Anesthesia  problems in her mother and sister; Atrial fibrillation in her father; Cancer in her sister; Colon cancer (age of onset: 44) in her sister; Coronary artery disease in her father; Diabetes in her father; Heart disease in her father; Hyperlipidemia in her brother, father, mother, and sister; Hypertension in her father and mother; Thyroid disease in her sister.  ROS:   Please see the history of present illness.    All other systems reviewed and are negative.  EKGs/Labs/Other Studies Reviewed:      EKG:  EKG is not  ordered today.   Recent Labs: 03/18/2017: TSH 1.97 07/24/2017: ALT 23 09/03/2017: BUN 20; Creatinine, Ser 0.79; Hemoglobin 13.1; Platelets 178; Potassium 4.0; Sodium 141  Recent Lipid Panel    Component Value Date/Time   CHOL 263 (H) 07/25/2017 1051   TRIG 58 07/25/2017 1051   HDL 78 07/25/2017 1051   CHOLHDL 3.4 07/25/2017 1051   VLDL 12 07/25/2017 1051   LDLCALC 173 (H) 07/25/2017 1051   LDLDIRECT 115 (H) 09/03/2017 1209   LDLDIRECT 155.5 05/14/2011 0924    Physical Exam:    VS:  BP 111/78   Pulse 69   Ht 5' 6.25" (1.683 m)   Wt 176 lb 9.6 oz (80.1 kg)   BMI 28.29 kg/m     Wt Readings from Last 3 Encounters:  10/30/17 176 lb 9.6 oz (80.1 kg)  10/01/17 180 lb 4 oz (81.8 kg)  09/25/17 175 lb 14.8 oz (79.8 kg)     GEN:  Well nourished, well developed in no acute distress HEENT: Normal NECK: No JVD; No carotid bruits LYMPHATICS: No lymphadenopathy CARDIAC: RRR, no murmurs, rubs, gallops RESPIRATORY:  Clear to auscultation without rales, wheezing or rhonchi  ABDOMEN: Soft, non-tender, non-distended MUSCULOSKELETAL:  No edema; No deformity  SKIN: Warm and dry NEUROLOGIC:  Alert and oriented x 3 PSYCHIATRIC:  Normal affect   ASSESSMENT:    1. Coronary artery disease involving native coronary artery of native heart with angina pectoris ([place])   2. Heterozygous familial hypercholesterolemia   3. Essential hypertension   4. Heart palpitations    PLAN:    In  order of problems listed above:  1. CAD: Doing very well with cardiac rehab, planning to return to play tennis.  Asymptomatic with exertion.  For a while she had issues with what appears to be angina at rest, some of it may have been precipitated by anxiety, but it is also possible that she has true coronary vasospasm.  Tolerating dual antiplatelet therapy without bleeding problems.  Continue clopidogrel through June 2020.  Discussed the signs and symptoms of restenosis. 2. HLP: Tolerating combination statin-PCSK9 inhibitor without side effects.  Plan to recheck a lipid profile in about another month. 3. HTN: Excellent control. 4. PVCs: Recently have been much less of a bother.   Medication Adjustments/Labs and Tests Ordered: Current medicines are reviewed at length with the patient today.  Concerns regarding medicines are outlined above.  No orders of the defined types were placed in this encounter.  No orders of the defined types were placed in this encounter.   Patient Instructions  Dr Sallyanne Kuster recommends that you schedule a follow-up appointment in JUNE 2020. You will receive a reminder letter in the mail two months in advance. If you don't receive a letter, please call our office to schedule the follow-up appointment.  If you need a refill on your cardiac medications before your next appointment, please call your pharmacy.    Signed, Sanda Klein, MD  11/02/2017 1:55 PM    [facility]

## 2017-10-30 NOTE — Patient Instructions (Addendum)
Dr Sallyanne Kuster recommends that you schedule a follow-up appointment in JUNE 2020. You will receive a reminder letter in the mail two months in advance. If you don't receive a letter, please call our office to schedule the follow-up appointment.  If you need a refill on your cardiac medications before your next appointment, please call your pharmacy.

## 2017-10-31 ENCOUNTER — Encounter (HOSPITAL_COMMUNITY): Payer: BLUE CROSS/BLUE SHIELD

## 2017-11-02 DIAGNOSIS — E7801 Familial hypercholesterolemia: Secondary | ICD-10-CM | POA: Insufficient documentation

## 2017-11-03 ENCOUNTER — Encounter (HOSPITAL_COMMUNITY): Payer: BLUE CROSS/BLUE SHIELD

## 2017-11-03 ENCOUNTER — Telehealth (HOSPITAL_COMMUNITY): Payer: Self-pay

## 2017-11-03 NOTE — Telephone Encounter (Signed)
Patient called and stated she is will not be attending 9:45am CR class due to stomach virus.

## 2017-11-05 ENCOUNTER — Encounter (HOSPITAL_COMMUNITY)
Admission: RE | Admit: 2017-11-05 | Discharge: 2017-11-05 | Disposition: A | Discharge status: Home / Self Care | Payer: BLUE CROSS/BLUE SHIELD | Source: Ambulatory Visit | Attending: Cardiovascular Disease | Admitting: Cardiovascular Disease

## 2017-11-05 ENCOUNTER — Encounter (HOSPITAL_COMMUNITY): Payer: BLUE CROSS/BLUE SHIELD

## 2017-11-05 VITALS — Wt 178.6 lb

## 2017-11-05 DIAGNOSIS — F419 Anxiety disorder, unspecified: Secondary | ICD-10-CM | POA: Diagnosis not present

## 2017-11-05 DIAGNOSIS — Z7902 Long term (current) use of antithrombotics/antiplatelets: Secondary | ICD-10-CM | POA: Insufficient documentation

## 2017-11-05 DIAGNOSIS — K219 Gastro-esophageal reflux disease without esophagitis: Secondary | ICD-10-CM | POA: Diagnosis not present

## 2017-11-05 DIAGNOSIS — F329 Major depressive disorder, single episode, unspecified: Secondary | ICD-10-CM | POA: Insufficient documentation

## 2017-11-05 DIAGNOSIS — Z7982 Long term (current) use of aspirin: Secondary | ICD-10-CM | POA: Insufficient documentation

## 2017-11-05 DIAGNOSIS — I10 Essential (primary) hypertension: Secondary | ICD-10-CM | POA: Insufficient documentation

## 2017-11-05 DIAGNOSIS — Z79899 Other long term (current) drug therapy: Secondary | ICD-10-CM | POA: Insufficient documentation

## 2017-11-05 DIAGNOSIS — Z955 Presence of coronary angioplasty implant and graft: Secondary | ICD-10-CM | POA: Insufficient documentation

## 2017-11-05 DIAGNOSIS — I2510 Atherosclerotic heart disease of native coronary artery without angina pectoris: Secondary | ICD-10-CM | POA: Diagnosis not present

## 2017-11-05 DIAGNOSIS — E785 Hyperlipidemia, unspecified: Secondary | ICD-10-CM | POA: Insufficient documentation

## 2017-11-05 DIAGNOSIS — Z86010 Personal history of colonic polyps: Secondary | ICD-10-CM | POA: Insufficient documentation

## 2017-11-05 DIAGNOSIS — Z87891 Personal history of nicotine dependence: Secondary | ICD-10-CM | POA: Insufficient documentation

## 2017-11-05 NOTE — Progress Notes (Signed)
Nancy Sparks 52 y.o. female Nutrition Note Spoke with pt. Nutrition plan and goals reviewed with pt. Pt is following heart healthy diet. Pt wants to lose wt. Pt has activelybeen trying to lose wt. 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 Reinforced the importance of portion sizes but also planning ahead for success by using meal planning for the week ahead.Set goal with patient to focus on portion sizes at meals and leveraging non-starchy vegetables and a portion of lean protein to provide satiety. Additionally set a goal for patient to look up recipes and build a grocery list on the weekend for the week ahead. Showed patient websites (american heart association, skinnytaste, drizzle me skinny) for recipes, and discussed how to use portion control and smart swaps to ensure they are heart healthy options. Pt expressed understanding of the information reviewed. Pt aware of nutrition education classes offered and plans on attending nutrition classes.  No results found for: HGBA1C  Wt Readings from Last 3 Encounters:  10/30/17 176 lb 9.6 oz (80.1 kg)  10/01/17 180 lb 4 oz (81.8 kg)  09/25/17 175 lb 14.8 oz (79.8 kg)    Nutrition Diagnosis  Overweight related to excessive energy intake as evidenced by aBody mass index is 28.18 kg/m.  Nutrition Intervention ? Pt's individual nutrition plan reviewed with pt.  Goal(s)  Pt to identify and limit food sources of saturated fat, trans fat, refined carbohydrates and sodium  Pt to identify food quantities necessary to achieve weight loss of 6-24 lbs. at graduation from cardiac rehab. Goal [id number] desired.   Meal prep and plan ahead of time, will set aside time on the weekends to make meal plan, grocery  list, and shop.  Plan:   Pt to attend nutrition classes ? Nutrition I ? Nutrition II ? Portion Distortion   Will provide client-centered nutrition education as part of interdisciplinary care  Monitor and evaluate progress toward nutrition goal with team.    Laurina Bustle, MS, RD, LDN 11/05/2017 10:29 AM

## 2017-11-06 NOTE — Progress Notes (Signed)
Cardiac Individual Treatment Plan  Patient Details  Name: YANISA GOODGAME MRN: [id number] Date of Birth: 1965-02-22 Referring Provider:     CARDIAC REHAB PHASE II ORIENTATION from 09/25/2017 in [place]  Referring Provider  Mertie Moores MD      Initial Encounter Date:    CARDIAC REHAB PHASE II ORIENTATION from 09/25/2017 in [place]  Date  09/25/17      Visit Diagnosis: 07/28/17 Stented coronary artery  Patient's Home Medications on Admission:  Current Outpatient Medications:  .  ALPRAZolam (XANAX) 0.25 MG tablet, Take 1 tablet (0.25 mg total) by mouth daily as needed for anxiety., Disp: 5 tablet, Rfl: 0 .  amLODipine (NORVASC) 2.5 MG tablet, Take 1 tablet (2.5 mg total) by mouth daily., Disp: 90 tablet, Rfl: 3 .  aspirin EC 81 MG EC tablet, Take 1 tablet (81 mg total) by mouth daily., Disp: 90 tablet, Rfl: 3 .  clopidogrel (PLAVIX) 75 MG tablet, Take 1 tablet (75 mg total) by mouth daily., Disp: 90 tablet, Rfl: 3 .  Evolocumab (REPATHA SURECLICK) 782 MG/ML SOAJ, Inject 1 pen into the skin every 14 (fourteen) days., Disp: 2 pen, Rfl: 11 .  isosorbide mononitrate (IMDUR) 30 MG 24 hr tablet, Take 1 tablet (30 mg total) by mouth daily., Disp: 90 tablet, Rfl: 3 .  metoprolol tartrate (LOPRESSOR) 50 MG tablet, Take 1 tablet (50 mg total) by mouth 2 (two) times daily., Disp: 180 tablet, Rfl: 3 .  nitroGLYCERIN (NITROSTAT) 0.4 MG SL tablet, Place 1 tablet (0.4 mg total) under the tongue every 5 (five) minutes as needed for chest pain., Disp: 90 tablet, Rfl: 3 .  rosuvastatin (CRESTOR) 40 MG tablet, Take 1 tablet (40 mg total) by mouth at bedtime., Disp: 90 tablet, Rfl: 3 .  Tetrahydrozoline HCl (VISINE OP), Place 1 drop into both eyes daily as needed (dry eyes)., Disp: , Rfl:  .  trazodone (DESYREL) 300 MG tablet, Take 300 mg by mouth as directed., Disp: , Rfl:   Past Medical History: Past Medical History:  Diagnosis Date   . Anemia    "w/pregnancies  . Anxiety    hx  . CAD (coronary artery disease)    LHC 6/19: pLAD, oD1 75, pRCA 20, mRCA 90 >> PCI: DES to RCA // Echo 6/19: EF 55-60, normal wall motion  . Complication of anesthesia    itching relieved with benadryl w/all my surgeries (07/24/2017)  . Depression    hx  . Dyslipidemia   . Fibroid   . GERD (gastroesophageal reflux disease)   . Heart murmur    hx  . Heart palpitations    hx of  . Hx of adenomatous polyp of colon 12/24/2002  . Hypertension     Tobacco Use: Social History   Tobacco Use  Smoking Status Former Smoker  . Packs/day: 0.10  . Years: 2.00  . Pack years: 0.20  . Types: Cigarettes  Smokeless Tobacco Never Used  Tobacco Comment   quit smoking in college    Labs: Recent Review Flowsheet Data    Labs for ITP Cardiac and Pulmonary Rehab Latest Ref Rng & Units 11/05/2013 04/03/2015 07/06/2015 07/25/2017 09/03/2017   Cholestrol 0 - 200 mg/dL 234(H) 250(H) 337(H) 263(H) -   LDLCALC 0 - 99 mg/dL 146(H) 160(H) 217(H) 173(H) -   LDLDIRECT 0 - 99 mg/dL - - - - 115(H)   HDL >40 mg/dL 62 74 97 78 -   Trlycerides <150 mg/dL 131  79 114 58 -      Capillary Blood Glucose: No results found for: GLUCAP   Exercise Target Goals: Exercise Program Goal: Individual exercise prescription set using results from initial 6 min walk test and THRR while considering  patient's activity barriers and safety.   Exercise Prescription Goal: Initial exercise prescription builds to 30-45 minutes a day of aerobic activity, 2-3 days per week.  Home exercise guidelines will be given to patient during program as part of exercise prescription that the participant will acknowledge.  Activity Barriers & Risk Stratification: Activity Barriers & Cardiac Risk Stratification - 09/25/17 1153      Activity Barriers & Cardiac Risk Stratification   Activity Barriers  Back Problems;Deconditioning;Muscular Weakness    Cardiac Risk Stratification  High       6  Minute Walk: 6 Minute Walk    Row Name 09/25/17 1152         6 Minute Walk   Phase  Initial     Distance  1866 feet     Walk Time  6 minutes     # of Rest Breaks  0     MPH  3.5     METS  4.7     RPE  11     VO2 Peak  16.5     Symptoms  No     Resting HR  66 bpm     Resting BP  118/78     Resting Oxygen Saturation   100 %     Exercise Oxygen Saturation  during 6 min walk  98 %     Max Ex. HR  93 bpm     Max Ex. BP  124/80     2 Minute Post BP  108/72        Oxygen Initial Assessment:   Oxygen Re-Evaluation:   Oxygen Discharge (Final Oxygen Re-Evaluation):   Initial Exercise Prescription: Initial Exercise Prescription - 09/25/17 1100      Date of Initial Exercise RX and Referring Provider   Date  09/25/17    Referring Provider  Nahser, Philip MD      Treadmill   MPH  3    Grade  0    Minutes  10    METs  3.3      NuStep   Level  4    SPM  80    Minutes  10    METs  3      Rower   Level  2    Watts  30    Minutes  10    METs  4.8      Prescription Details   Frequency (times per week)  3    Duration  Progress to 30 minutes of continuous aerobic without signs/symptoms of physical distress      Intensity   THRR 40-80% of Max Heartrate  67-134    Ratings of Perceived Exertion  11-13    Perceived Dyspnea  0-4      Progression   Progression  Continue to progress workloads to maintain intensity without signs/symptoms of physical distress.      Resistance Training   Training Prescription  Yes    Weight  4lbs    Reps  10-15       Perform Capillary Blood Glucose checks as needed.  Exercise Prescription Changes: Exercise Prescription Changes    Row Name 10/01/17 [phone] 10/13/17 0949 10/29/17 0947         Response to Exercise  Blood Pressure (Admit)  138/82  138/82  110/80     Blood Pressure (Exercise)  138/80  138/80  138/82     Blood Pressure (Exit)  128/70  128/70  122/80     Heart Rate (Admit)  71 bpm  71 bpm  57 bpm     Heart Rate  (Exercise)  99 bpm  99 bpm  106 bpm     Heart Rate (Exit)  71 bpm  71 bpm  67 bpm     Rating of Perceived Exertion (Exercise)  11  11  13      Symptoms  none  none  none     Duration  Progress to 30 minutes of  aerobic without signs/symptoms of physical distress  Progress to 30 minutes of  aerobic without signs/symptoms of physical distress  Progress to 30 minutes of  aerobic without signs/symptoms of physical distress     Intensity  THRR unchanged  THRR unchanged  THRR unchanged       Progression   Progression  Continue to progress workloads to maintain intensity without signs/symptoms of physical distress.  Continue to progress workloads to maintain intensity without signs/symptoms of physical distress.  Continue to progress workloads to maintain intensity without signs/symptoms of physical distress.     Average METs  4.4  4.3  4.8       Resistance Training   Training Prescription  No Relaxation day, no weights.  Yes  No Relaxation day, no weights.     Weight  -  4lbs  -     Reps  -  10-15  -     Time  -  10 Minutes  -       Interval Training   Interval Training  No  No  No       Treadmill   MPH  3.4  3.5  -     Grade  0  2  -     Minutes  10  10  -     METs  3.6  4.65  -       Bike   Level  -  -  1.8     Minutes  -  -  10     METs  -  -  5.19 Patient switched from NuStep to [employer] bike.       NuStep   Level  4  5  -     SPM  80  85  -     Minutes  10  10  -     METs  -  3.2  -       Rower   Level  3  3  4      Watts  38  34  39     Minutes  10  10  10      METs  5.1  4.98  5.15       Track   Laps  -  -  18     Minutes  -  -  10     METs  -  -  4.14       Home Exercise Plan   Plans to continue exercise at  -  Home (comment)  Home (comment)     Frequency  -  Add 2 additional days to program exercise sessions.  Add 2 additional days to program exercise sessions.     Initial Home Exercises Provided  -  10/13/17  10/13/17  Exercise Comments: Exercise Comments     Row Name 10/01/17 1040 10/13/17 1022 10/22/17 1030 10/29/17 1017     Exercise Comments  Patient tolerated first session of exercise well without c/o.  Reviewed home exercise guidelines with patient.  Patient switched from treadmill to walking track this week due to sprained ankle that's still healing. Addressed qquestions patient had about heart rate and weight loss. Discussed increasing duration of home exercise and possibly trying high intensity interval training to help achieve weight loss goals.  Reviewed METs and goals with patient.       Exercise Goals and Review: Exercise Goals    Row Name 09/25/17 1154             Exercise Goals   Increase Physical Activity  Yes       Intervention  Provide advice, education, support and counseling about physical activity/exercise needs.;Develop an individualized exercise prescription for aerobic and resistive training based on initial evaluation findings, risk stratification, comorbidities and participant's personal goals.       Expected Outcomes  Short Term: Attend rehab on a regular basis to increase amount of physical activity.;Long Term: Exercising regularly at least 3-5 days a week.;Long Term: Add in home exercise to make exercise part of routine and to increase amount of physical activity.       Increase Strength and Stamina  Yes short: be able to walk 5-7,000 steps/day  long: return to playing tennis for 1 hour and be able to walk 10,000 steps per day       Intervention  Provide advice, education, support and counseling about physical activity/exercise needs.;Develop an individualized exercise prescription for aerobic and resistive training based on initial evaluation findings, risk stratification, comorbidities and participant's personal goals.       Expected Outcomes  Short Term: Increase workloads from initial exercise prescription for resistance, speed, and METs.;Short Term: Perform resistance training exercises routinely during rehab and  add in resistance training at home;Long Term: Improve cardiorespiratory fitness, muscular endurance and strength as measured by increased METs and functional capacity (6MWT)       Able to understand and use rate of perceived exertion (RPE) scale  Yes       Intervention  Provide education and explanation on how to use RPE scale       Expected Outcomes  Short Term: Able to use RPE daily in rehab to express subjective intensity level;Long Term:  Able to use RPE to guide intensity level when exercising independently       Knowledge and understanding of Target Heart Rate Range (THRR)  Yes       Intervention  Provide education and explanation of THRR including how the numbers were predicted and where they are located for reference       Expected Outcomes  Short Term: Able to state/look up THRR;Long Term: Able to use THRR to govern intensity when exercising independently;Short Term: Able to use daily as guideline for intensity in rehab       Able to check pulse independently  Yes       Intervention  Provide education and demonstration on how to check pulse in carotid and radial arteries.;Review the importance of being able to check your own pulse for safety during independent exercise       Expected Outcomes  Short Term: Able to explain why pulse checking is important during independent exercise;Long Term: Able to check pulse independently and accurately       Understanding of Exercise Prescription  Yes       Intervention  Provide education, explanation, and written materials on patient's individual exercise prescription       Expected Outcomes  Short Term: Able to explain program exercise prescription;Long Term: Able to explain home exercise prescription to exercise independently          Exercise Goals Re-Evaluation : Exercise Goals Re-Evaluation    Row Name 10/01/17 1224 10/13/17 1022 10/29/17 1017         Exercise Goal Re-Evaluation   Exercise Goals Review  Able to understand and use rate of  perceived exertion (RPE) scale  Able to understand and use rate of perceived exertion (RPE) scale;Understanding of Exercise Prescription;Knowledge and understanding of Target Heart Rate Range (THRR)  Able to understand and use rate of perceived exertion (RPE) scale;Understanding of Exercise Prescription;Knowledge and understanding of Target Heart Rate Range (THRR);Increase Physical Activity     Comments  Patient able to understand and use RPE scale appropriately. Pt is doing some walking at home.  Reviewed home exercise guidelines with patient including THRR, RPE scale, and endpoints for exercise. Pt plans to walk as her mode of home exercise. Pt's goal is to resume playing tennis.  Patient making good progress with exersise, averaging 4.8 METs. Pt "bored" with recumbent stepper, switched to stationary bike and tolerated well. Pt switched temporarily from treadmill to walking track due to healing recently sparined ankle. Pt is walking 30 minutes daily in addition to exercise at [facility]. Goals is to resume playing tennis 2-3 days/week.     Expected Outcomes  Progress workloads as tolerated.  Patient will walk 30 minutes, 1-2 days/week to help improve strength and stamina.  Patient will continue daily walking and exercise at cardiac rehab to help improve fitness. Patient will resume playing tennis.        Discharge Exercise Prescription (Final Exercise Prescription Changes): Exercise Prescription Changes - 10/29/17 0947      Response to Exercise   Blood Pressure (Admit)  110/80    Blood Pressure (Exercise)  138/82    Blood Pressure (Exit)  122/80    Heart Rate (Admit)  57 bpm    Heart Rate (Exercise)  106 bpm    Heart Rate (Exit)  67 bpm    Rating of Perceived Exertion (Exercise)  13    Symptoms  none    Duration  Progress to 30 minutes of  aerobic without signs/symptoms of physical distress    Intensity  THRR unchanged      Progression   Progression  Continue to progress workloads to maintain  intensity without signs/symptoms of physical distress.    Average METs  4.8      Resistance Training   Training Prescription  No   Relaxation day, no weights.     Interval Training   Interval Training  No      Treadmill   MPH  --    Grade  --    Minutes  --    METs  --      Bike   Level  1.8    Minutes  10    METs  5.19   Patient switched from NuStep to [place].     NuStep   Level  --    SPM  --    Minutes  --    METs  --      Rower   Level  4    Watts  39    Minutes  10  METs  5.15      Track   Laps  18    Minutes  10    METs  4.14      Home Exercise Plan   Plans to continue exercise at  Home (comment)    Frequency  Add 2 additional days to program exercise sessions.    Initial Home Exercises Provided  10/13/17       Nutrition:  Target Goals: Understanding of nutrition guidelines, daily intake of sodium 1500mg , cholesterol 200mg , calories 30% from fat and 7% or less from saturated fats, daily to have 5 or more servings of fruits and vegetables.  Biometrics: Pre Biometrics - 09/25/17 1155      Pre Biometrics   Height  5' 6.25" (1.683 m)    Weight  79.8 kg    Waist Circumference  36 inches    Hip Circumference  43 inches    Waist to Hip Ratio  0.84 %    BMI (Calculated)  28.17    Triceps Skinfold  33 mm    % Body Fat  38.9 %    Grip Strength  39 kg    Flexibility  10 in    Single Leg Stand  10 seconds        Nutrition Therapy Plan and Nutrition Goals: Nutrition Therapy & Goals - 09/25/17 0957      Nutrition Therapy   Diet  heart healthy      Personal Nutrition Goals   Nutrition Goal  Pt to identify and limit food sources of saturated fat, trans fat, refined carbohydrates and sodium    Personal Goal #2  Pt to identify food quantities necessary to achieve weight loss of 6-24 lbs. at graduation from cardiac rehab. Goal wt loss of 15-20 lb desired.       Intervention Plan   Intervention  Prescribe, educate and counsel regarding  individualized specific dietary modifications aiming towards targeted core components such as weight, hypertension, lipid management, diabetes, heart failure and other comorbidities.    Expected Outcomes  Short Term Goal: Understand basic principles of dietary content, such as calories, fat, sodium, cholesterol and nutrients.       Nutrition Assessments: Nutrition Assessments - 09/25/17 0957      MEDFICTS Scores   Pre Score  50       Nutrition Goals Re-Evaluation:   Nutrition Goals Re-Evaluation:   Nutrition Goals Discharge (Final Nutrition Goals Re-Evaluation):   Psychosocial: Target Goals: Acknowledge presence or absence of significant depression and/or stress, maximize coping skills, provide positive support system. Participant is able to verbalize types and ability to use techniques and skills needed for reducing stress and depression.  Initial Review & Psychosocial Screening: Initial Psych Review & Screening - 09/25/17 1238      Initial Review   Current issues with  Current Stress Concerns;Current Anxiety/Panic    Source of Stress Concerns  Family    Comments  Pt reports feeling "crazy about my symptoms" as she feels that she is young and doesn't smoke.  Her mother passed away recently and her children hae moved away to college.       Family Dynamics   Good Support System?  Yes   Pt reports her husband, children, family, and friends as sources of support.      Barriers   Psychosocial barriers to participate in program  The patient should benefit from training in stress management and relaxation.      Screening Interventions   Interventions  Encouraged  to exercise;Provide feedback about the scores to participant;To provide support and resources with identified psychosocial needs    Expected Outcomes  Short Term goal: Identification and review with participant of any Quality of Life or Depression concerns found by scoring the questionnaire.;Long Term goal: The participant  improves quality of Life and PHQ9 Scores as seen by post scores and/or verbalization of changes       Quality of Life Scores: Quality of Life - 09/25/17 1212      Quality of Life   Select  Quality of Life      Quality of Life Scores   Health/Function Pre  16.77 %    Socioeconomic Pre  21.67 %    Psych/Spiritual Pre  19.57 %    Family Pre  22.8 %    GLOBAL Pre  19.17 %      Scores of 19 and below usually indicate a poorer quality of life in these areas.  A difference of  2-3 points is a clinically meaningful difference.  A difference of 2-3 points in the total score of the Quality of Life Index has been associated with significant improvement in overall quality of life, self-image, physical symptoms, and general health in studies assessing change in quality of life.  PHQ-9: Recent Review Flowsheet Data    Depression screen [facility] 2/9 10/01/2017   Decreased Interest 0   Down, Depressed, Hopeless 0   PHQ - 2 Score 0     Interpretation of Total Score  Total Score Depression Severity:  1-4 = Minimal depression, 5-9 = Mild depression, 10-14 = Moderate depression, 15-19 = Moderately severe depression, 20-27 = Severe depression   Psychosocial Evaluation and Intervention: Psychosocial Evaluation - 10/01/17 1549      Psychosocial Evaluation & Interventions   Interventions  Encouraged to exercise with the program and follow exercise prescription;Stress management education;Relaxation education    Comments  No interventions necessary at this time. Pt enjoys tennis, traveling, and hanging out with friends.    Expected Outcomes  Kearstin will report understanding of her CV event and less stress about its occurrence.    Continue Psychosocial Services   Follow up required by staff       Psychosocial Re-Evaluation: Psychosocial Re-Evaluation    [place] Name 11/06/17 1224             Psychosocial Re-Evaluation   Current issues with  Current Stress Concerns       Comments  Kerstyn has not voiced  any increased stressor's and feel's better now that she is partcipating in cardiac rehab       Expected Outcomes  Will continue to monitor and offer support as needed       Interventions  Stress management education;Encouraged to attend Cardiac Rehabilitation for the exercise       Continue Psychosocial Services   No Follow up required         Initial Review   Source of Stress Concerns  Family          Psychosocial Discharge (Final Psychosocial Re-Evaluation): Psychosocial Re-Evaluation - 11/06/17 1224      Psychosocial Re-Evaluation   Current issues with  Current Stress Concerns    Comments  Chalice has not voiced any increased stressor's and feel's better now that she is partcipating in cardiac rehab    Expected Outcomes  Will continue to monitor and offer support as needed    Interventions  Stress management education;Encouraged to attend Cardiac Rehabilitation for the exercise  Continue Psychosocial Services   No Follow up required      Initial Review   Source of Stress Concerns  Family       Vocational Rehabilitation: Provide vocational rehab assistance to qualifying candidates.   Vocational Rehab Evaluation & Intervention: Vocational Rehab - 09/25/17 1242      Initial Vocational Rehab Evaluation & Intervention   Assessment shows need for Vocational Rehabilitation  No       Education: Education Goals: Education classes will be provided on a weekly basis, covering required topics. Participant will state understanding/return demonstration of topics presented.  Learning Barriers/Preferences: Learning Barriers/Preferences - 09/25/17 1209      Learning Barriers/Preferences   Learning Barriers  None    Learning Preferences  Skilled Demonstration;Individual Instruction;Pictoral;Video;Written Material       Education Topics: Count Your Pulse:  -Group instruction provided by verbal instruction, demonstration, patient participation and written materials to support  subject.  Instructors address importance of being able to find your pulse and how to count your pulse when at home without a heart monitor.  Patients get hands on experience counting their pulse with staff help and individually.   Heart Attack, Angina, and Risk Factor Modification:  -Group instruction provided by verbal instruction, video, and written materials to support subject.  Instructors address signs and symptoms of angina and heart attacks.    Also discuss risk factors for heart disease and how to make changes to improve heart health risk factors.   CARDIAC REHAB PHASE II EXERCISE from 11/05/2017 in [place]  Date  11/05/17  Instruction Review Code  2- Demonstrated Understanding      Functional Fitness:  -Group instruction provided by verbal instruction, demonstration, patient participation, and written materials to support subject.  Instructors address safety measures for doing things around the house.  Discuss how to get up and down off the floor, how to pick things up properly, how to safely get out of a chair without assistance, and balance training.   Meditation and Mindfulness:  -Group instruction provided by verbal instruction, patient participation, and written materials to support subject.  Instructor addresses importance of mindfulness and meditation practice to help reduce stress and improve awareness.  Instructor also leads participants through a meditation exercise.    Stretching for Flexibility and Mobility:  -Group instruction provided by verbal instruction, patient participation, and written materials to support subject.  Instructors lead participants through series of stretches that are designed to increase flexibility thus improving mobility.  These stretches are additional exercise for major muscle groups that are typically performed during regular warm up and cool down.   Hands Only CPR:  -Group verbal, video, and participation  provides a basic overview of AHA guidelines for community CPR. Role-play of emergencies allow participants the opportunity to practice calling for help and chest compression technique with discussion of AED use.   Hypertension: -Group verbal and written instruction that provides a basic overview of hypertension including the most recent diagnostic guidelines, risk factor reduction with self-care instructions and medication management.    Nutrition I class: Heart Healthy Eating:  -Group instruction provided by PowerPoint slides, verbal discussion, and written materials to support subject matter. The instructor gives an explanation and review of the Therapeutic Lifestyle Changes diet recommendations, which includes a discussion on lipid goals, dietary fat, sodium, fiber, plant stanol/sterol esters, sugar, and the components of a well-balanced, healthy diet.   Nutrition II class: Lifestyle Skills:  -Group instruction provided by PowerPoint slides,  verbal discussion, and written materials to support subject matter. The instructor gives an explanation and review of label reading, grocery shopping for heart health, heart healthy recipe modifications, and ways to make healthier choices when eating out.   Diabetes Question & Answer:  -Group instruction provided by PowerPoint slides, verbal discussion, and written materials to support subject matter. The instructor gives an explanation and review of diabetes co-morbidities, pre- and post-prandial blood glucose goals, pre-exercise blood glucose goals, signs, symptoms, and treatment of hypoglycemia and hyperglycemia, and foot care basics.   Diabetes Blitz:  -Group instruction provided by PowerPoint slides, verbal discussion, and written materials to support subject matter. The instructor gives an explanation and review of the physiology behind type 1 and type 2 diabetes, diabetes medications and rational behind using different medications, pre- and  post-prandial blood glucose recommendations and Hemoglobin A1c goals, diabetes diet, and exercise including blood glucose guidelines for exercising safely.    Portion Distortion:  -Group instruction provided by PowerPoint slides, verbal discussion, written materials, and food models to support subject matter. The instructor gives an explanation of serving size versus portion size, changes in portions sizes over the last 20 years, and what consists of a serving from each food group.   Stress Management:  -Group instruction provided by verbal instruction, video, and written materials to support subject matter.  Instructors review role of stress in heart disease and how to cope with stress positively.     Exercising on Your Own:  -Group instruction provided by verbal instruction, power point, and written materials to support subject.  Instructors discuss benefits of exercise, components of exercise, frequency and intensity of exercise, and end points for exercise.  Also discuss use of nitroglycerin and activating EMS.  Review options of places to exercise outside of rehab.  Review guidelines for sex with heart disease.   Cardiac Drugs I:  -Group instruction provided by verbal instruction and written materials to support subject.  Instructor reviews cardiac drug classes: antiplatelets, anticoagulants, beta blockers, and statins.  Instructor discusses reasons, side effects, and lifestyle considerations for each drug class.   Cardiac Drugs II:  -Group instruction provided by verbal instruction and written materials to support subject.  Instructor reviews cardiac drug classes: angiotensin converting enzyme inhibitors (ACE-I), angiotensin II receptor blockers (ARBs), nitrates, and calcium channel blockers.  Instructor discusses reasons, side effects, and lifestyle considerations for each drug class.   Anatomy and Physiology of the Circulatory System:  Group verbal and written instruction and models  provide basic cardiac anatomy and physiology, with the coronary electrical and arterial systems. Review of: AMI, Angina, Valve disease, Heart Failure, Peripheral Artery Disease, Cardiac Arrhythmia, Pacemakers, and the ICD.   CARDIAC REHAB PHASE II EXERCISE from 11/05/2017 in [facility]  Date  10/29/17  Instruction Review Code  2- Demonstrated Understanding      Other Education:  -Group or individual verbal, written, or video instructions that support the educational goals of the cardiac rehab program.   Holiday Eating Survival Tips:  -Group instruction provided by PowerPoint slides, verbal discussion, and written materials to support subject matter. The instructor gives patients tips, tricks, and techniques to help them not only survive but enjoy the holidays despite the onslaught of food that accompanies the holidays.   Knowledge Questionnaire Score: Knowledge Questionnaire Score - 09/25/17 1216      Knowledge Questionnaire Score   Pre Score  19/24       Core Components/Risk Factors/Patient Goals at Admission: Personal Goals  and Risk Factors at Admission - 09/25/17 1214      Core Components/Risk Factors/Patient Goals on Admission   Stress  Yes    Intervention  Offer individual and/or small group education and counseling on adjustment to heart disease, stress management and health-related lifestyle change. Teach and support self-help strategies.;Refer participants experiencing significant psychosocial distress to appropriate mental health specialists for further evaluation and treatment. When possible, include family members and significant others in education/counseling sessions.    Expected Outcomes  Short Term: Participant demonstrates changes in health-related behavior, relaxation and other stress management skills, ability to obtain effective social support, and compliance with psychotropic medications if prescribed.;Long Term: Emotional wellbeing is  indicated by absence of clinically significant psychosocial distress or social isolation.       Core Components/Risk Factors/Patient Goals Review:  Goals and Risk Factor Review    Row Name 10/01/17 1553 11/06/17 1226           Core Components/Risk Factors/Patient Goals Review   Personal Goals Review  Weight Management/Obesity;Lipids;Hypertension;Stress  Weight Management/Obesity;Lipids;Hypertension;Stress      Review  Pt with multiple CAD RFs willing to participate in CR exercise. Camey would like to lose weight and increase her amount of steps per day.   Pt with multiple CAD RFs willing to participate in CR exercise. Rim has been doing well with exercise. Marybeth's vital signs have been stable.      Expected Outcomes  Crysten will continue to participate in CR exercise, nutrition, and lifestyle modification opportunities.   Tniyah will continue to participate in CR exercise, nutrition, and lifestyle modification opportunities.          Core Components/Risk Factors/Patient Goals at Discharge (Final Review):  Goals and Risk Factor Review - 11/06/17 1226      Core Components/Risk Factors/Patient Goals Review   Personal Goals Review  Weight Management/Obesity;Lipids;Hypertension;Stress    Review  Pt with multiple CAD RFs willing to participate in CR exercise. Ivyonna has been doing well with exercise. Miria's vital signs have been stable.    Expected Outcomes  Laiyla will continue to participate in CR exercise, nutrition, and lifestyle modification opportunities.        ITP Comments: ITP Comments    Row Name 09/25/17 1152 10/01/17 1548 10/09/17 1550 11/06/17 1222     ITP Comments  Dr. Fransico Him, Medical Director  30 Day ITP Review.  Pt started exercise today and tolerated it well.   30 Day ITP Review.  Emiko is off to a good start to exercise  30 Day ITP Review. Patient good attendance and partciaption in phase 2 cardiac rehab       Comments: See ITP comments.Barnet Pall,  RN,BSN 11/06/2017 12:29 PM

## 2017-11-07 ENCOUNTER — Encounter (HOSPITAL_COMMUNITY): Payer: BLUE CROSS/BLUE SHIELD

## 2017-11-10 ENCOUNTER — Encounter (HOSPITAL_COMMUNITY): Payer: BLUE CROSS/BLUE SHIELD

## 2017-11-12 ENCOUNTER — Encounter (HOSPITAL_COMMUNITY): Payer: BLUE CROSS/BLUE SHIELD

## 2017-11-14 ENCOUNTER — Encounter (HOSPITAL_COMMUNITY): Payer: BLUE CROSS/BLUE SHIELD

## 2017-11-17 ENCOUNTER — Encounter (HOSPITAL_COMMUNITY): Payer: Self-pay | Admitting: *Deleted

## 2017-11-17 ENCOUNTER — Encounter (HOSPITAL_COMMUNITY): Payer: BLUE CROSS/BLUE SHIELD

## 2017-11-17 ENCOUNTER — Telehealth (HOSPITAL_COMMUNITY): Payer: Self-pay | Admitting: *Deleted

## 2017-11-17 DIAGNOSIS — Z955 Presence of coronary angioplasty implant and graft: Secondary | ICD-10-CM

## 2017-11-17 NOTE — Progress Notes (Signed)
Discharge Progress Report  Patient Details  Name: Nancy Sparks MRN: [id number] Date of Birth: Sep 19, 1965 Referring Provider:     [facility] from 09/25/2017 in [facility]  Referring Provider  Nahser, Philip MD       Number of Visits: 36  Reason for Discharge:  Early Exit:  Mee is exercising on her own at the gym  Smoking History:  Social History   Tobacco Use  Smoking Status Former Smoker  . Packs/day: 0.10  . Years: 2.00  . Pack years: 0.20  . Types: Cigarettes  Smokeless Tobacco Never Used  Tobacco Comment   quit smoking in college    Diagnosis:  07/28/17 Stented coronary artery  ADL [facility]:   Initial Exercise Prescription: Initial Exercise Prescription - 09/25/17 1100      Date of Initial Exercise RX and Referring Provider   Date  09/25/17    Referring Provider  Nahser, Philip MD      Treadmill   MPH  3    Grade  0    Minutes  10    METs  3.3      NuStep   Level  4    SPM  80    Minutes  10    METs  3      Rower   Level  2    Watts  30    Minutes  10    METs  4.8      Prescription Details   Frequency (times per week)  3    Duration  Progress to 30 minutes of continuous aerobic without signs/symptoms of physical distress      Intensity   THRR 40-80% of Max Heartrate  67-134    Ratings of Perceived Exertion  11-13    Perceived Dyspnea  0-4      Progression   Progression  Continue to progress workloads to maintain intensity without signs/symptoms of physical distress.      Resistance Training   Training Prescription  Yes    Weight  4lbs    Reps  10-15       Discharge Exercise Prescription (Final Exercise Prescription Changes): Exercise Prescription Changes - 11/05/17 0950      Response to Exercise   Blood Pressure (Admit)  112/82    Blood Pressure (Exercise)  110/82    Blood Pressure (Exit)  132/68    Heart Rate (Admit)  70 bpm    Heart Rate (Exercise)  105 bpm    Heart  Rate (Exit)  69 bpm    Rating of Perceived Exertion (Exercise)  12    Symptoms  none    Duration  Progress to 30 minutes of  aerobic without signs/symptoms of physical distress    Intensity  THRR unchanged      Progression   Progression  Continue to progress workloads to maintain intensity without signs/symptoms of physical distress.    Average METs  4.6      Resistance Training   Training Prescription  No   Relaxation day, no weights.     Interval Training   Interval Training  No      Bike   Level  1.8    Minutes  10    METs  5.19      Rower   Level  4    Watts  45    Minutes  10    METs  5.4      Track  Laps  13    Minutes  10    METs  3.26      Home Exercise Plan   Plans to continue exercise at  Home (comment)    Frequency  Add 2 additional days to program exercise sessions.    Initial Home Exercises Provided  10/13/17       Functional Capacity: 6 Minute Walk    Row Name 09/25/17 1152         6 Minute Walk   Phase  Initial     Distance  1866 feet     Walk Time  6 minutes     # of Rest Breaks  0     MPH  3.5     METS  4.7     RPE  11     VO2 Peak  16.5     Symptoms  No     Resting HR  66 bpm     Resting BP  118/78     Resting Oxygen Saturation   100 %     Exercise Oxygen Saturation  during 6 min walk  98 %     Max Ex. HR  93 bpm     Max Ex. BP  124/80     2 Minute Post BP  108/72        Psychological, QOL, Others - Outcomes: PHQ 2/9: Depression screen PHQ 2/9 10/01/2017  Decreased Interest 0  Down, Depressed, Hopeless 0  PHQ - 2 Score 0    Quality of Life: Quality of Life - 09/25/17 1212      Quality of Life   Select  Quality of Life      Quality of Life Scores   Health/Function Pre  16.77 %    Socioeconomic Pre  21.67 %    Psych/Spiritual Pre  19.57 %    Family Pre  22.8 %    GLOBAL Pre  19.17 %       Personal Goals: Goals established at orientation with interventions provided to work toward goal. Personal Goals and Risk  Factors at Admission - 09/25/17 1214      Core Components/Risk Factors/Patient Goals on Admission   Stress  Yes    Intervention  Offer individual and/or small group education and counseling on adjustment to heart disease, stress management and health-related lifestyle change. Teach and support self-help strategies.;Refer participants experiencing significant psychosocial distress to appropriate mental health specialists for further evaluation and treatment. When possible, include family members and significant others in education/counseling sessions.    Expected Outcomes  Short Term: Participant demonstrates changes in health-related behavior, relaxation and other stress management skills, ability to obtain effective social support, and compliance with psychotropic medications if prescribed.;Long Term: Emotional wellbeing is indicated by absence of clinically significant psychosocial distress or social isolation.        Personal Goals Discharge: Goals and Risk Factor Review    Row Name 10/01/17 1553 11/06/17 1226           Core Components/Risk Factors/Patient Goals Review   Personal Goals Review  Weight Management/Obesity;Lipids;Hypertension;Stress  Weight Management/Obesity;Lipids;Hypertension;Stress      Review  Pt with multiple CAD RFs willing to participate in CR exercise. Lometa would like to lose weight and increase her amount of steps per day.   Pt with multiple CAD RFs willing to participate in CR exercise. Shandon has been doing well with exercise. Ernesta's vital signs have been stable.      Expected Outcomes  Priseis will  continue to participate in CR exercise, nutrition, and lifestyle modification opportunities.   Mica will continue to participate in CR exercise, nutrition, and lifestyle modification opportunities.          Exercise Goals and Review: Exercise Goals    Row Name 09/25/17 1154             Exercise Goals   Increase Physical Activity  Yes       Intervention  Provide  advice, education, support and counseling about physical activity/exercise needs.;Develop an individualized exercise prescription for aerobic and resistive training based on initial evaluation findings, risk stratification, comorbidities and participant's personal goals.       Expected Outcomes  Short Term: Attend rehab on a regular basis to increase amount of physical activity.;Long Term: Exercising regularly at least 3-5 days a week.;Long Term: Add in home exercise to make exercise part of routine and to increase amount of physical activity.       Increase Strength and Stamina  Yes short: be able to walk 5-7,000 steps/day  long: return to playing tennis for 1 hour and be able to walk 10,000 steps per day       Intervention  Provide advice, education, support and counseling about physical activity/exercise needs.;Develop an individualized exercise prescription for aerobic and resistive training based on initial evaluation findings, risk stratification, comorbidities and participant's personal goals.       Expected Outcomes  Short Term: Increase workloads from initial exercise prescription for resistance, speed, and METs.;Short Term: Perform resistance training exercises routinely during rehab and add in resistance training at home;Long Term: Improve cardiorespiratory fitness, muscular endurance and strength as measured by increased METs and functional capacity (6MWT)       Able to understand and use rate of perceived exertion (RPE) scale  Yes       Intervention  Provide education and explanation on how to use RPE scale       Expected Outcomes  Short Term: Able to use RPE daily in rehab to express subjective intensity level;Long Term:  Able to use RPE to guide intensity level when exercising independently       Knowledge and understanding of Target Heart Rate Range (THRR)  Yes       Intervention  Provide education and explanation of THRR including how the numbers were predicted and where they are located  for reference       Expected Outcomes  Short Term: Able to state/look up THRR;Long Term: Able to use THRR to govern intensity when exercising independently;Short Term: Able to use daily as guideline for intensity in rehab       Able to check pulse independently  Yes       Intervention  Provide education and demonstration on how to check pulse in carotid and radial arteries.;Review the importance of being able to check your own pulse for safety during independent exercise       Expected Outcomes  Short Term: Able to explain why pulse checking is important during independent exercise;Long Term: Able to check pulse independently and accurately       Understanding of Exercise Prescription  Yes       Intervention  Provide education, explanation, and written materials on patient's individual exercise prescription       Expected Outcomes  Short Term: Able to explain program exercise prescription;Long Term: Able to explain home exercise prescription to exercise independently          Nutrition & Weight - Outcomes: Pre Biometrics -  09/25/17 1155      Pre Biometrics   Height  5' 6.25" (1.683 m)    Weight  175 lb 14.8 oz (79.8 kg)    Waist Circumference  36 inches    Hip Circumference  43 inches    Waist to Hip Ratio  0.84 %    BMI (Calculated)  28.17    Triceps Skinfold  33 mm    % Body Fat  38.9 %    Grip Strength  39 kg    Flexibility  10 in    Single Leg Stand  10 seconds      Post Biometrics - 11/05/17 0950       Post  Biometrics   Weight  178 lb 9.2 oz (81 kg)    BMI (Calculated)  28.6       Nutrition: Nutrition Therapy & Goals - 12/17/17 1340      Nutrition Therapy   Diet  heart healthy      Personal Nutrition Goals   Nutrition Goal  Pt to identify and limit food sources of saturated fat, trans fat, refined carbohydrates and sodium    Personal Goal #2  Pt to identify food quantities necessary to achieve weight loss of 6-24 lbs. at graduation from cardiac rehab. Goal wt loss of  15-20 lb desired.       Intervention Plan   Intervention  Prescribe, educate and counsel regarding individualized specific dietary modifications aiming towards targeted core components such as weight, hypertension, lipid management, diabetes, heart failure and other comorbidities.    Expected Outcomes  Short Term Goal: Understand basic principles of dietary content, such as calories, fat, sodium, cholesterol and nutrients.       Nutrition Discharge: Nutrition Assessments - 12/17/17 1337      MEDFICTS Scores   Pre Score  50    Post Score  --   pt did not return survey      Education Questionnaire Score: Knowledge Questionnaire Score - 09/25/17 1216      Knowledge Questionnaire Score   Pre Score  19/24       Zamira attended 12 exercise sessions between 09/25/17-11/05/17. Katara did well with exercise and left the program early to exercise on her own at the gym. Isaura said she felt more confident about exercising since she has been participating in phase 2 cardiac rehab.Barnet Pall, RN,BSN 12/18/2017 7:31 AM

## 2017-11-19 ENCOUNTER — Encounter (HOSPITAL_COMMUNITY): Payer: BLUE CROSS/BLUE SHIELD

## 2017-11-21 ENCOUNTER — Encounter (HOSPITAL_COMMUNITY): Payer: BLUE CROSS/BLUE SHIELD

## 2017-11-21 ENCOUNTER — Encounter: Payer: Self-pay | Admitting: Family Medicine

## 2017-11-21 DIAGNOSIS — I700 Atherosclerosis of aorta: Secondary | ICD-10-CM | POA: Diagnosis not present

## 2017-11-21 DIAGNOSIS — R110 Nausea: Secondary | ICD-10-CM | POA: Diagnosis not present

## 2017-11-21 DIAGNOSIS — Z955 Presence of coronary angioplasty implant and graft: Secondary | ICD-10-CM | POA: Diagnosis not present

## 2017-11-21 DIAGNOSIS — K5720 Diverticulitis of large intestine with perforation and abscess without bleeding: Secondary | ICD-10-CM | POA: Diagnosis not present

## 2017-11-21 DIAGNOSIS — R1030 Lower abdominal pain, unspecified: Secondary | ICD-10-CM | POA: Diagnosis not present

## 2017-11-21 DIAGNOSIS — Z7902 Long term (current) use of antithrombotics/antiplatelets: Secondary | ICD-10-CM | POA: Diagnosis not present

## 2017-11-21 DIAGNOSIS — Z800 Family history of malignant neoplasm of digestive organs: Secondary | ICD-10-CM | POA: Diagnosis not present

## 2017-11-21 DIAGNOSIS — D259 Leiomyoma of uterus, unspecified: Secondary | ICD-10-CM | POA: Diagnosis not present

## 2017-11-21 DIAGNOSIS — I2510 Atherosclerotic heart disease of native coronary artery without angina pectoris: Secondary | ICD-10-CM | POA: Diagnosis not present

## 2017-11-21 DIAGNOSIS — K5732 Diverticulitis of large intestine without perforation or abscess without bleeding: Secondary | ICD-10-CM | POA: Diagnosis not present

## 2017-11-21 DIAGNOSIS — R140 Abdominal distension (gaseous): Secondary | ICD-10-CM | POA: Diagnosis not present

## 2017-11-22 DIAGNOSIS — Z800 Family history of malignant neoplasm of digestive organs: Secondary | ICD-10-CM | POA: Diagnosis not present

## 2017-11-22 DIAGNOSIS — K5720 Diverticulitis of large intestine with perforation and abscess without bleeding: Secondary | ICD-10-CM | POA: Diagnosis not present

## 2017-11-22 DIAGNOSIS — R140 Abdominal distension (gaseous): Secondary | ICD-10-CM | POA: Diagnosis not present

## 2017-11-22 DIAGNOSIS — R110 Nausea: Secondary | ICD-10-CM | POA: Diagnosis not present

## 2017-11-24 ENCOUNTER — Telehealth: Payer: Self-pay | Admitting: Internal Medicine

## 2017-11-24 ENCOUNTER — Encounter (HOSPITAL_COMMUNITY): Payer: BLUE CROSS/BLUE SHIELD

## 2017-11-24 NOTE — Telephone Encounter (Signed)
Can call her though I was contacted by husband

## 2017-11-24 NOTE — Telephone Encounter (Signed)
Dx diverticulitis with microperforation Admitted x 24 hrs [facility]  Has disc/records  Needs f/u tomorrow please  Would use open hemorrhoid banding slot

## 2017-11-24 NOTE — Telephone Encounter (Signed)
Patient notified She will come in tomorrow at 3:15

## 2017-11-25 ENCOUNTER — Ambulatory Visit: Payer: BLUE CROSS/BLUE SHIELD | Admitting: Internal Medicine

## 2017-11-25 ENCOUNTER — Encounter: Payer: Self-pay | Admitting: Internal Medicine

## 2017-11-25 VITALS — BP 118/84 | HR 76 | Ht 66.0 in | Wt 181.0 lb

## 2017-11-25 DIAGNOSIS — K5732 Diverticulitis of large intestine without perforation or abscess without bleeding: Secondary | ICD-10-CM

## 2017-11-25 NOTE — Patient Instructions (Signed)
   Stay on a low fiber diet - contact me when antibiotics are done (sooner if needed)  We will plan the CT scan after that.  Give the disc to radiology when you go for the CT.   I appreciate the opportunity to care for you  Gatha Mayer, MD, [facility]

## 2017-11-25 NOTE — Progress Notes (Signed)
Nancy Sparks 52 y.o. 1965-12-01 [id number]  Assessment & Plan:   Encounter Diagnosis  Name Primary?  . Sigmoid diverticulitis Yes   Finish Abx She will update me after completes course Will plan on f/u CT in Nov will schedule after I know she is better (unless worse and needs reimaging for that)  Will be sure she has Abx and analgesics on hand - travelling to [place] in December  [id number], Fransico Him, MD   Subjective:   Chief Complaint: sigmoid diverticulitis  HPI 6 days ago started to have some LLQ and suprapubic pain - intensified over next 1-2 days and then became severe and doubled her over 4 days ago. Thinks she might have [facility] - was admitted overnight and got IV Abx and dc on Augmentin - she is improving. Has used Percocet only 2x. Eating soft bland diet Is having BM's  She had a normal white count and hemoglobin when she was at [facility], images and CT reports as well as discharge summary and surgical consultation notes reviewed Allergies  Allergen Reactions  . Latex Rash  . Tape Rash    Paper tape ok   Current Meds  Medication Sig  . ALPRAZolam (XANAX) 0.25 MG tablet Take 1 tablet (0.25 mg total) by mouth daily as needed for anxiety.  Marland Kitchen amoxicillin-clavulanate (AUGMENTIN) 875-125 MG tablet Take 1 tablet by mouth 2 (two) times daily.  Marland Kitchen aspirin EC 81 MG EC tablet Take 1 tablet (81 mg total) by mouth daily.  . clopidogrel (PLAVIX) 75 MG tablet Take 1 tablet (75 mg total) by mouth daily.  . Evolocumab (REPATHA SURECLICK) 939 MG/ML SOAJ Inject 1 pen into the skin every 14 (fourteen) days.  . isosorbide mononitrate (IMDUR) 30 MG 24 hr tablet Take 1 tablet (30 mg total) by mouth daily.  . metoprolol tartrate (LOPRESSOR) 50 MG tablet Take 1 tablet (50 mg total) by mouth 2 (two) times daily.  Marland Kitchen oxyCODONE-acetaminophen (PERCOCET/ROXICET) 5-325 MG tablet Take 1 tablet by mouth daily as  needed.  . rosuvastatin (CRESTOR) 40 MG tablet Take 1 tablet (40 mg total) by mouth at bedtime.  . STOOL SOFTENER 100 MG capsule Take 1 capsule by mouth 2 (two) times daily.  . Tetrahydrozoline HCl (VISINE OP) Place 1 drop into both eyes daily as needed (dry eyes).  . trazodone (DESYREL) 300 MG tablet Take 300 mg by mouth as directed.   Past Medical History:  Diagnosis Date  . Anemia    "w/pregnancies  . Anxiety    hx  . CAD (coronary artery disease)    LHC 6/19: pLAD, oD1 75, pRCA 20, mRCA 90 >> PCI: DES to RCA // Echo 6/19: EF 55-60, normal wall motion  . Complication of anesthesia    itching relieved with benadryl w/all my surgeries (07/24/2017)  . Depression    hx  . Dyslipidemia   . Fibroid   . GERD (gastroesophageal reflux disease)   . Heart murmur    hx  . Heart palpitations    hx of  . Hx of adenomatous polyp of colon 12/24/2002  . Hypertension    Past Surgical History:  Procedure Laterality Date  . BACK SURGERY    . [place]; 1998; 2001  . COLONOSCOPY    . CORONARY STENT INTERVENTION N/A 07/28/2017   Procedure: CORONARY STENT INTERVENTION;  Surgeon: Troy Sine, MD;  Location: [facility];  Service: Cardiovascular;  Laterality: N/A;  RCA  . LEFT HEART CATH AND CORONARY ANGIOGRAPHY N/A 07/28/2017   Procedure: LEFT HEART CATH AND CORONARY ANGIOGRAPHY;  Surgeon: Troy Sine, MD;  Location: [facility];  Service: Cardiovascular;  Laterality: N/A;  . LEFT HEART CATH AND CORONARY ANGIOGRAPHY N/A 09/04/2017   Procedure: LEFT HEART CATH AND CORONARY ANGIOGRAPHY;  Surgeon: Sherren Mocha, MD;  Location: [facility];  Service: Cardiovascular;  Laterality: N/A;  . LUMBAR LAMINECTOMY/DECOMPRESSION MICRODISCECTOMY  04/25/2011   Procedure: LUMBAR LAMINECTOMY/DECOMPRESSION MICRODISCECTOMY 1 LEVEL;  Surgeon: Elaina Hoops, MD;  Location: [facility];  Service: Neurosurgery;  Laterality: Left;  Left Lumbar Three-Four Laminectomy and Diskectomy  .  LUMBAR LAMINECTOMY/DECOMPRESSION MICRODISCECTOMY  04/26/2011   Procedure: LUMBAR LAMINECTOMY/DECOMPRESSION MICRODISCECTOMY 1 LEVEL;  Surgeon: Elaina Hoops, MD;  Location: [facility];  Service: Neurosurgery;  Laterality: N/A;  Re-Exploration of Lumbar Wound and Evacuation of Epidural Hematoma  . [place]  2006  . TUBAL LIGATION  2001   Social History   Social History Narrative   Married to Ferdinand   2 daughters and 1 son   Former Education officer, museum - homemaker now   6 drinks/week, no tobacco, no drug use      family history includes Anesthesia problems in her mother and sister; Atrial fibrillation in her father; Cancer in her sister; Colon cancer (age of onset: 67) in her sister; Coronary artery disease in her father; Diabetes in her father; Heart disease in her father; Hyperlipidemia in her brother, father, mother, and sister; Hypertension in her father and mother; Thyroid disease in her sister.   Review of Systems As above  Objective:   Physical Exam BP 118/84   Pulse 76   Ht 5\' 6"  (1.676 m)   Wt 181 lb (82.1 kg)   BMI 29.21 kg/m  NAD abd soft, mild LLQ/suprapubic tenderness   15 minutes time spent with patient > half in counseling coordination of care

## 2017-11-26 ENCOUNTER — Encounter (HOSPITAL_COMMUNITY): Payer: BLUE CROSS/BLUE SHIELD

## 2017-11-26 DIAGNOSIS — K5732 Diverticulitis of large intestine without perforation or abscess without bleeding: Secondary | ICD-10-CM | POA: Insufficient documentation

## 2017-11-28 ENCOUNTER — Encounter (HOSPITAL_COMMUNITY): Payer: BLUE CROSS/BLUE SHIELD

## 2017-12-01 ENCOUNTER — Encounter (HOSPITAL_COMMUNITY): Payer: BLUE CROSS/BLUE SHIELD

## 2017-12-01 DIAGNOSIS — F418 Other specified anxiety disorders: Secondary | ICD-10-CM | POA: Diagnosis not present

## 2017-12-01 DIAGNOSIS — Z6829 Body mass index (BMI) 29.0-29.9, adult: Secondary | ICD-10-CM | POA: Diagnosis not present

## 2017-12-01 DIAGNOSIS — Z8719 Personal history of other diseases of the digestive system: Secondary | ICD-10-CM | POA: Diagnosis not present

## 2017-12-03 ENCOUNTER — Encounter (HOSPITAL_COMMUNITY): Payer: BLUE CROSS/BLUE SHIELD

## 2017-12-05 ENCOUNTER — Encounter (HOSPITAL_COMMUNITY): Payer: BLUE CROSS/BLUE SHIELD

## 2017-12-08 ENCOUNTER — Encounter (HOSPITAL_COMMUNITY): Payer: BLUE CROSS/BLUE SHIELD

## 2017-12-10 ENCOUNTER — Encounter (HOSPITAL_COMMUNITY): Payer: BLUE CROSS/BLUE SHIELD

## 2017-12-12 ENCOUNTER — Encounter (HOSPITAL_COMMUNITY): Payer: BLUE CROSS/BLUE SHIELD

## 2017-12-15 ENCOUNTER — Encounter (HOSPITAL_COMMUNITY): Payer: BLUE CROSS/BLUE SHIELD

## 2017-12-15 ENCOUNTER — Other Ambulatory Visit: Payer: Self-pay | Admitting: Women's Health

## 2017-12-15 NOTE — Telephone Encounter (Signed)
Last filled on 03/18/17

## 2017-12-15 NOTE — Telephone Encounter (Signed)
Okay for refill?  

## 2017-12-17 ENCOUNTER — Encounter (HOSPITAL_COMMUNITY): Payer: BLUE CROSS/BLUE SHIELD

## 2017-12-18 NOTE — Progress Notes (Signed)
Thank you MCr 

## 2017-12-19 ENCOUNTER — Encounter (HOSPITAL_COMMUNITY): Payer: BLUE CROSS/BLUE SHIELD

## 2017-12-22 ENCOUNTER — Encounter (HOSPITAL_COMMUNITY): Payer: BLUE CROSS/BLUE SHIELD

## 2017-12-22 DIAGNOSIS — Z713 Dietary counseling and surveillance: Secondary | ICD-10-CM | POA: Diagnosis not present

## 2017-12-24 ENCOUNTER — Encounter (HOSPITAL_COMMUNITY): Payer: BLUE CROSS/BLUE SHIELD

## 2017-12-26 ENCOUNTER — Encounter (HOSPITAL_COMMUNITY): Payer: BLUE CROSS/BLUE SHIELD

## 2017-12-29 ENCOUNTER — Encounter (HOSPITAL_COMMUNITY): Payer: BLUE CROSS/BLUE SHIELD

## 2017-12-29 DIAGNOSIS — Z713 Dietary counseling and surveillance: Secondary | ICD-10-CM | POA: Diagnosis not present

## 2017-12-31 ENCOUNTER — Encounter (HOSPITAL_COMMUNITY): Payer: BLUE CROSS/BLUE SHIELD

## 2017-12-31 ENCOUNTER — Telehealth: Payer: Self-pay | Admitting: Internal Medicine

## 2017-12-31 DIAGNOSIS — K5732 Diverticulitis of large intestine without perforation or abscess without bleeding: Secondary | ICD-10-CM

## 2017-12-31 MED ORDER — AMOXICILLIN-POT CLAVULANATE 875-125 MG PO TABS: 1.0000 | ORAL_TABLET | Freq: Two times a day (BID) | ORAL | 0 refills | Status: DC

## 2017-12-31 NOTE — Telephone Encounter (Addendum)
Husband reports patient is doing well without pain.  She was considering coming back to see me prior to traveling [place] at [place].  She is feeling very well so I do not think that is necessary but I will send in a refill prescription for Augmentin to have on hand and she will report back to me if she needs it.  Does need a follow-up CT scan of the abdomen and pelvis which I did not discuss in these text messages back and forth but we will arrange.  I think she should have that before she goes to [place] in December just to make sure we have documentation of resolution.  We will contact her about that  Reason for CT is reassess diverticulitis after treatment  Patient has and is to bring disc of outside hospital CT scan

## 2018-01-02 ENCOUNTER — Encounter (HOSPITAL_COMMUNITY): Payer: BLUE CROSS/BLUE SHIELD

## 2018-01-05 NOTE — Telephone Encounter (Signed)
Left message for patient to call back  

## 2018-01-06 NOTE — Telephone Encounter (Signed)
Patient is out of town and needs to call me back tomorrow when she has her calendar

## 2018-01-08 NOTE — Telephone Encounter (Signed)
Patient is scheduled at [facility] radiology CT scan abd and pelvis on 01/16/18 1:30.  She is aware that she will need to go by the radiology department by 01/15/18 to pick up the contrast.

## 2018-01-16 ENCOUNTER — Inpatient Hospital Stay
Admission: RE | Admit: 2018-01-16 | Discharge: 2018-01-16 | Disposition: A | Discharge status: Home / Self Care | Payer: Self-pay | Source: Ambulatory Visit | Attending: Internal Medicine | Admitting: Internal Medicine

## 2018-01-16 ENCOUNTER — Ambulatory Visit (HOSPITAL_COMMUNITY)
Admission: RE | Admit: 2018-01-16 | Discharge: 2018-01-16 | Disposition: A | Discharge status: Home / Self Care | Payer: BLUE CROSS/BLUE SHIELD | Source: Ambulatory Visit | Attending: Internal Medicine | Admitting: Internal Medicine

## 2018-01-16 ENCOUNTER — Other Ambulatory Visit: Payer: Self-pay | Admitting: Internal Medicine

## 2018-01-16 DIAGNOSIS — C801 Malignant (primary) neoplasm, unspecified: Secondary | ICD-10-CM

## 2018-01-16 DIAGNOSIS — K5732 Diverticulitis of large intestine without perforation or abscess without bleeding: Secondary | ICD-10-CM | POA: Diagnosis not present

## 2018-01-16 MED ORDER — IOHEXOL 300 MG/ML  SOLN
100.0000 mL | Freq: Once | INTRAMUSCULAR | Status: AC | PRN
  Administered 2018-01-16: 100 mL via INTRAVENOUS

## 2018-01-16 MED ORDER — IOPAMIDOL (ISOVUE-370) INJECTION 76%
INTRAVENOUS | Status: AC
  Filled 2018-01-16: qty 100

## 2018-01-16 MED ORDER — SODIUM CHLORIDE (PF) 0.9 % IJ SOLN
INTRAMUSCULAR | Status: AC
  Filled 2018-01-16: qty 50

## 2018-01-16 MED ORDER — AMOXICILLIN-POT CLAVULANATE 875-125 MG PO TABS: 1.0000 | ORAL_TABLET | Freq: Two times a day (BID) | ORAL | 0 refills | Status: DC

## 2018-01-16 NOTE — Progress Notes (Signed)
Mild diverticulitis by CT Called patient - mild LLQ pain Going to [place] in about 5 days  Advised take another course Augmentin 875 mg bid x 10 days  Contact me on return and after Rx w/ update

## 2018-01-20 ENCOUNTER — Telehealth: Payer: Self-pay | Admitting: Internal Medicine

## 2018-01-20 MED ORDER — FLUCONAZOLE 150 MG PO TABS: ORAL_TABLET | ORAL | 0 refills | Status: DC

## 2018-01-20 NOTE — Telephone Encounter (Signed)
Concerned that Augmentin may cause yeast infection Rx w/ fluconazole 150 mg

## 2018-01-21 ENCOUNTER — Emergency Department (HOSPITAL_COMMUNITY)
Admission: EM | Admit: 2018-01-21 | Discharge: 2018-01-21 | Disposition: A | Discharge status: Home / Self Care | Payer: BLUE CROSS/BLUE SHIELD | Attending: Emergency Medicine | Admitting: Emergency Medicine

## 2018-01-21 ENCOUNTER — Encounter (HOSPITAL_COMMUNITY): Payer: Self-pay | Admitting: Emergency Medicine

## 2018-01-21 DIAGNOSIS — S0501XA Injury of conjunctiva and corneal abrasion without foreign body, right eye, initial encounter: Secondary | ICD-10-CM

## 2018-01-21 DIAGNOSIS — Z87891 Personal history of nicotine dependence: Secondary | ICD-10-CM | POA: Diagnosis not present

## 2018-01-21 DIAGNOSIS — Z91040 Latex allergy status: Secondary | ICD-10-CM | POA: Insufficient documentation

## 2018-01-21 DIAGNOSIS — Z7982 Long term (current) use of aspirin: Secondary | ICD-10-CM | POA: Insufficient documentation

## 2018-01-21 DIAGNOSIS — Z79899 Other long term (current) drug therapy: Secondary | ICD-10-CM | POA: Diagnosis not present

## 2018-01-21 DIAGNOSIS — I10 Essential (primary) hypertension: Secondary | ICD-10-CM | POA: Insufficient documentation

## 2018-01-21 DIAGNOSIS — I2510 Atherosclerotic heart disease of native coronary artery without angina pectoris: Secondary | ICD-10-CM | POA: Insufficient documentation

## 2018-01-21 DIAGNOSIS — H1131 Conjunctival hemorrhage, right eye: Secondary | ICD-10-CM | POA: Diagnosis not present

## 2018-01-21 DIAGNOSIS — H5711 Ocular pain, right eye: Secondary | ICD-10-CM | POA: Diagnosis not present

## 2018-01-21 MED ORDER — TETANUS-DIPHTH-ACELL PERTUSSIS 5-2.5-18.5 LF-MCG/0.5 IM SUSP
0.5000 mL | Freq: Once | INTRAMUSCULAR | Status: AC
  Administered 2018-01-21: 0.5 mL via INTRAMUSCULAR
  Filled 2018-01-21: qty 0.5

## 2018-01-21 MED ORDER — FLUORESCEIN SODIUM 1 MG OP STRP
1.0000 | ORAL_STRIP | Freq: Once | OPHTHALMIC | Status: AC
  Administered 2018-01-21: 1 via OPHTHALMIC
  Filled 2018-01-21: qty 1

## 2018-01-21 MED ORDER — TETRACAINE HCL 0.5 % OP SOLN
2.0000 [drp] | Freq: Once | OPHTHALMIC | Status: AC
  Administered 2018-01-21: 2 [drp] via OPHTHALMIC
  Filled 2018-01-21: qty 4

## 2018-01-21 MED ORDER — ERYTHROMYCIN 5 MG/GM OP OINT: TOPICAL_OINTMENT | OPHTHALMIC | 0 refills | Status: DC

## 2018-01-21 NOTE — ED Notes (Signed)
Patient verbalizes understanding of discharge instructions. Opportunity for questioning and answers were provided. Pt discharged from ED. 

## 2018-01-21 NOTE — ED Provider Notes (Signed)
[place] EMERGENCY DEPARTMENT Provider Note   CSN: [id number] Arrival date & time: 01/21/18  1001     History   Chief Complaint Chief Complaint  Patient presents with  . Facial Injury    HPI Nancy Sparks is a 52 y.o. female with a hx of CAD on plavix w/ stent placement, anxiety, depression, dyslipidemia, and HTN who presents to the ED for R eye injury which occurred approximately 30 minutes PTA. Patient states that she was playing tennis volleying the ball back and forth when the ball hit her racket face and popped up striking her in the R eye. States since injury she has had eye pain, FB sensation, and some blurry vision. She noted blood to lateral eye. Overall sxs are improving since injury. Current discomfort is a 3-4 out of 10 without intervention PTA. Denies LOC, other areas of injury, photophobia, purulent drainage, nausea, or vomiting. She is NOT a contact lens wearer. Last tetanus unknown.   HPI  Past Medical History:  Diagnosis Date  . Anemia    "w/pregnancies  . Anxiety    hx  . CAD (coronary artery disease)    LHC 6/19: pLAD, oD1 75, pRCA 20, mRCA 90 >> PCI: DES to RCA // Echo 6/19: EF 55-60, normal wall motion  . Complication of anesthesia    itching relieved with benadryl w/all my surgeries (07/24/2017)  . Depression    hx  . Dyslipidemia   . Fibroid   . GERD (gastroesophageal reflux disease)   . Heart murmur    hx  . Heart palpitations    hx of  . Hx of adenomatous polyp of colon 12/24/2002  . Hypertension     Patient Active Problem List   Diagnosis Date Noted  . Sigmoid diverticulitis 11/26/2017  . Heterozygous familial hypercholesterolemia 11/02/2017  . Coronary artery disease involving native coronary artery with angina pectoris ([place])   . HLD (hyperlipidemia) 07/24/2017  . Libido, decreased 01/01/2016  . Hot flashes 01/01/2016  . Insomnia 01/01/2016  . HTN (hypertension) 01/03/2015  . Family history of colon cancer - sister  04/24/2014  . OAB (overactive bladder) 11/04/2013  . Heart palpitations   . Hx of adenomatous polyp of colon 12/24/2002    Past Surgical History:  Procedure Laterality Date  . BACK SURGERY    . [place]; 1998; 2001  . COLONOSCOPY    . CORONARY STENT INTERVENTION N/A 07/28/2017   Procedure: CORONARY STENT INTERVENTION;  Surgeon: Troy Sine, MD;  Location: [place] [facility];  Service: Cardiovascular;  Laterality: N/A;  RCA  . LEFT HEART CATH AND CORONARY ANGIOGRAPHY N/A 07/28/2017   Procedure: LEFT HEART CATH AND CORONARY ANGIOGRAPHY;  Surgeon: Troy Sine, MD;  Location: [place] [facility];  Service: Cardiovascular;  Laterality: N/A;  . LEFT HEART CATH AND CORONARY ANGIOGRAPHY N/A 09/04/2017   Procedure: LEFT HEART CATH AND CORONARY ANGIOGRAPHY;  Surgeon: Sherren Mocha, MD;  Location: [place] [facility];  Service: Cardiovascular;  Laterality: N/A;  . LUMBAR LAMINECTOMY/DECOMPRESSION MICRODISCECTOMY  04/25/2011   Procedure: LUMBAR LAMINECTOMY/DECOMPRESSION MICRODISCECTOMY 1 LEVEL;  Surgeon: Elaina Hoops, MD;  Location: [place] [facility];  Service: Neurosurgery;  Laterality: Left;  Left Lumbar Three-Four Laminectomy and Diskectomy  . LUMBAR LAMINECTOMY/DECOMPRESSION MICRODISCECTOMY  04/26/2011   Procedure: LUMBAR LAMINECTOMY/DECOMPRESSION MICRODISCECTOMY 1 LEVEL;  Surgeon: Elaina Hoops, MD;  Location: [place] [facility];  Service: Neurosurgery;  Laterality: N/A;  Re-Exploration of Lumbar Wound and Evacuation of Epidural Hematoma  .  [place]  2006  . TUBAL LIGATION  2001     OB History    Gravida  4   Para  3   Term  3   Preterm      AB  1   Living  3     SAB      TAB      Ectopic      Multiple      Live Births               Home Medications    Prior to Admission medications   Medication Sig Start Date End Date Taking? Authorizing Provider  ALPRAZolam (XANAX) 0.25 MG tablet Take 1 tablet (0.25 mg total) by mouth daily as needed for anxiety.  09/03/17   Richardson Dopp T, PA-C  amLODipine (NORVASC) 2.5 MG tablet Take 1 tablet (2.5 mg total) by mouth daily. 08/12/17 11/10/17  Richardson Dopp T, PA-C  amoxicillin-clavulanate (AUGMENTIN) 875-125 MG tablet Take 1 tablet by mouth 2 (two) times daily. If diverticulitis recurs 01/16/18   Gatha Mayer, MD  aspirin EC 81 MG EC tablet Take 1 tablet (81 mg total) by mouth daily. 07/29/17   Daune Perch, NP  clopidogrel (PLAVIX) 75 MG tablet Take 1 tablet (75 mg total) by mouth daily. 09/03/17   Kathlen Mody, Scott T, PA-C  Evolocumab (REPATHA SURECLICK) 329 MG/ML SOAJ Inject 1 pen into the skin every 14 (fourteen) days. 09/08/17   Croitoru, Mihai, MD  fluconazole (DIFLUCAN) 150 MG tablet One pill as directed for yeast infection - repeat x 1 if necessary 01/20/18   Gatha Mayer, MD  isosorbide mononitrate (IMDUR) 30 MG 24 hr tablet Take 1 tablet (30 mg total) by mouth daily. 07/29/17   Daune Perch, NP  metoprolol tartrate (LOPRESSOR) 50 MG tablet Take 1 tablet (50 mg total) by mouth 2 (two) times daily. 09/03/17 12/02/17  Richardson Dopp T, PA-C  nitroGLYCERIN (NITROSTAT) 0.4 MG SL tablet Place 1 tablet (0.4 mg total) under the tongue every 5 (five) minutes as needed for chest pain. 07/31/17 10/30/17  Croitoru, Mihai, MD  oxyCODONE-acetaminophen (PERCOCET/ROXICET) 5-325 MG tablet Take 1 tablet by mouth daily as needed. 11/22/17   [provider]  rosuvastatin (CRESTOR) 40 MG tablet Take 1 tablet (40 mg total) by mouth at bedtime. 07/29/17   Daune Perch, NP  STOOL SOFTENER 100 MG capsule Take 1 capsule by mouth 2 (two) times daily. 11/22/17   [provider]  Tetrahydrozoline HCl (VISINE OP) Place 1 drop into both eyes daily as needed (dry eyes).    [provider]  trazodone (DESYREL) 300 MG tablet TAKE 1/2 TO 1 TABLET BY MOUTH EVERY NIGHT AT BEDTIME 12/15/17   Huel Cote, NP    Family History Family History  Problem Relation Age of Onset  . Hyperlipidemia Mother   .  Hypertension Mother   . Anesthesia problems Mother        nausea  . Coronary artery disease Father        CABGx3 age 15  . Hyperlipidemia Father   . Atrial fibrillation Father   . Hypertension Father   . Heart disease Father   . Diabetes Father   . Hyperlipidemia Sister        X71  . Cancer Sister        colon cancer  . Thyroid disease Sister   . Anesthesia problems Sister        nausea  . Colon cancer Sister 16  .  Hyperlipidemia Brother        x2    Social History Social History   Tobacco Use  . Smoking status: Former Smoker    Packs/day: 0.10    Years: 2.00    Pack years: 0.20    Types: Cigarettes  . Smokeless tobacco: Never Used  . Tobacco comment: quit smoking in college  Substance Use Topics  . Alcohol use: Yes    Alcohol/week: 6.0 standard drinks    Types: 6 Glasses of wine per week  . Drug use: Not Currently    Types: Marijuana    Comment: 07/24/2017 "in college"     Allergies   Latex and Tape   Review of Systems Review of Systems  Constitutional: Negative for chills and fever.  Eyes: Positive for pain, redness and visual disturbance. Negative for photophobia and discharge.  Gastrointestinal: Negative for nausea and vomiting.  Neurological: Negative for syncope.  All other systems reviewed and are negative.    Physical Exam Updated Vital Signs BP 138/87   Pulse 79   Temp 97.9 F (36.6 C) (Oral)   Resp 17   SpO2 97%   Physical Exam Vitals signs and nursing note reviewed.  Constitutional:      General: She is not in acute distress.    Appearance: She is well-developed.  HENT:     Head: Normocephalic.     Right Ear: Tympanic membrane normal.     Left Ear: Tympanic membrane normal.     Nose: Nose normal.     Mouth/Throat:     Mouth: Mucous membranes are moist.     Pharynx: Uvula midline.  Eyes:     General: Lids are everted, no foreign bodies appreciated. Vision grossly intact.        Right eye: No discharge.        Left eye: No  discharge.     Extraocular Movements: Extraocular movements intact.     Conjunctiva/sclera:     Right eye: Hemorrhage (approximately 0.5 cm to lateral position of the conjunctiva ) present. No chemosis or exudate.    Left eye: No chemosis, exudate or hemorrhage.    Comments: PERRL.  No periorbital erythema/edema/ecchymosis, or tenderness to palpation. No proptosis. No evidence of entrapment. Visual Acuity: Bilateral Distance: 20/32 R Distance: 20/40 L Distance: 20/32 Fluorescein stain: Patient has multiple small linear corneal abrasions to the lateral aspect of the cornea, each < 3-4 mm. No ulceration. No dendritic stain. No evidence of hyphema. Negative Seidel.   Cardiovascular:     Rate and Rhythm: Normal rate and regular rhythm.  Pulmonary:     Effort: Pulmonary effort is normal. No respiratory distress.  Neurological:     Mental Status: She is alert.     Comments: Clear speech.   Psychiatric:        Behavior: Behavior normal.        Thought Content: Thought content normal.      ED Treatments / Results  Labs (all labs ordered are listed, but only abnormal results are displayed) Labs Reviewed - No data to display  EKG None  Radiology No results found.  Procedures Procedures (including critical care time)  Medications Ordered in ED Medications  fluorescein ophthalmic strip 1 strip (1 strip Right Eye Given by Other 01/21/18 1125)  tetracaine (PONTOCAINE) 0.5 % ophthalmic solution 2 drop (2 drops Right Eye Given by Other 01/21/18 1125)  Tdap (BOOSTRIX) injection 0.5 mL (0.5 mLs Intramuscular Given 01/21/18 1126)     Initial Impression /  Assessment and Plan / ED Course  I have reviewed the triage vital signs and the nursing notes.  Pertinent labs & imaging results that were available during my care of the patient were reviewed by me and considered in my medical decision making (see chart for details).   Patient presents to the ED s/p R eye injury which occurred 30  minutes PTA- struck by tennis ball. Patient nontoxic appearing, vitals WNL. Exam with conjunctival hemorrhage & corneal abrasions. Visual acuity without significant deficits. Patient is not a contact lens wearer. No periorbital ecchymosis/swelling/tenderness to raise concern for orbital fracture, no evidence of entrapment. Negative seidel- doubt globe rupture. No hyphema noted. Eye irrigated, no evidence of FB on exam. Tetanus updated. Will place on erythromycin ointment w/ ophthalmology follow up. I discussed  treatment plan, need for follow-up, and return precautions with the patient. Provided opportunity for questions, patient confirmed understanding and is in agreement with plan.    Findings and plan of care discussed with supervising physician Dr. Francia Greaves who personally evaluated and examined this patient and is in agreement.   Final Clinical Impressions(s) / ED Diagnoses   Final diagnoses:  Abrasion of right cornea, initial encounter  Subconjunctival hemorrhage of right eye    ED Discharge Orders         Ordered    erythromycin ophthalmic ointment     01/21/18 [address] [place], PA-C 01/21/18 1144    Valarie Merino, MD 01/21/18 1510

## 2018-01-21 NOTE — ED Triage Notes (Signed)
Pt reports being hit in right side of face/eye with tennis ball. Reports blurred vision in right eye with some improvement. Pt takes generic plavix and ASA due to having a stent placed this summer.

## 2018-01-21 NOTE — Discharge Instructions (Addendum)
You are seen in the ER today for an eye injury.  Your physical exam shows you have a corneal abrasion as well as a conjunctival hemorrhage.  We are placing you on erythromycin ointment, an antibiotic ointment for the eye, to help prevent infection.  Please use this as prescribed.  We have also recommend you take Tylenol per over-the-counter dosing instructions to help with discomfort.  Please follow-up with an ophthalmologist or your primary care provider.  Return to the ER or go to another ER on your trip should you experience worsening pain, change in your vision, swelling of the eye, drainage from the eye, fever, or any other concerns.

## 2018-02-01 ENCOUNTER — Other Ambulatory Visit: Payer: Self-pay | Admitting: Women's Health

## 2018-02-02 NOTE — Telephone Encounter (Signed)
Okay for refill, I think she takes half tablet.  Reviewed best not to take daily.

## 2018-02-03 DIAGNOSIS — H43811 Vitreous degeneration, right eye: Secondary | ICD-10-CM | POA: Diagnosis not present

## 2018-02-09 DIAGNOSIS — Z713 Dietary counseling and surveillance: Secondary | ICD-10-CM | POA: Diagnosis not present

## 2018-02-16 DIAGNOSIS — Z713 Dietary counseling and surveillance: Secondary | ICD-10-CM | POA: Diagnosis not present

## 2018-02-19 DIAGNOSIS — K219 Gastro-esophageal reflux disease without esophagitis: Secondary | ICD-10-CM | POA: Diagnosis not present

## 2018-02-19 DIAGNOSIS — F418 Other specified anxiety disorders: Secondary | ICD-10-CM | POA: Diagnosis not present

## 2018-02-19 DIAGNOSIS — R7301 Impaired fasting glucose: Secondary | ICD-10-CM | POA: Diagnosis not present

## 2018-02-19 DIAGNOSIS — E7800 Pure hypercholesterolemia, unspecified: Secondary | ICD-10-CM | POA: Diagnosis not present

## 2018-02-19 DIAGNOSIS — R82998 Other abnormal findings in urine: Secondary | ICD-10-CM | POA: Diagnosis not present

## 2018-02-19 DIAGNOSIS — I2510 Atherosclerotic heart disease of native coronary artery without angina pectoris: Secondary | ICD-10-CM | POA: Diagnosis not present

## 2018-02-23 DIAGNOSIS — Z713 Dietary counseling and surveillance: Secondary | ICD-10-CM | POA: Diagnosis not present

## 2018-03-02 DIAGNOSIS — Z713 Dietary counseling and surveillance: Secondary | ICD-10-CM | POA: Diagnosis not present

## 2018-03-23 ENCOUNTER — Ambulatory Visit: Payer: BLUE CROSS/BLUE SHIELD | Admitting: Women's Health

## 2018-03-23 ENCOUNTER — Telehealth: Payer: Self-pay | Admitting: Cardiovascular Disease

## 2018-03-23 ENCOUNTER — Encounter: Payer: Self-pay | Admitting: Women's Health

## 2018-03-23 VITALS — BP 160/84 | Ht 66.0 in | Wt 180.0 lb

## 2018-03-23 DIAGNOSIS — Z01419 Encounter for gynecological examination (general) (routine) without abnormal findings: Secondary | ICD-10-CM | POA: Diagnosis not present

## 2018-03-23 DIAGNOSIS — Z13820 Encounter for screening for osteoporosis: Secondary | ICD-10-CM

## 2018-03-23 NOTE — Progress Notes (Signed)
Nancy Sparks May 21, 1965 [id number]    History:    Presents for annual exam.  Postmenopausal on no HRT.  Normal Pap and mammogram history.  07/2017 had a cardiac cath that showed 90% blockage of artery - stent placed.  Doing better, states always gets a little nervous coming here, blood pressure elevated today.  Has had increased cholesterol, hypertension for years.  Unable to get cholesterol down with Crestor had to add Repatha.  2017 colonoscopy.  01/2018 diverticulitis hospitalized for 1 day.  Past medical history, past surgical history, family history and social history were all reviewed and documented in the EPIC chart.  Retired Pharmacist, hospital.  3 children oldest daughter 17 getting married, second daughter 17 in nursing school at [employer] and son senior in high school unsure if going to [place], [place] state or [place].  Sister history of colon cancer.  Parents hypertension, hypercholesteremia, father diabetes both deceased in their 45s.  7 siblings.  ROS:  A ROS was performed and pertinent positives and negatives are included.  Exam:  Vitals:   03/23/18 1407  BP: (!) 160/84  Weight: 180 lb (81.6 kg)  Height: 5\' 6"  (1.676 m)   Body mass index is 29.05 kg/m.   General appearance:  Normal Thyroid:  Symmetrical, normal in size, without palpable masses or nodularity. Respiratory  Auscultation:  Clear without wheezing or rhonchi Cardiovascular  Auscultation:  Regular rate, without rubs, murmurs or gallops  Edema/varicosities:  Not grossly evident Abdominal  Soft,nontender, without masses, guarding or rebound.  Liver/spleen:  No organomegaly noted  Hernia:  None appreciated  Skin  Inspection:  Grossly normal   Breasts: Examined lying and sitting.     Right: Without masses, retractions, discharge or axillary adenopathy.     Left: Without masses, retractions, discharge or axillary adenopathy. Gentitourinary   Inguinal/mons:  Normal without inguinal adenopathy  External genitalia:   Normal  BUS/Urethra/Skene's glands:  Normal  Vagina:  Normal  Cervix:  Normal  Uterus: Fibroid uterus 8 weeks size nontender, shape and contour.  Midline and mobile  Adnexa/parametria:     Rt: Without masses or tenderness.   Lt: Without masses or tenderness.  Anus and perineum: Normal  Digital rectal exam: Normal sphincter tone without palpated masses or tenderness  Assessment/Plan:  53 y.o. MWF G3, P3 for annual exam with no GYN complaints, occasional hot flushes.  Postmenopausal on no HRT with no bleeding 5 cm fibroid uterus Hypertension, CAD,  hypercholesteremia-primary care/cardiologist manage labs and meds..  Plan: Instructed to follow-up with neurologist regarding blood pressure today as well as states feels her eyes are puffy.  Instructed to check blood pressure at home on a regular basis and report to cardiologist.  Vitamin E twice daily for hot flushes.  SBEs, continue annual screening mammogram.  Instructed to continue active lifestyle exercises 4-5 times weekly, plays tennis, healthy diet, decrease calorie/carbs encouraged.  Vitamin D 2000 daily.  Schedule bone density.  Pap normal 2019, new screening guidelines reviewed.   [place], 3:57 PM 03/23/2018

## 2018-03-23 NOTE — Telephone Encounter (Signed)
New message   Pt c/o BP issue: STAT if pt c/o blurred vision, one-sided weakness or slurred speech  1. What are your last 5 BP readings? 160/84 bp on 03/23/2018  2. Are you having any other symptoms (ex. Dizziness, headache, blurred vision, passed out)? Headache, face is puffy  3. What is your BP issue? Bp is elevated

## 2018-03-23 NOTE — Patient Instructions (Signed)
Health Maintenance for Postmenopausal Women Menopause is a normal process in which your reproductive ability comes to an end. This process happens gradually over a span of months to years, usually between the ages of 62 and 89. Menopause is complete when you have missed 12 consecutive menstrual periods. It is important to talk with your health care provider about some of the most common conditions that affect postmenopausal women, such as heart disease, cancer, and bone loss (osteoporosis). Adopting a healthy lifestyle and getting preventive care can help to promote your health and wellness. Those actions can also lower your chances of developing some of these common conditions. What should I know about menopause? During menopause, you may experience a number of symptoms, such as:  Moderate-to-severe hot flashes.  Night sweats.  Decrease in sex drive.  Mood swings.  Headaches.  Tiredness.  Irritability.  Memory problems.  Insomnia. Choosing to treat or not to treat menopausal changes is an individual decision that you make with your health care provider. What should I know about hormone replacement therapy and supplements? Hormone therapy products are effective for treating symptoms that are associated with menopause, such as hot flashes and night sweats. Hormone replacement carries certain risks, especially as you become older. If you are thinking about using estrogen or estrogen with progestin treatments, discuss the benefits and risks with your health care provider. What should I know about heart disease and stroke? Heart disease, heart attack, and stroke become more likely as you age. This may be due, in part, to the hormonal changes that your body experiences during menopause. These can affect how your body processes dietary fats, triglycerides, and cholesterol. Heart attack and stroke are both medical emergencies. There are many things that you can do to help prevent heart disease  and stroke:  Have your blood pressure checked at least every 1-2 years. High blood pressure causes heart disease and increases the risk of stroke.  If you are 79-72 years old, ask your health care provider if you should take aspirin to prevent a heart attack or a stroke.  Do not use any tobacco products, including cigarettes, chewing tobacco, or electronic cigarettes. If you need help quitting, ask your health care provider.  It is important to eat a healthy diet and maintain a healthy weight. ? Be sure to include plenty of vegetables, fruits, low-fat dairy products, and lean protein. ? Avoid eating foods that are high in solid fats, added sugars, or salt (sodium).  Get regular exercise. This is one of the most important things that you can do for your health. ? Try to exercise for at least 150 minutes each week. The type of exercise that you do should increase your heart rate and make you sweat. This is known as moderate-intensity exercise. ? Try to do strengthening exercises at least twice each week. Do these in addition to the moderate-intensity exercise.  Know your numbers.Ask your health care provider to check your cholesterol and your blood glucose. Continue to have your blood tested as directed by your health care provider.  What should I know about cancer screening? There are several types of cancer. Take the following steps to reduce your risk and to catch any cancer development as early as possible. Breast Cancer  Practice breast self-awareness. ? This means understanding how your breasts normally appear and feel. ? It also means doing regular breast self-exams. Let your health care provider know about any changes, no matter how small.  If you are 40 or  older, have a clinician do a breast exam (clinical breast exam or CBE) every year. Depending on your age, family history, and medical history, it may be recommended that you also have a yearly breast X-ray (mammogram).  If you  have a family history of breast cancer, talk with your health care provider about genetic screening.  If you are at high risk for breast cancer, talk with your health care provider about having an MRI and a mammogram every year.  Breast cancer (BRCA) gene test is recommended for women who have family members with BRCA-related cancers. Results of the assessment will determine the need for genetic counseling and BRCA1 and for BRCA2 testing. BRCA-related cancers include these types: ? Breast. This occurs in males or females. ? Ovarian. ? Tubal. This may also be called fallopian tube cancer. ? Cancer of the abdominal or pelvic lining (peritoneal cancer). ? Prostate. ? Pancreatic. Cervical, Uterine, and Ovarian Cancer Your health care provider may recommend that you be screened regularly for cancer of the pelvic organs. These include your ovaries, uterus, and vagina. This screening involves a pelvic exam, which includes checking for microscopic changes to the surface of your cervix (Pap test).  For women ages 21-65, health care providers may recommend a pelvic exam and a Pap test every three years. For women ages 39-65, they may recommend the Pap test and pelvic exam, combined with testing for human papilloma virus (HPV), every five years. Some types of HPV increase your risk of cervical cancer. Testing for HPV may also be done on women of any age who have unclear Pap test results.  Other health care providers may not recommend any screening for nonpregnant women who are considered low risk for pelvic cancer and have no symptoms. Ask your health care provider if a screening pelvic exam is right for you.  If you have had past treatment for cervical cancer or a condition that could lead to cancer, you need Pap tests and screening for cancer for at least 20 years after your treatment. If Pap tests have been discontinued for you, your risk factors (such as having a new sexual partner) need to be reassessed  to determine if you should start having screenings again. Some women have medical problems that increase the chance of getting cervical cancer. In these cases, your health care provider may recommend that you have screening and Pap tests more often.  If you have a family history of uterine cancer or ovarian cancer, talk with your health care provider about genetic screening.  If you have vaginal bleeding after reaching menopause, tell your health care provider.  There are currently no reliable tests available to screen for ovarian cancer. Lung Cancer Lung cancer screening is recommended for adults 57-50 years old who are at high risk for lung cancer because of a history of smoking. A yearly low-dose CT scan of the lungs is recommended if you:  Currently smoke.  Have a history of at least 30 pack-years of smoking and you currently smoke or have quit within the past 15 years. A pack-year is smoking an average of one pack of cigarettes per day for one year. Yearly screening should:  Continue until it has been 15 years since you quit.  Stop if you develop a health problem that would prevent you from having lung cancer treatment. Colorectal Cancer  This type of cancer can be detected and can often be prevented.  Routine colorectal cancer screening usually begins at age 12 and continues through  age 75.  If you have risk factors for colon cancer, your health care provider may recommend that you be screened at an earlier age.  If you have a family history of colorectal cancer, talk with your health care provider about genetic screening.  Your health care provider may also recommend using home test kits to check for hidden blood in your stool.  A small camera at the end of a tube can be used to examine your colon directly (sigmoidoscopy or colonoscopy). This is done to check for the earliest forms of colorectal cancer.  Direct examination of the colon should be repeated every 5-10 years until  age 75. However, if early forms of precancerous polyps or small growths are found or if you have a family history or genetic risk for colorectal cancer, you may need to be screened more often. Skin Cancer  Check your skin from head to toe regularly.  Monitor any moles. Be sure to tell your health care provider: ? About any new moles or changes in moles, especially if there is a change in a mole's shape or color. ? If you have a mole that is larger than the size of a pencil eraser.  If any of your family members has a history of skin cancer, especially at a young age, talk with your health care provider about genetic screening.  Always use sunscreen. Apply sunscreen liberally and repeatedly throughout the day.  Whenever you are outside, protect yourself by wearing long sleeves, pants, a wide-brimmed hat, and sunglasses. What should I know about osteoporosis? Osteoporosis is a condition in which bone destruction happens more quickly than new bone creation. After menopause, you may be at an increased risk for osteoporosis. To help prevent osteoporosis or the bone fractures that can happen because of osteoporosis, the following is recommended:  If you are 19-50 years old, get at least 1,000 mg of calcium and at least 600 mg of vitamin D per day.  If you are older than age 50 but younger than age 70, get at least 1,200 mg of calcium and at least 600 mg of vitamin D per day.  If you are older than age 70, get at least 1,200 mg of calcium and at least 800 mg of vitamin D per day. Smoking and excessive alcohol intake increase the risk of osteoporosis. Eat foods that are rich in calcium and vitamin D, and do weight-bearing exercises several times each week as directed by your health care provider. What should I know about how menopause affects my mental health? Depression may occur at any age, but it is more common as you become older. Common symptoms of depression include:  Low or sad  mood.  Changes in sleep patterns.  Changes in appetite or eating patterns.  Feeling an overall lack of motivation or enjoyment of activities that you previously enjoyed.  Frequent crying spells. Talk with your health care provider if you think that you are experiencing depression. What should I know about immunizations? It is important that you get and maintain your immunizations. These include:  Tetanus, diphtheria, and pertussis (Tdap) booster vaccine.  Influenza every year before the flu season begins.  Pneumonia vaccine.  Shingles vaccine. Your health care provider may also recommend other immunizations. This information is not intended to replace advice given to you by your health care provider. Make sure you discuss any questions you have with your health care provider. Document Released: 03/15/2005 Document Revised: 08/11/2015 Document Reviewed: 10/25/2014 [employer]vier Interactive Patient Education    2019 [employer].

## 2018-03-23 NOTE — Telephone Encounter (Signed)
Pt states over the weekend she hadn't been feeling great. She report yesterday her husband noticed her eye lids are little more puffy and she report having a headache located on the back left side. She state she was seen by her OBGYN today and BP was 160/84. She denies any facial drop or other symptoms. Appointment made for tomorrow 2/18 at 930 am with Almyra Deforest, PA for further evaluations.

## 2018-03-24 ENCOUNTER — Ambulatory Visit: Payer: BLUE CROSS/BLUE SHIELD | Admitting: Physician Assistant

## 2018-03-24 ENCOUNTER — Encounter: Payer: Self-pay | Admitting: Physician Assistant

## 2018-03-24 VITALS — Ht 66.0 in | Wt 179.2 lb

## 2018-03-24 DIAGNOSIS — I10 Essential (primary) hypertension: Secondary | ICD-10-CM | POA: Diagnosis not present

## 2018-03-24 DIAGNOSIS — I493 Ventricular premature depolarization: Secondary | ICD-10-CM

## 2018-03-24 DIAGNOSIS — E785 Hyperlipidemia, unspecified: Secondary | ICD-10-CM

## 2018-03-24 DIAGNOSIS — I2510 Atherosclerotic heart disease of native coronary artery without angina pectoris: Secondary | ICD-10-CM

## 2018-03-24 DIAGNOSIS — R220 Localized swelling, mass and lump, head: Secondary | ICD-10-CM

## 2018-03-24 MED ORDER — ISOSORBIDE MONONITRATE ER 60 MG PO TB24: 60.0000 mg | ORAL_TABLET | Freq: Every day | ORAL | 1 refills | Status: DC

## 2018-03-24 NOTE — Patient Instructions (Signed)
Medication Instructions:   INCREASE IMDUR TO 60 MG DAILY   If you need a refill on your cardiac medications before your next appointment, please call your pharmacy.   Lab work: NONE  If you have labs (blood work) drawn today and your tests are completely normal, you will receive your results only by: [facility] MyChart Message (if you have MyChart) OR . A paper copy in the mail If you have any lab test that is abnormal or we need to change your treatment, we will call you to review the results.  Testing/Procedures: NONE  Follow-Up: . Your scheduled follow-up appointment is April 13, 2018 with Almyra Deforest, PA-C .

## 2018-03-24 NOTE — Progress Notes (Signed)
Cardiology Office Note    Date:  03/26/2018   ID:  Ashanta, Amoroso 17-May-1965, MRN [id number]  PCP:  Haywood Pao, MD  Cardiologist: Dr. Sallyanne Kuster  Chief Complaint  Patient presents with  . Follow-up    seen for Dr. Sallyanne Kuster    History of Present Illness:  Nancy Sparks is a 53 y.o. female with PMH of CAD, HLD, and HTN.  She received DES to RCA in June 2019, 75% stenosis in a relatively small first diagonal artery that was medically managed.  She has severely elevated LDL level at the time, she was started on statin therapy and also later Repatha as well.  She underwent a repeat cardiac catheterization on 09/04/2017 which showed patent mid RCA stent, 40% ostial D1 lesion, normal LVEDP.  Her last office visit with Dr. Sallyanne Kuster was on 10/30/2017 at which time she was doing well.  Patient presents today for cardiology office visit.  This past Sunday, she had episode of facial and eyelid swelling.  Her blood pressure has been elevated.  She also noticed some upper chest tightness involving the throat area as well.  The location of the tightness is similar to what she has experienced in the past however it is not associated with exertion and the degree is much milder.  I have reviewed the previous cardiac catheterization report.  Her symptom does not have clear evidence of ACS.  EKG is normal except for non-Q wave in the inferior lead.  She did have more PVCs on Sunday, this has improved since yesterday.  Her facial swelling also improved yesterday as well.  The facial swelling sounds more like a allergic reaction.  She is euvolemic on physical exam without lower extremity edema and her lungs is clear on exam as well.  My suspicion for ACS is low at this time.  I recommended increasing her Imdur to 60 mg daily and reassessing in 1 to 2 weeks.  If her symptoms still persist, I will proceed with stress testing at this time.  Past Medical History:  Diagnosis Date  . Anemia    "w/pregnancies    . Anxiety    hx  . CAD (coronary artery disease)    LHC 6/19: pLAD, oD1 75, pRCA 20, mRCA 90 >> PCI: DES to RCA // Echo 6/19: EF 55-60, normal wall motion  . Complication of anesthesia    itching relieved with benadryl w/all my surgeries (07/24/2017)  . Depression    hx  . Dyslipidemia   . Fibroid   . GERD (gastroesophageal reflux disease)   . Heart murmur    hx  . Heart palpitations    hx of  . Hx of adenomatous polyp of colon 12/24/2002  . Hypertension     Past Surgical History:  Procedure Laterality Date  . BACK SURGERY    . [place]; 1998; 2001  . COLONOSCOPY    . CORONARY STENT INTERVENTION N/A 07/28/2017   Procedure: CORONARY STENT INTERVENTION;  Surgeon: Troy Sine, MD;  Location: [facility];  Service: Cardiovascular;  Laterality: N/A;  RCA  . LEFT HEART CATH AND CORONARY ANGIOGRAPHY N/A 07/28/2017   Procedure: LEFT HEART CATH AND CORONARY ANGIOGRAPHY;  Surgeon: Troy Sine, MD;  Location: [facility];  Service: Cardiovascular;  Laterality: N/A;  . LEFT HEART CATH AND CORONARY ANGIOGRAPHY N/A 09/04/2017   Procedure: LEFT HEART CATH AND CORONARY ANGIOGRAPHY;  Surgeon: Sherren Mocha, MD;  Location: [facility];  Service: Cardiovascular;  Laterality: N/A;  . LUMBAR LAMINECTOMY/DECOMPRESSION MICRODISCECTOMY  04/25/2011   Procedure: LUMBAR LAMINECTOMY/DECOMPRESSION MICRODISCECTOMY 1 LEVEL;  Surgeon: Elaina Hoops, MD;  Location: [facility];  Service: Neurosurgery;  Laterality: Left;  Left Lumbar Three-Four Laminectomy and Diskectomy  . LUMBAR LAMINECTOMY/DECOMPRESSION MICRODISCECTOMY  04/26/2011   Procedure: LUMBAR LAMINECTOMY/DECOMPRESSION MICRODISCECTOMY 1 LEVEL;  Surgeon: Elaina Hoops, MD;  Location: [facility];  Service: Neurosurgery;  Laterality: N/A;  Re-Exploration of Lumbar Wound and Evacuation of Epidural Hematoma  . [place]  2006  . TUBAL LIGATION  2001    Current Medications: Outpatient Medications Prior to Visit   Medication Sig Dispense Refill  . ALPRAZolam (XANAX) 0.25 MG tablet Take 1 tablet (0.25 mg total) by mouth daily as needed for anxiety. 5 tablet 0  . amLODipine (NORVASC) 2.5 MG tablet Take 1 tablet (2.5 mg total) by mouth daily. 90 tablet 3  . aspirin EC 81 MG EC tablet Take 1 tablet (81 mg total) by mouth daily. 90 tablet 3  . clopidogrel (PLAVIX) 75 MG tablet Take 1 tablet (75 mg total) by mouth daily. 90 tablet 3  . Evolocumab (REPATHA SURECLICK) 119 MG/ML SOAJ Inject 1 pen into the skin every 14 (fourteen) days. 2 pen 11  . metoprolol tartrate (LOPRESSOR) 50 MG tablet Take 1 tablet (50 mg total) by mouth 2 (two) times daily. 180 tablet 3  . nitroGLYCERIN (NITROSTAT) 0.4 MG SL tablet Place 1 tablet (0.4 mg total) under the tongue every 5 (five) minutes as needed for chest pain. 90 tablet 3  . rosuvastatin (CRESTOR) 40 MG tablet Take 1 tablet (40 mg total) by mouth at bedtime. 90 tablet 3  . STOOL SOFTENER 100 MG capsule Take 1 capsule by mouth as needed.   0  . Tetrahydrozoline HCl (VISINE OP) Place 1 drop into both eyes daily as needed (dry eyes).    . trazodone (DESYREL) 300 MG tablet TAKE 1/2 TO 1 TABLET BY MOUTH EVERY NIGHT AT BEDTIME 45 tablet 0  . erythromycin ophthalmic ointment Place a 1/2 inch ribbon of ointment into the lower eyelid of the right eye 4 times per day for 5-7 days. (Patient not taking: Reported on 03/24/2018) 1 g 0  . isosorbide mononitrate (IMDUR) 30 MG 24 hr tablet Take 1 tablet (30 mg total) by mouth daily. 90 tablet 3  . oxyCODONE-acetaminophen (PERCOCET/ROXICET) 5-325 MG tablet Take 1 tablet by mouth daily as needed.  0   No facility-administered medications prior to visit.      Allergies:   Latex and Tape   Social History   Socioeconomic History  . Marital status: Married    Spouse name: Not on file  . Number of children: 3  . Years of education: Not on file  . Highest education level: Not on file  Occupational History  . Occupation: stay at home mom   Social Needs  . Financial resource strain: Not on file  . Food insecurity:    Worry: Not on file    Inability: Not on file  . Transportation needs:    Medical: Not on file    Non-medical: Not on file  Tobacco Use  . Smoking status: Former Smoker    Packs/day: 0.10    Years: 2.00    Pack years: 0.20    Types: Cigarettes  . Smokeless tobacco: Never Used  . Tobacco comment: quit smoking in college  Substance and Sexual Activity  . Alcohol use: Yes    Alcohol/week:  6.0 standard drinks    Types: 6 Glasses of wine per week  . Drug use: Not Currently    Types: Marijuana    Comment: 07/24/2017 "in college"  . Sexual activity: Yes    Birth control/protection: Surgical  Lifestyle  . Physical activity:    Days per week: Not on file    Minutes per session: Not on file  . Stress: Not on file  Relationships  . Social connections:    Talks on phone: Not on file    Gets together: Not on file    Attends religious service: Not on file    Active member of club or organization: Not on file    Attends meetings of clubs or organizations: Not on file    Relationship status: Not on file  Other Topics Concern  . Not on file  Social History Narrative   Married to [place]   2 daughters and 1 son   Former Education officer, museum - homemaker now   6 drinks/week, no tobacco, no drug use        Family History:  The patient's family history includes Anesthesia problems in her mother and sister; Atrial fibrillation in her father; Cancer in her sister; Colon cancer (age of onset: 14) in her sister; Coronary artery disease in her father; Diabetes in her father; Heart disease in her father; Hyperlipidemia in her brother, father, mother, and sister; Hypertension in her father and mother; Thyroid disease in her sister.   ROS:   Please see the history of present illness.    ROS All other systems reviewed and are negative.   PHYSICAL EXAM:   VS:  Ht 5\' 6"  (1.676 m)   Wt 179 lb 3.2 oz (81.3 kg)   BMI 28.92  kg/m    GEN: Well nourished, well developed, in no acute distress  HEENT: normal  Neck: no JVD, carotid bruits, or masses Cardiac: RRR; no murmurs, rubs, or gallops,no edema  Respiratory:  clear to auscultation bilaterally, normal work of breathing GI: soft, nontender, nondistended, + BS MS: no deformity or atrophy  Skin: warm and dry, no rash Neuro:  Alert and Oriented x 3, Strength and sensation are intact Psych: euthymic mood, full affect  Wt Readings from Last 3 Encounters:  03/24/18 179 lb 3.2 oz (81.3 kg)  03/23/18 180 lb (81.6 kg)  01/21/18 178 lb (80.7 kg)      Studies/Labs Reviewed:   EKG:  EKG is ordered today.  The ekg ordered today demonstrates normal sinus rhythm with early repol  Recent Labs: 07/24/2017: ALT 23 09/03/2017: BUN 20; Creatinine, Ser 0.79; Hemoglobin 13.1; Platelets 178; Potassium 4.0; Sodium 141   Lipid Panel    Component Value Date/Time   CHOL 263 (H) 07/25/2017 1051   TRIG 58 07/25/2017 1051   HDL 78 07/25/2017 1051   CHOLHDL 3.4 07/25/2017 1051   VLDL 12 07/25/2017 1051   LDLCALC 173 (H) 07/25/2017 1051   LDLDIRECT 115 (H) 09/03/2017 1209   LDLDIRECT 155.5 05/14/2011 0924    Additional studies/ records that were reviewed today include:   Cath 07/28/2017  Prox LAD lesion is 20% stenosed.  Ost 1st Diag lesion is 75% stenosed.  Prox RCA lesion is 20% stenosed.  Mid RCA lesion is 90% stenosed.  Post intervention, there is a 0% residual stenosis.  A stent was successfully placed.   Two-vessel coronary artery disease with 75% ostial diagonal 1 stenosis with smooth 20% narrowing in the LAD immediately after the diagonal takeoff; normal left  circumflex coronary artery; mild eccentric calcification in the very proximal RCA with 20% narrowing and 90% proximal to mid focal stenosis.  LVEDP 7 mmHg.  Successful percutaneous coronary intervention to the RCA with ultimate insertion of a 3.0 x 18 mm Resolute Onyx DES stent postdilated to 3.25  mm with a 90% stenosis being reduced to 0%.  RECOMMENDATION: Recommend DAPT therapy for minimum of 1 year.  Initial medical management for her diagonal stenosis and will add low-dose nitrates to her beta-blocker regimen.  The patient requires aggressive lipid intervention and with her high likelihood of familial hyperlipidemia based on her significant LDL elevations in the past, would recommend PCSK9 inhibition with concomitant statin therapy for optimal treatment.   Cath 09/04/2017  Previously placed Prox RCA to Mid RCA stent (unknown type) is widely patent.  Ost 1st Diag lesion is 40% stenosed.   1. Widely patent RCA stent with no evidence of restenosis 2. Widely patent coronaries with mild ostial diagonal stenosis (small vessel) 3. Normal LVEDP  The overall appearance of the coronary arteries are improved compared to the previous study, suggesting a component of vasospasm. Would continue current medical therapy and follow-up as planned.     ASSESSMENT:    1. PVC's (premature ventricular contractions)   2. Coronary artery disease involving native coronary artery of native heart without angina pectoris   3. Essential hypertension   4. Hyperlipidemia LDL goal <70   5. Swelling of face      PLAN:  In order of problems listed above:  1. PVCs: Symptom improved in the last 3 days, she had more PVCs this past Sunday in the setting of facial swelling.  I suspected it was an allergic reaction.  2. Facial swelling: This sounds more like allergic reaction, symptom has been resolving for the past 3 days.  3. CAD: Last PCI was in June 2019, relook cardiac catheterization in August 2019 showed patent stent.  Although patient had upper chest pain and throat tightness this past Sunday, this was not related to exertion and the symptom is very atypical in the setting of facial swelling.  I did increase her Imdur to 60 mg daily.  I plan to reassess the patient in 1 to 2 weeks  4. Hypertension:  Blood pressure stable  5. Hyperlipidemia: Continue Repatha and Crestor    Medication Adjustments/Labs and Tests Ordered: Current medicines are reviewed at length with the patient today.  Concerns regarding medicines are outlined above.  Medication changes, Labs and Tests ordered today are listed in the Patient Instructions below. Patient Instructions  Medication Instructions:   INCREASE IMDUR TO 60 MG DAILY   If you need a refill on your cardiac medications before your next appointment, please call your pharmacy.   Lab work: NONE  If you have labs (blood work) drawn today and your tests are completely normal, you will receive your results only by: Marland Kitchen MyChart Message (if you have MyChart) OR . A paper copy in the mail If you have any lab test that is abnormal or we need to change your treatment, we will call you to review the results.  Testing/Procedures: NONE  Follow-Up: . Your scheduled follow-up appointment is April 13, 2018 with Almyra Deforest, PA-C .          Hilbert Corrigan, [place]  03/26/2018 11:25 PM    [facility] [address], [place], [place]  [address] Phone: [phone]; Fax: [phone]

## 2018-03-26 ENCOUNTER — Encounter: Payer: Self-pay | Admitting: Physician Assistant

## 2018-03-30 NOTE — Progress Notes (Signed)
Thanks.  I do not think this was acute ischemia or that any of her cardiac medicines had anything to do with it MCr

## 2018-04-13 ENCOUNTER — Encounter: Payer: Self-pay | Admitting: Physician Assistant

## 2018-04-13 ENCOUNTER — Ambulatory Visit: Payer: BLUE CROSS/BLUE SHIELD | Admitting: Physician Assistant

## 2018-04-13 ENCOUNTER — Telehealth: Payer: Self-pay | Admitting: *Deleted

## 2018-04-13 VITALS — BP 104/70 | HR 64 | Ht 66.0 in | Wt 180.0 lb

## 2018-04-13 DIAGNOSIS — E785 Hyperlipidemia, unspecified: Secondary | ICD-10-CM

## 2018-04-13 DIAGNOSIS — I10 Essential (primary) hypertension: Secondary | ICD-10-CM | POA: Diagnosis not present

## 2018-04-13 DIAGNOSIS — I493 Ventricular premature depolarization: Secondary | ICD-10-CM

## 2018-04-13 DIAGNOSIS — I2510 Atherosclerotic heart disease of native coronary artery without angina pectoris: Secondary | ICD-10-CM | POA: Diagnosis not present

## 2018-04-13 NOTE — Progress Notes (Signed)
Cardiology Office Note    Date:  04/14/2018   ID:  AHNIKA HANNIBAL, DOB 06-Oct-1965, MRN [id number]  PCP:  Haywood Pao, MD  Cardiologist:  Dr. Sallyanne Kuster  Chief Complaint  Patient presents with  . Follow-up    seen for Dr. Sallyanne Kuster    History of Present Illness:  Nancy Sparks is a 53 y.o. female with PMH of CAD, HLD, and HTN.  She received DES to RCA in June 2019, 75% stenosis in a relatively small first diagonal artery that was medically managed.  She has severely elevated LDL level at the time, she was started on statin therapy and also later Repatha as well.  She underwent a repeat cardiac catheterization on 09/04/2017 which showed patent mid RCA stent, 40% ostial D1 lesion, normal LVEDP.  Her last office visit with Dr. Sallyanne Kuster was on 10/30/2017 at which time she was doing well.  I last saw the patient on 03/24/2022 increased PVCs and facial swelling.  I suspect that she might had allergic reaction.  She had some chest discomfort and throat tightness, I suspected this was also related to allergic reaction.  I did increase her Imdur to 60 mg daily.  She presents back today for reassessment.  Her blood pressure is stable.  She still has occasional PVCs, which occurs about 2-3 times per hour.  She has chronic dizziness but this is not related to the PVC.  Given the asymptomatic nature of the PVC, I would not recommend further work-up.  In the future we can consider increase on the metoprolol, however this is not necessary at this time.  She denies any further throat tightness or upper chest discomfort, no further work-up is needed.  She does occasionally still have some puffiness in the face in the in the eyelid, she said this is likely related to her seasonal allergies.  Past Medical History:  Diagnosis Date  . Anemia    "w/pregnancies  . Anxiety    hx  . CAD (coronary artery disease)    LHC 6/19: pLAD, oD1 75, pRCA 20, mRCA 90 >> PCI: DES to RCA // Echo 6/19: EF 55-60, normal wall  motion  . Complication of anesthesia    itching relieved with benadryl w/all my surgeries (07/24/2017)  . Depression    hx  . Dyslipidemia   . Fibroid   . GERD (gastroesophageal reflux disease)   . Heart murmur    hx  . Heart palpitations    hx of  . Hx of adenomatous polyp of colon 12/24/2002  . Hypertension     Past Surgical History:  Procedure Laterality Date  . BACK SURGERY    . [place]; 1998; 2001  . COLONOSCOPY    . CORONARY STENT INTERVENTION N/A 07/28/2017   Procedure: CORONARY STENT INTERVENTION;  Surgeon: Troy Sine, MD;  Location: [facility];  Service: Cardiovascular;  Laterality: N/A;  RCA  . LEFT HEART CATH AND CORONARY ANGIOGRAPHY N/A 07/28/2017   Procedure: LEFT HEART CATH AND CORONARY ANGIOGRAPHY;  Surgeon: Troy Sine, MD;  Location: [facility];  Service: Cardiovascular;  Laterality: N/A;  . LEFT HEART CATH AND CORONARY ANGIOGRAPHY N/A 09/04/2017   Procedure: LEFT HEART CATH AND CORONARY ANGIOGRAPHY;  Surgeon: Sherren Mocha, MD;  Location: [facility];  Service: Cardiovascular;  Laterality: N/A;  . LUMBAR LAMINECTOMY/DECOMPRESSION MICRODISCECTOMY  04/25/2011   Procedure: LUMBAR LAMINECTOMY/DECOMPRESSION MICRODISCECTOMY 1 LEVEL;  Surgeon: Elaina Hoops, MD;  Location: [facility]UR[facility] ORS;  Service: Neurosurgery;  Laterality: Left;  Left Lumbar Three-Four Laminectomy and Diskectomy  . LUMBAR LAMINECTOMY/DECOMPRESSION MICRODISCECTOMY  04/26/2011   Procedure: LUMBAR LAMINECTOMY/DECOMPRESSION MICRODISCECTOMY 1 LEVEL;  Surgeon: Elaina Hoops, MD;  Location: [facility];  Service: Neurosurgery;  Laterality: N/A;  Re-Exploration of Lumbar Wound and Evacuation of Epidural Hematoma  . [place]  2006  . TUBAL LIGATION  2001    Current Medications: Outpatient Medications Prior to Visit  Medication Sig Dispense Refill  . ALPRAZolam (XANAX) 0.25 MG tablet Take 1 tablet (0.25 mg total) by mouth daily as needed for anxiety. 5 tablet 0    . amLODipine (NORVASC) 2.5 MG tablet Take 1 tablet (2.5 mg total) by mouth daily. 90 tablet 3  . aspirin EC 81 MG EC tablet Take 1 tablet (81 mg total) by mouth daily. 90 tablet 3  . clopidogrel (PLAVIX) 75 MG tablet Take 1 tablet (75 mg total) by mouth daily. 90 tablet 3  . Evolocumab (REPATHA SURECLICK) 782 MG/ML SOAJ Inject 1 pen into the skin every 14 (fourteen) days. 2 pen 11  . isosorbide mononitrate (IMDUR) 60 MG 24 hr tablet Take 1 tablet (60 mg total) by mouth daily. 30 tablet 1  . rosuvastatin (CRESTOR) 40 MG tablet Take 1 tablet (40 mg total) by mouth at bedtime. 90 tablet 3  . STOOL SOFTENER 100 MG capsule Take 1 capsule by mouth as needed.   0  . Tetrahydrozoline HCl (VISINE OP) Place 1 drop into both eyes daily as needed (dry eyes).    . trazodone (DESYREL) 300 MG tablet TAKE 1/2 TO 1 TABLET BY MOUTH EVERY NIGHT AT BEDTIME 45 tablet 0  . metoprolol tartrate (LOPRESSOR) 50 MG tablet Take 1 tablet (50 mg total) by mouth 2 (two) times daily. 180 tablet 3  . nitroGLYCERIN (NITROSTAT) 0.4 MG SL tablet Place 1 tablet (0.4 mg total) under the tongue every 5 (five) minutes as needed for chest pain. 90 tablet 3   No facility-administered medications prior to visit.      Allergies:   Latex and Tape   Social History   Socioeconomic History  . Marital status: Married    Spouse name: Not on file  . Number of children: 3  . Years of education: Not on file  . Highest education level: Not on file  Occupational History  . Occupation: stay at home mom  Social Needs  . Financial resource strain: Not on file  . Food insecurity:    Worry: Not on file    Inability: Not on file  . Transportation needs:    Medical: Not on file    Non-medical: Not on file  Tobacco Use  . Smoking status: Former Smoker    Packs/day: 0.10    Years: 2.00    Pack years: 0.20    Types: Cigarettes  . Smokeless tobacco: Never Used  . Tobacco comment: quit smoking in college  Substance and Sexual Activity   . Alcohol use: Yes    Alcohol/week: 6.0 standard drinks    Types: 6 Glasses of wine per week  . Drug use: Not Currently    Types: Marijuana    Comment: 07/24/2017 "in college"  . Sexual activity: Yes    Birth control/protection: Surgical  Lifestyle  . Physical activity:    Days per week: Not on file    Minutes per session: Not on file  . Stress: Not on file  Relationships  . Social connections:    Talks on  phone: Not on file    Gets together: Not on file    Attends religious service: Not on file    Active member of club or organization: Not on file    Attends meetings of clubs or organizations: Not on file    Relationship status: Not on file  Other Topics Concern  . Not on file  Social History Narrative   Married to [place]   2 daughters and 1 son   Former Education officer, museum - homemaker now   6 drinks/week, no tobacco, no drug use        Family History:  The patient's family history includes Anesthesia problems in her mother and sister; Atrial fibrillation in her father; Cancer in her sister; Colon cancer (age of onset: 65) in her sister; Coronary artery disease in her father; Diabetes in her father; Heart disease in her father; Hyperlipidemia in her brother, father, mother, and sister; Hypertension in her father and mother; Thyroid disease in her sister.   ROS:   Please see the history of present illness.    ROS All other systems reviewed and are negative.   PHYSICAL EXAM:   VS:  BP 104/70   Pulse 64   Ht 5\' 6"  (1.676 m)   Wt 180 lb (81.6 kg)   SpO2 96%   BMI 29.05 kg/m    GEN: Well nourished, well developed, in no acute distress  HEENT: normal  Neck: no JVD, carotid bruits, or masses Cardiac: RRR; no murmurs, rubs, or gallops,no edema  Respiratory:  clear to auscultation bilaterally, normal work of breathing GI: soft, nontender, nondistended, + BS MS: no deformity or atrophy  Skin: warm and dry, no rash Neuro:  Alert and Oriented x 3, Strength and sensation are  intact Psych: euthymic mood, full affect  Wt Readings from Last 3 Encounters:  04/13/18 180 lb (81.6 kg)  03/24/18 179 lb 3.2 oz (81.3 kg)  03/23/18 180 lb (81.6 kg)      Studies/Labs Reviewed:   EKG:  EKG is not ordered today.   Recent Labs: 07/24/2017: ALT 23 09/03/2017: BUN 20; Creatinine, Ser 0.79; Hemoglobin 13.1; Platelets 178; Potassium 4.0; Sodium 141   Lipid Panel    Component Value Date/Time   CHOL 263 (H) 07/25/2017 1051   TRIG 58 07/25/2017 1051   HDL 78 07/25/2017 1051   CHOLHDL 3.4 07/25/2017 1051   VLDL 12 07/25/2017 1051   LDLCALC 173 (H) 07/25/2017 1051   LDLDIRECT 115 (H) 09/03/2017 1209   LDLDIRECT 155.5 05/14/2011 0924    Additional studies/ records that were reviewed today include:   Cath 07/28/2017  Prox LAD lesion is 20% stenosed.  Ost 1st Diag lesion is 75% stenosed.  Prox RCA lesion is 20% stenosed.  Mid RCA lesion is 90% stenosed.  Post intervention, there is a 0% residual stenosis.  A stent was successfully placed.  Two-vessel coronary artery disease with 75% ostial diagonal 1 stenosis with smooth 20% narrowing in the LAD immediately after the diagonal takeoff; normal left circumflex coronary artery; mild eccentric calcification in the very proximal RCA with 20% narrowing and 90% proximal to mid focal stenosis.  LVEDP 7 mmHg.  Successful percutaneous coronary intervention to the RCA with ultimate insertion of a 3.0 x 18 mm Resolute Onyx DES stent postdilated to 3.25 mm with a 90% stenosis being reduced to 0%.  RECOMMENDATION: Recommend DAPT therapy for minimum of 1 year. Initial medical management for her diagonal stenosis and will add low-dose nitrates to her beta-blocker regimen.  The patient requires aggressive lipid intervention and with her high likelihood of familial hyperlipidemia based on her significant LDL elevations in the past, would recommend PCSK9 inhibition with concomitant statin therapy for optimal  treatment.   Cath 09/04/2017  Previously placed Prox RCA to Mid RCA stent (unknown type) is widely patent.  Ost 1st Diag lesion is 40% stenosed.  1. Widely patent RCA stent with no evidence of restenosis 2. Widely patent coronaries with mild ostial diagonal stenosis (small vessel) 3. Normal LVEDP  The overall appearance of the coronary arteries are improved compared to the previous study, suggesting a component of vasospasm. Would continue current medical therapy and follow-up as planned.    ASSESSMENT:    1. Coronary artery disease involving native coronary artery of native heart without angina pectoris   2. Essential hypertension   3. Hyperlipidemia LDL goal <70   4. PVC (premature ventricular contraction)      PLAN:  In order of problems listed above:  1. CAD: Although she had a episode of chest tightness and throat tightness prior to the last office visit, this has resolved.  Symptom is unlikely angina and occurred in the setting of facial and lip swelling.  It sounds more like a allergic reaction.  I did increase the Imdur to 60 mg daily.  Given lack of recurrence symptom, no further work-up is needed.  2. PVCs: She continued to have occasional episodes of PVCs, I recommended continue on the current dose of metoprolol.  3. Hyperlipidemia: On Crestor 40 mg daily  4. Hypertension: Blood pressure stable on current therapy.    Medication Adjustments/Labs and Tests Ordered: Current medicines are reviewed at length with the patient today.  Concerns regarding medicines are outlined above.  Medication changes, Labs and Tests ordered today are listed in the Patient Instructions below. Patient Instructions  Medication Instructions:  Your physician recommends that you continue on your current medications as directed. Please refer to the Current Medication list given to you today.  If you need a refill on your cardiac medications before your next appointment, please call your  pharmacy.   Lab work: None  If you have labs (blood work) drawn today and your tests are completely normal, you will receive your results only by: Marland Kitchen MyChart Message (if you have MyChart) OR . A paper copy in the mail If you have any lab test that is abnormal or we need to change your treatment, we will call you to review the results.  Testing/Procedures: None   Follow-Up: . Your physician recommends that you schedule a follow-up appointment in June 2020   Any Other Special Instructions Will Be Listed Below (If Applicable).       Hilbert Corrigan, [place]  04/14/2018 8:58 AM    [facility] [place], [place], [place]  [address] Phone: [phone]; Fax: [phone]

## 2018-04-13 NOTE — Patient Instructions (Signed)
Medication Instructions:  Your physician recommends that you continue on your current medications as directed. Please refer to the Current Medication list given to you today.  If you need a refill on your cardiac medications before your next appointment, please call your pharmacy.   Lab work: None  If you have labs (blood work) drawn today and your tests are completely normal, you will receive your results only by: Marland Kitchen MyChart Message (if you have MyChart) OR . A paper copy in the mail If you have any lab test that is abnormal or we need to change your treatment, we will call you to review the results.  Testing/Procedures: None   Follow-Up: . Your physician recommends that you schedule a follow-up appointment in June 2020   Any Other Special Instructions Will Be Listed Below (If Applicable).

## 2018-04-13 NOTE — Telephone Encounter (Signed)
Please call, I do not see where we have prescribed this, trazodone 300 mg is a high dose, ask her how often she takes this?Marland Kitchen

## 2018-04-13 NOTE — Telephone Encounter (Addendum)
Under medication list you refilled last on 02/02/18 # 45 tablet, patient said she take 1/2 tablet nightly to help with sleep, Dr. Toney Rakes originally prescribed medication and this dose to help patient with insurance, reports taking for at least 8 years.  Please advise

## 2018-04-13 NOTE — Telephone Encounter (Signed)
Patient called requesting refills  on trazodone 300 mg tablet. Please advise

## 2018-04-13 NOTE — Telephone Encounter (Signed)
Okay for refill, reviewed best not to take daily, review sleep hygiene-go to bed and get up at about the same time, avoid naps, avoid caffeine after noon,

## 2018-04-14 ENCOUNTER — Encounter: Payer: Self-pay | Admitting: Physician Assistant

## 2018-04-14 MED ORDER — TRAZODONE HCL 300 MG PO TABS: ORAL_TABLET | ORAL | 0 refills | Status: DC

## 2018-04-14 NOTE — Telephone Encounter (Signed)
Rx sent, pt aware 

## 2018-06-30 ENCOUNTER — Telehealth: Payer: Self-pay | Admitting: Cardiovascular Disease

## 2018-06-30 MED ORDER — ROSUVASTATIN CALCIUM 40 MG PO TABS: 40.0000 mg | ORAL_TABLET | Freq: Every day | ORAL | 3 refills | Status: DC

## 2018-06-30 MED ORDER — AMLODIPINE BESYLATE 2.5 MG PO TABS: 2.5000 mg | ORAL_TABLET | Freq: Every day | ORAL | 3 refills | Status: DC

## 2018-06-30 MED ORDER — ISOSORBIDE MONONITRATE ER 60 MG PO TB24: 60.0000 mg | ORAL_TABLET | Freq: Every day | ORAL | 3 refills | Status: DC

## 2018-06-30 MED ORDER — CLOPIDOGREL BISULFATE 75 MG PO TABS: 75.0000 mg | ORAL_TABLET | Freq: Every day | ORAL | 3 refills | Status: DC

## 2018-06-30 MED ORDER — METOPROLOL TARTRATE 50 MG PO TABS: 50.0000 mg | ORAL_TABLET | Freq: Two times a day (BID) | ORAL | 3 refills | Status: DC

## 2018-06-30 NOTE — Telephone Encounter (Signed)
New Message    *STAT* If patient is at the pharmacy, call can be transferred to refill team.   1. Which medications need to be refilled? (please list name of each medication and dose if known) Patient states all cardiac meds she didn't have meds on her and didn't know the name of them.  2. Which pharmacy/location (including street and city if local pharmacy) is medication to be sent to?[facility] on [address]   3. Do they need a 30 day or 90 day supply? 90 day supply

## 2018-06-30 NOTE — Telephone Encounter (Signed)
Returned call to patient she stated she needed 90 day refills on all cardiac medications.Refills sent to pharmacy.

## 2018-07-09 ENCOUNTER — Other Ambulatory Visit: Payer: Self-pay | Admitting: Cardiovascular Disease

## 2018-07-16 ENCOUNTER — Other Ambulatory Visit: Payer: Self-pay | Admitting: Cardiology

## 2018-07-16 ENCOUNTER — Other Ambulatory Visit: Payer: Self-pay | Admitting: Physician Assistant

## 2018-07-16 NOTE — Telephone Encounter (Signed)
This is Dr. Croitoru's pt 

## 2018-07-17 ENCOUNTER — Encounter

## 2018-07-19 ENCOUNTER — Other Ambulatory Visit: Payer: Self-pay | Admitting: Cardiology

## 2018-07-21 NOTE — Telephone Encounter (Signed)
Outpatient Medication Detail   Disp Refills Start End   isosorbide mononitrate (IMDUR) 60 MG 24 hr tablet 90 tablet 3 06/30/2018    Sig - Route: Take 1 tablet (60 mg total) by mouth daily. - Oral   Sent to pharmacy as: isosorbide mononitrate (IMDUR) 60 MG 24 hr tablet   E-Prescribing Status: Receipt confirmed by pharmacy (06/30/2018 4:23 PM EDT)   Pharmacy  [id number] - [place], [place]

## 2018-08-06 ENCOUNTER — Other Ambulatory Visit: Payer: Self-pay | Admitting: Cardiology

## 2018-08-13 ENCOUNTER — Telehealth: Payer: Self-pay | Admitting: *Deleted

## 2018-08-13 MED ORDER — TRAZODONE HCL 300 MG PO TABS: ORAL_TABLET | ORAL | 0 refills | Status: DC

## 2018-08-13 NOTE — Telephone Encounter (Signed)
Patient called requesting refill on Trazodone 300 mg tablet #45, last filled on 04/14/18. Patient said Rx helps with sleep, has tried no to take often, but when she doesn't take she is up at least 3-4 hours, patient would like to continue on Rx. Please advise

## 2018-08-13 NOTE — Telephone Encounter (Signed)
TC for clarificATION, states takes 1/2 tablet  Originally ordered by psychiatrist and was on other drugs as well currently only on trazadone for sleep and has been for 7-8 yrs  Will ck with cardiologist if any problem with continuing.   Please call in for pt

## 2018-08-13 NOTE — Telephone Encounter (Signed)
Rx sent 

## 2018-08-18 ENCOUNTER — Encounter: Payer: Self-pay | Admitting: Cardiovascular Disease

## 2018-08-18 DIAGNOSIS — E7800 Pure hypercholesterolemia, unspecified: Secondary | ICD-10-CM | POA: Diagnosis not present

## 2018-08-18 DIAGNOSIS — Z0000 Encounter for general adult medical examination without abnormal findings: Secondary | ICD-10-CM | POA: Diagnosis not present

## 2018-08-18 DIAGNOSIS — R7301 Impaired fasting glucose: Secondary | ICD-10-CM | POA: Diagnosis not present

## 2018-08-19 DIAGNOSIS — R82998 Other abnormal findings in urine: Secondary | ICD-10-CM | POA: Diagnosis not present

## 2018-08-25 ENCOUNTER — Telehealth: Payer: Self-pay | Admitting: *Deleted

## 2018-08-25 DIAGNOSIS — F419 Anxiety disorder, unspecified: Secondary | ICD-10-CM | POA: Diagnosis not present

## 2018-08-25 DIAGNOSIS — Z0000 Encounter for general adult medical examination without abnormal findings: Secondary | ICD-10-CM | POA: Diagnosis not present

## 2018-08-25 DIAGNOSIS — I2510 Atherosclerotic heart disease of native coronary artery without angina pectoris: Secondary | ICD-10-CM | POA: Diagnosis not present

## 2018-08-25 DIAGNOSIS — E7801 Familial hypercholesterolemia: Secondary | ICD-10-CM | POA: Diagnosis not present

## 2018-08-25 DIAGNOSIS — I209 Angina pectoris, unspecified: Secondary | ICD-10-CM | POA: Diagnosis not present

## 2018-08-25 NOTE — Telephone Encounter (Signed)
Patient made aware to have her blood pressre, heart rate and weight in the morning.     Virtual Visit Pre-Appointment Phone Call  "(Name), I am calling you today to discuss your upcoming appointment. We are currently trying to limit exposure to the virus that causes COVID-19 by seeing patients at home rather than in the office."  1. "What is the BEST phone number to call the day of the visit?" - include this in appointment notes  2. "Do you have or have access to (through a family member/friend) a smartphone with video capability that we can use for your visit?" a. If yes - list this number in appt notes as "cell" (if different from BEST phone #) and list the appointment type as a VIDEO visit in appointment notes b. If no - list the appointment type as a PHONE visit in appointment notes  3. Confirm consent - "In the setting of the current Covid19 crisis, you are scheduled for a (phone or video) visit with your provider on (date) at (time).  Just as we do with many in-office visits, in order for you to participate in this visit, we must obtain consent.  If you'd like, I can send this to your mychart (if signed up) or email for you to review.  Otherwise, I can obtain your verbal consent now.  All virtual visits are billed to your insurance company just like a normal visit would be.  By agreeing to a virtual visit, we'd like you to understand that the technology does not allow for your provider to perform an examination, and thus may limit your provider's ability to fully assess your condition. If your provider identifies any concerns that need to be evaluated in person, we will make arrangements to do so.  Finally, though the technology is pretty good, we cannot assure that it will always work on either your or our end, and in the setting of a video visit, we may have to convert it to a phone-only visit.  In either situation, we cannot ensure that we have a secure connection.  Are you willing to  proceed?" YES  4. Advise patient to be prepared - "Two hours prior to your appointment, go ahead and check your blood pressure, pulse, oxygen saturation, and your weight (if you have the equipment to check those) and write them all down. When your visit starts, your provider will ask you for this information. If you have an Apple Watch or Kardia device, please plan to have heart rate information ready on the day of your appointment. Please have a pen and paper handy nearby the day of the visit as well."  5. Give patient instructions for MyChart download to smartphone OR Doximity/Doxy.me as below if video visit (depending on what platform provider is using)  6. Inform patient they will receive a phone call 15 minutes prior to their appointment time (may be from unknown caller ID) so they should be prepared to answer    TELEPHONE CALL NOTE  Nancy Sparks has been deemed a candidate for a follow-up tele-health visit to limit community exposure during the Covid-19 pandemic. I spoke with the patient via phone to ensure availability of phone/video source, confirm preferred email & phone number, and discuss instructions and expectations.  I reminded Nancy Sparks to be prepared with any vital sign and/or heart rhythm information that could potentially be obtained via home monitoring, at the time of her visit. I reminded Nancy Sparks to expect a  phone call prior to her visit.  Ricci Barker, RN 08/25/2018 10:01 AM   INSTRUCTIONS FOR DOWNLOADING THE MYCHART APP TO SMARTPHONE  - The patient must first make sure to have activated MyChart and know their login information - If Apple, go to [employer] and type in MyChart in the search bar and download the app. If Android, ask patient to go to [employer] and type in [place] in the search bar and download the app. The app is free but as with any other app downloads, their phone may require them to verify saved payment information or Apple/Android  password.  - The patient will need to then log into the app with their MyChart username and password, and select [facility] as their healthcare provider to link the account. When it is time for your visit, go to the MyChart app, find appointments, and click Begin Video Visit. Be sure to Select Allow for your device to access the Microphone and Camera for your visit. You will then be connected, and your provider will be with you shortly.  **If they have any issues connecting, or need assistance please contact MyChart service desk [phone])**  **If using a computer, in order to ensure the best quality for their visit they will need to use either of the following Internet Browsers: [employer], or Google Chrome**  IF USING DOXIMITY or DOXY.ME - The patient will receive a link just prior to their visit by text.     FULL LENGTH CONSENT FOR TELE-HEALTH VISIT   I hereby voluntarily request, consent and authorize [facility] and its employed or contracted physicians, physician assistants, nurse practitioners or other licensed health care professionals (the Practitioner), to provide me with telemedicine health care services (the "Services") as deemed necessary by the treating Practitioner. I acknowledge and consent to receive the Services by the Practitioner via telemedicine. I understand that the telemedicine visit will involve communicating with the Practitioner through live audiovisual communication technology and the disclosure of certain medical information by electronic transmission. I acknowledge that I have been given the opportunity to request an in-person assessment or other available alternative prior to the telemedicine visit and am voluntarily participating in the telemedicine visit.  I understand that I have the right to withhold or withdraw my consent to the use of telemedicine in the course of my care at any time, without affecting my right to future care or treatment,  and that the Practitioner or I may terminate the telemedicine visit at any time. I understand that I have the right to inspect all information obtained and/or recorded in the course of the telemedicine visit and may receive copies of available information for a reasonable fee.  I understand that some of the potential risks of receiving the Services via telemedicine include:  Marland Kitchen Delay or interruption in medical evaluation due to technological equipment failure or disruption; . Information transmitted may not be sufficient (e.g. poor resolution of images) to allow for appropriate medical decision making by the Practitioner; and/or  . In rare instances, security protocols could fail, causing a breach of personal health information.  Furthermore, I acknowledge that it is my responsibility to provide information about my medical history, conditions and care that is complete and accurate to the best of my ability. I acknowledge that Practitioner's advice, recommendations, and/or decision may be based on factors not within their control, such as incomplete or inaccurate data provided by me or distortions of diagnostic images or specimens that may  result from electronic transmissions. I understand that the practice of medicine is not an exact science and that Practitioner makes no warranties or guarantees regarding treatment outcomes. I acknowledge that I will receive a copy of this consent concurrently upon execution via email to the email address I last provided but may also request a printed copy by calling the office of [facility].    I understand that my insurance will be billed for this visit.   I have read or had this consent read to me. . I understand the contents of this consent, which adequately explains the benefits and risks of the Services being provided via telemedicine.  . I have been provided ample opportunity to ask questions regarding this consent and the Services and have had my questions  answered to my satisfaction. . I give my informed consent for the services to be provided through the use of telemedicine in my medical care  By participating in this telemedicine visit I agree to the above.

## 2018-08-26 ENCOUNTER — Telehealth (INDEPENDENT_AMBULATORY_CARE_PROVIDER_SITE_OTHER): Payer: BC Managed Care – PPO | Admitting: Cardiovascular Disease

## 2018-08-26 ENCOUNTER — Encounter: Payer: Self-pay | Admitting: Cardiovascular Disease

## 2018-08-26 DIAGNOSIS — R002 Palpitations: Secondary | ICD-10-CM | POA: Diagnosis not present

## 2018-08-26 DIAGNOSIS — E7801 Familial hypercholesterolemia: Secondary | ICD-10-CM | POA: Diagnosis not present

## 2018-08-26 DIAGNOSIS — I25119 Atherosclerotic heart disease of native coronary artery with unspecified angina pectoris: Secondary | ICD-10-CM

## 2018-08-26 DIAGNOSIS — I10 Essential (primary) hypertension: Secondary | ICD-10-CM

## 2018-08-26 MED ORDER — ISOSORBIDE MONONITRATE ER 30 MG PO TB24: 30.0000 mg | ORAL_TABLET | Freq: Every day | ORAL | Status: DC

## 2018-08-26 NOTE — Patient Instructions (Addendum)
Medication Instructions:  STOP the Clopidogrel (Plavix) DECREASE the Isosorbide (Imdur) to 30 mg once daily  If you need a refill on your cardiac medications before your next appointment, please call your pharmacy.   Lab work: None ordered If you have labs (blood work) drawn today and your tests are completely normal, you will receive your results only by: Marland Kitchen MyChart Message (if you have MyChart) OR . A paper copy in the mail If you have any lab test that is abnormal or we need to change your treatment, we will call you to review the results.  Testing/Procedures: None ordered  Follow-Up: At [facility], you and your health needs are our priority.  As part of our continuing mission to provide you with exceptional heart care, we have created designated Provider Care Teams.  These Care Teams include your primary Cardiologist (physician) and Advanced Practice Providers (APPs -  Physician Assistants and Nurse Practitioners) who all work together to provide you with the care you need, when you need it. You will need a follow up appointment in 12 months.  Please call our office 2 months in advance to schedule this appointment.  You may see Sanda Klein, MD or one of the following Advanced Practice Providers on your designated Care Team:   Kerin Ransom, PA-C [place], [place] . Sande Rives, PA-C  Any Other Special Instructions Will Be Listed Below (If Applicable). Call the office in 1-2 months to discuss stopping the Isosorbide (Imdur) at [phone]

## 2018-08-26 NOTE — Progress Notes (Signed)
Virtual Visit via Video Note   This visit type was conducted due to national recommendations for restrictions regarding the COVID-19 Pandemic (e.g. social distancing) in an effort to limit this patient's exposure and mitigate transmission in our community.  Due to her co-morbid illnesses, this patient is at least at moderate risk for complications without adequate follow up.  This format is felt to be most appropriate for this patient at this time.  All issues noted in this document were discussed and addressed.  A limited physical exam was performed with this format.  Please refer to the patient's chart for her consent to telehealth for [facility].   Date:  08/26/2018   ID:  Nancy Sparks, DOB [birth year], MRN [id number]  Patient Location: Home Provider Location: Other:  [place]  PCP:  Haywood Pao, MD  Cardiologist:  Sanda Klein, MD  Electrophysiologist:  None   Evaluation Performed:  Follow-Up Visit  Chief Complaint:  CAD, HTN, [place]  History of Present Illness:    Nancy Sparks is a 53 y.o. female with systemic hypertension and severe hypercholesterolemia almost certainly related to heterozygous familial hypercholesterolemia who presented with unstable angina and received a drug-eluting stent to the right coronary artery in June 2019.  She also had a 75% stenosis in a small first diagonal artery left for medical therapy.  He had recurrent problems with angina after the procedure despite having no evidence of problems on repeat: Angiography, suggesting she may have a component of coronary vasospasm.  Symptoms have been reasonably well-controlled since then on long-acting nitrates, amlodipine and beta-blockers.  Her LDL cholesterol which in the past has been documented as high as 217 required treatment with high-dose rosuvastatin in combination with Repatha, most recent LDL cholesterol is down to 49.  She has not required sublingual nitroglycerin since last year.  She has not  had anginal chest discomfort in several months.  She denies problems with exertional dyspnea, focal neurological events, edema or claudication.  When working outside in her garden in the heat she experienced extreme fatigue and rapid palpitations.  Gradually improved when she laid down inside the air conditioned house for about 30 minutes, recurred when she try to go back outside.  She did not experience syncope.  The palpitations resolved gradually, not abruptly. Has easy bruising from clopidogrel.  The patient does not have symptoms concerning for COVID-19 infection (fever, chills, cough, or new shortness of breath).    Past Medical History:  Diagnosis Date  . Anemia    "w/pregnancies  . Anxiety    hx  . CAD (coronary artery disease)    LHC 6/19: pLAD, oD1 75, pRCA 20, mRCA 90 >> PCI: DES to RCA // Echo 6/19: EF 55-60, normal wall motion  . Complication of anesthesia    itching relieved with benadryl w/all my surgeries (07/24/2017)  . Depression    hx  . Dyslipidemia   . Fibroid   . GERD (gastroesophageal reflux disease)   . Heart murmur    hx  . Heart palpitations    hx of  . Hx of adenomatous polyp of colon 12/24/2002  . Hypertension    Past Surgical History:  Procedure Laterality Date  . BACK SURGERY    . [place]; 1998; 2001  . COLONOSCOPY    . CORONARY STENT INTERVENTION N/A 07/28/2017   Procedure: CORONARY STENT INTERVENTION;  Surgeon: Troy Sine, MD;  Location: [facility];  Service: Cardiovascular;  Laterality: N/A;  RCA  . LEFT HEART CATH AND CORONARY ANGIOGRAPHY N/A 07/28/2017   Procedure: LEFT HEART CATH AND CORONARY ANGIOGRAPHY;  Surgeon: Troy Sine, MD;  Location: [facility];  Service: Cardiovascular;  Laterality: N/A;  . LEFT HEART CATH AND CORONARY ANGIOGRAPHY N/A 09/04/2017   Procedure: LEFT HEART CATH AND CORONARY ANGIOGRAPHY;  Surgeon: Sherren Mocha, MD;  Location: [facility];  Service: Cardiovascular;  Laterality:  N/A;  . LUMBAR LAMINECTOMY/DECOMPRESSION MICRODISCECTOMY  04/25/2011   Procedure: LUMBAR LAMINECTOMY/DECOMPRESSION MICRODISCECTOMY 1 LEVEL;  Surgeon: Elaina Hoops, MD;  Location: [facility];  Service: Neurosurgery;  Laterality: Left;  Left Lumbar Three-Four Laminectomy and Diskectomy  . LUMBAR LAMINECTOMY/DECOMPRESSION MICRODISCECTOMY  04/26/2011   Procedure: LUMBAR LAMINECTOMY/DECOMPRESSION MICRODISCECTOMY 1 LEVEL;  Surgeon: Elaina Hoops, MD;  Location: [place];  Service: Neurosurgery;  Laterality: N/A;  Re-Exploration of Lumbar Wound and Evacuation of Epidural Hematoma  . [place]  2006  . TUBAL LIGATION  2001     Current Meds  Medication Sig  . ALPRAZolam (XANAX) 0.25 MG tablet Take 1 tablet (0.25 mg total) by mouth daily as needed for anxiety.  Marland Kitchen amLODipine (NORVASC) 2.5 MG tablet TAKE 1 TABLET(2.5 MG) BY MOUTH DAILY  . aspirin EC 81 MG EC tablet Take 1 tablet (81 mg total) by mouth daily.  . isosorbide mononitrate (IMDUR) 30 MG 24 hr tablet Take 1 tablet (30 mg total) by mouth daily.  . metoprolol tartrate (LOPRESSOR) 50 MG tablet Take 1 tablet (50 mg total) by mouth 2 (two) times daily.  . nitroGLYCERIN (NITROSTAT) 0.4 MG SL tablet Place 1 tablet (0.4 mg total) under the tongue every 5 (five) minutes as needed for chest pain.  Marland Kitchen REPATHA SURECLICK 063 MG/ML SOAJ INJECT 1 PEN INTO THE SKIN EVERY 14 DAYS AS DIRECTED  . rosuvastatin (CRESTOR) 40 MG tablet TAKE 1 TABLET(40 MG) BY MOUTH AT BEDTIME  . STOOL SOFTENER 100 MG capsule Take 1 capsule by mouth as needed.   . Tetrahydrozoline HCl (VISINE OP) Place 1 drop into both eyes daily as needed (dry eyes).  . trazodone (DESYREL) 300 MG tablet TAKE 1/2 TABLET BY MOUTH EVERY NIGHT AT BEDTIME  . [DISCONTINUED] clopidogrel (PLAVIX) 75 MG tablet TAKE 1 TABLET(75 MG) BY MOUTH DAILY  . [DISCONTINUED] isosorbide mononitrate (IMDUR) 60 MG 24 hr tablet Take 1 tablet (60 mg total) by mouth daily.     Allergies:   Latex and Tape   Social  History   Tobacco Use  . Smoking status: Former Smoker    Packs/day: 0.10    Years: 2.00    Pack years: 0.20    Types: Cigarettes  . Smokeless tobacco: Never Used  . Tobacco comment: quit smoking in college  Substance Use Topics  . Alcohol use: Yes    Alcohol/week: 6.0 standard drinks    Types: 6 Glasses of wine per week  . Drug use: Not Currently    Types: Marijuana    Comment: 07/24/2017 "in college"     Family Hx: The patient's family history includes Anesthesia problems in her mother and sister; Atrial fibrillation in her father; Cancer in her sister; Colon cancer (age of onset: 77) in her sister; Coronary artery disease in her father; Diabetes in her father; Heart disease in her father; Hyperlipidemia in her brother, father, mother, and sister; Hypertension in her father and mother; Thyroid disease in her sister.  ROS:   Please see the history of present illness.    All other systems  reviewed and are negative.   Prior CV studies:   The following studies were reviewed today:  Labs/Other Tests and Data Reviewed:    EKG:  An ECG dated 03/24/2018 was personally reviewed today and demonstrated:  Normal sinus rhythm, J-point elevation consistent with early repolarization  Recent Labs: 09/03/2017: BUN 20; Creatinine, Ser 0.79; Hemoglobin 13.1; Platelets 178; Potassium 4.0; Sodium 141   Recent Lipid Panel Lab Results  Component Value Date/Time   CHOL 263 (H) 07/25/2017 10:51 AM   TRIG 58 07/25/2017 10:51 AM   HDL 78 07/25/2017 10:51 AM   CHOLHDL 3.4 07/25/2017 10:51 AM   LDLCALC 173 (H) 07/25/2017 10:51 AM   LDLDIRECT 115 (H) 09/03/2017 12:09 PM   LDLDIRECT 155.5 05/14/2011 09:24 AM    Wt Readings from Last 3 Encounters:  08/26/18 179 lb (81.2 kg)  04/13/18 180 lb (81.6 kg)  03/24/18 179 lb 3.2 oz (81.3 kg)     Objective:    Vital Signs:  BP (!) 141/91 (BP Location: Right Arm)   Pulse 77   Ht 5\' 6"  (1.676 m)   Wt 179 lb (81.2 kg)   BMI 28.89 kg/m    VITAL  SIGNS:  reviewed GEN:  no acute distress EYES:  sclerae anicteric, EOMI - Extraocular Movements Intact RESPIRATORY:  normal respiratory effort, symmetric expansion CARDIOVASCULAR:  no peripheral edema SKIN:  no rash, lesions or ulcers. MUSCULOSKELETAL:  no obvious deformities. NEURO:  alert and oriented x 3, no obvious focal deficit PSYCH:  normal affect  ASSESSMENT & PLAN:    1. CAD: She is not really had any symptoms of coronary insufficiency since last year despite being physically active.  I think the vasodilators that she takes make her vulnerable to the heat and we will try to wean down the isosorbide.  Reduce the dose to 30 mg once daily, stop altogether if there is no angina recurrence in the next month or so. Stop clopidogrel 2. HFH: Exceptional response to combination therapy with statin plus Repatha.  All lipid parameters are in target range. 3.  HTN: Usually well-controlled.  A little high today.  She is known to have reactive hypertension.  Will continue both the metoprolol and amlodipine if possible. 4.  PVCs: Have not bothered her much recently.  The palpitations she had the other day sound like sinus tachycardia, not ventricular arrhythmia and were probably precipitated by dehydration and heat.  COVID-19 Education: The signs and symptoms of COVID-19 were discussed with the patient and how to seek care for testing (follow up with PCP or arrange E-visit).  The importance of social distancing was discussed today.  Time:   Today, I have spent 22 minutes with the patient with telehealth technology discussing the above problems.     Medication Adjustments/Labs and Tests Ordered: Current medicines are reviewed at length with the patient today.  Concerns regarding medicines are outlined above.   Tests Ordered: No orders of the defined types were placed in this encounter.   Medication Changes: Meds ordered this encounter  Medications  . isosorbide mononitrate (IMDUR) 30 MG 24  hr tablet    Sig: Take 1 tablet (30 mg total) by mouth daily.   Patient Instructions  Medication Instructions:  STOP the Clopidogrel (Plavix) DECREASE the Isosorbide (Imdur) to 30 mg once daily  If you need a refill on your cardiac medications before your next appointment, please call your pharmacy.   Lab work: None ordered If you have labs (blood work) drawn today and your tests  are completely normal, you will receive your results only by: Marland Kitchen MyChart Message (if you have MyChart) OR . A paper copy in the mail If you have any lab test that is abnormal or we need to change your treatment, we will call you to review the results.  Testing/Procedures: None ordered  Follow-Up: At [facility], you and your health needs are our priority.  As part of our continuing mission to provide you with exceptional heart care, we have created designated Provider Care Teams.  These Care Teams include your primary Cardiologist (physician) and Advanced Practice Providers (APPs -  Physician Assistants and Nurse Practitioners) who all work together to provide you with the care you need, when you need it. You will need a follow up appointment in 12 months.  Please call our office 2 months in advance to schedule this appointment.  You may see Sanda Klein, MD or one of the following Advanced Practice Providers on your designated Care Team:   Kerin Ransom, PA-C [place], [place] . Sande Rives, PA-C  Any Other Special Instructions Will Be Listed Below (If Applicable). Call the office in 1-2 months to discuss stopping the Isosorbide (Imdur) at [phone]     Follow Up:  Virtual Visit or In Person 1 year  Signed, Sanda Klein, MD  08/26/2018 9:17 AM    [place]

## 2018-08-27 ENCOUNTER — Other Ambulatory Visit: Payer: Self-pay | Admitting: Internal Medicine

## 2018-08-27 DIAGNOSIS — Z1231 Encounter for screening mammogram for malignant neoplasm of breast: Secondary | ICD-10-CM

## 2018-10-13 ENCOUNTER — Ambulatory Visit: Payer: BC Managed Care – PPO

## 2018-11-04 ENCOUNTER — Other Ambulatory Visit: Payer: Self-pay | Admitting: Women's Health

## 2018-11-04 NOTE — Telephone Encounter (Signed)
[place] for refill has been taking for > 8 years.

## 2018-11-04 NOTE — Telephone Encounter (Signed)
Last filled on 08/13/18 with 0 refills.

## 2018-11-17 ENCOUNTER — Telehealth: Payer: Self-pay | Admitting: Cardiovascular Disease

## 2018-11-17 NOTE — Telephone Encounter (Signed)
°  Patient has sent a MyChart message as well   Pt c/o of Chest Pain: STAT if CP now or developed within 24 hours  1. Are you having CP right now? no 2. Are you experiencing any other symptoms (ex. SOB, nausea, vomiting, sweating)? SOB at thimes 3. How long have you been experiencing CP? 2 days  4. Is your CP continuous or coming and going? Coming and going  5. Have you taken Nitroglycerin? no ?

## 2018-11-17 NOTE — Telephone Encounter (Signed)
Patient states for the past 24 hours she has noticed some coming and going chest pain in the left side of her chest.  Patient states it started yesterday morning, she states she does have some SOB and had slight pains yesterday but she went for a walk anyway- patient states throughout the day and night last night the pains worsened. She states it feels like 'electrical pulses'. She states that this morning she was unloading the dishwasher, and it came over her very quickly and went from her chest feeling into her back area; pain was an 8, and only lasted a few seconds and went away. Patient denies swelling in hands/feet. Patient states she has not taken a nitroglycerin, because the pains in her chest do not last very long. Its quick and goes away; plus she does not like the nitroglycerin and how it makes her feel.  Patient denies any current chest pain, but she states she could have one at any time, very random at different times even with or without activity.  Patient is checking BP while on the phone with me and it was: 144/101 HR 76. (with morning meds) I advised patient if the pain comes back at the severity of this morning to go to ER to be evaluated, advised patient I would send a message to MD for recommendations- advised to drink water and I would call back. Patient verbalized understanding.

## 2018-11-18 NOTE — Telephone Encounter (Signed)
Tried to call, had to leave voice mail. I sent her a detailed response via mychart. I would like her to come in so we can do an ECG. (APP is OK)

## 2018-11-19 ENCOUNTER — Encounter: Payer: Self-pay | Admitting: *Deleted

## 2018-11-19 NOTE — Telephone Encounter (Signed)
MyChart message sent to the patient to see if she was available for an appointment on 10/19 at 8:20 with Dr. Sallyanne Kuster.

## 2018-11-20 NOTE — Telephone Encounter (Signed)
Appointment made for 10/20 at 10 am. Patient has verbalized her understanding.

## 2018-11-24 ENCOUNTER — Encounter: Payer: Self-pay | Admitting: Cardiovascular Disease

## 2018-11-24 ENCOUNTER — Other Ambulatory Visit: Payer: Self-pay

## 2018-11-24 ENCOUNTER — Ambulatory Visit: Payer: BC Managed Care – PPO | Admitting: Cardiovascular Disease

## 2018-11-24 VITALS — BP 118/80 | HR 69 | Ht 66.0 in | Wt 190.4 lb

## 2018-11-24 DIAGNOSIS — E7801 Familial hypercholesterolemia: Secondary | ICD-10-CM

## 2018-11-24 DIAGNOSIS — I25118 Atherosclerotic heart disease of native coronary artery with other forms of angina pectoris: Secondary | ICD-10-CM | POA: Diagnosis not present

## 2018-11-24 DIAGNOSIS — I493 Ventricular premature depolarization: Secondary | ICD-10-CM

## 2018-11-24 DIAGNOSIS — I10 Essential (primary) hypertension: Secondary | ICD-10-CM | POA: Diagnosis not present

## 2018-11-24 NOTE — Patient Instructions (Addendum)
Medication Instructions:  TAKE Amlodipine in the evening  *If you need a refill on your cardiac medications before your next appointment, please call your pharmacy*  Lab Work: None ordered If you have labs (blood work) drawn today and your tests are completely normal, you will receive your results only by: Marland Kitchen MyChart Message (if you have MyChart) OR . A paper copy in the mail If you have any lab test that is abnormal or we need to change your treatment, we will call you to review the results.  Testing/Procedures: Your physician has requested that you have a lexiscan myoview. For further information please visit [email or web address]. Please follow instruction sheet, as given.  No medications to hold  Follow-Up: Follow up after the [place] with Dr. Sallyanne Kuster. This may be a virtual appointment  Other Instructions  How to prepare for your Myoview test:  1. Do not eat or drink after midnight 2. No caffeine for 24 hours prior to test 3. No smoking 24 hours prior to test. 4. Your medication may be taken with water.  If your doctor stopped a medication because of this test, do not take that medication. 5. Ladies, please do not wear dresses.  Skirts or pants are appropriate. Please wear a short sleeve shirt. 6. No perfume, cologne or lotion. 7. Wear comfortable walking shoes. No heels!

## 2018-11-24 NOTE — Progress Notes (Signed)
Cardiology Office Note:    Date:  11/25/2018   ID:  Nancy Sparks, DOB 1965-06-04, MRN [id number]  PCP:  Haywood Pao, MD  Cardiologist:  Sanda Klein, MD  Electrophysiologist:  None   Referring MD: Haywood Pao, MD   Chief Complaint  Patient presents with  . Coronary Artery Disease    Exertional chest pain and dyspnea    History of Present Illness:    Nancy Sparks is a 53 y.o. female with a hx of premature onset coronary artery disease presenting with unstable angina and placement of a drug-eluting stent to the right coronary artery in June 2019.  In addition there was a 75% stenosis and a relatively small first diagonal artery, left for medical therapy.  She has significant hyperlipidemia but no other coronary risk factors.  Left ventricular systolic function is preserved.  Baseline LDL was 173, she very likely has heterozygous familial hypercholesterolemia.  On rosuvastatin maximum dose her LDL cholesterol was still elevated at 115.  She has started treatment with Repatha in addition to high-dose rosuvastatin, well-tolerated so far.  Her acute coronary event happened just as she was getting ready [place]  They have now rescheduled this for Christmas.  Past Medical History:  Diagnosis Date  . Anemia    "w/pregnancies  . Anxiety    hx  . CAD (coronary artery disease)    LHC 6/19: pLAD, oD1 75, pRCA 20, mRCA 90 >> PCI: DES to RCA // Echo 6/19: EF 55-60, normal wall motion  . Complication of anesthesia    itching relieved with benadryl w/all my surgeries (07/24/2017)  . Depression    hx  . Dyslipidemia   . Fibroid   . GERD (gastroesophageal reflux disease)   . Heart murmur    hx  . Heart palpitations    hx of  . Hx of adenomatous polyp of colon 12/24/2002  . Hypertension     Past Surgical History:  Procedure Laterality Date  . BACK SURGERY    . [place]; 1998; 2001  . COLONOSCOPY    .  CORONARY STENT INTERVENTION N/A 07/28/2017   Procedure: CORONARY STENT INTERVENTION;  Surgeon: Troy Sine, MD;  Location: [facility];  Service: Cardiovascular;  Laterality: N/A;  RCA  . LEFT HEART CATH AND CORONARY ANGIOGRAPHY N/A 07/28/2017   Procedure: LEFT HEART CATH AND CORONARY ANGIOGRAPHY;  Surgeon: Troy Sine, MD;  Location: [facility];  Service: Cardiovascular;  Laterality: N/A;  . LEFT HEART CATH AND CORONARY ANGIOGRAPHY N/A 09/04/2017   Procedure: LEFT HEART CATH AND CORONARY ANGIOGRAPHY;  Surgeon: Sherren Mocha, MD;  Location: [facility];  Service: Cardiovascular;  Laterality: N/A;  . LUMBAR LAMINECTOMY/DECOMPRESSION MICRODISCECTOMY  04/25/2011   Procedure: LUMBAR LAMINECTOMY/DECOMPRESSION MICRODISCECTOMY 1 LEVEL;  Surgeon: Elaina Hoops, MD;  Location: [place];  Service: Neurosurgery;  Laterality: Left;  Left Lumbar Three-Four Laminectomy and Diskectomy  . LUMBAR LAMINECTOMY/DECOMPRESSION MICRODISCECTOMY  04/26/2011   Procedure: LUMBAR LAMINECTOMY/DECOMPRESSION MICRODISCECTOMY 1 LEVEL;  Surgeon: Elaina Hoops, MD;  Location: [facility];  Service: Neurosurgery;  Laterality: N/A;  Re-Exploration of Lumbar Wound and Evacuation of Epidural Hematoma  . [place]  2006  . TUBAL LIGATION  2001    Current Medications: Current Meds  Medication Sig  . ALPRAZolam (XANAX) 0.25 MG tablet Take 1 tablet (0.25 mg total) by mouth daily as needed for anxiety.  Marland Kitchen  amLODipine (NORVASC) 2.5 MG tablet TAKE 1 TABLET(2.5 MG) BY MOUTH DAILY  . aspirin EC 81 MG EC tablet Take 1 tablet (81 mg total) by mouth daily.  . isosorbide mononitrate (IMDUR) 30 MG 24 hr tablet Take 1 tablet (30 mg total) by mouth daily.  . metoprolol tartrate (LOPRESSOR) 50 MG tablet Take 1 tablet (50 mg total) by mouth 2 (two) times daily.  . nitroGLYCERIN (NITROSTAT) 0.4 MG SL tablet Place 1 tablet (0.4 mg total) under the tongue every 5 (five) minutes as needed for chest pain.  Marland Kitchen REPATHA  SURECLICK [id number] MG/ML SOAJ INJECT 1 PEN INTO THE SKIN EVERY 14 DAYS AS DIRECTED  . rosuvastatin (CRESTOR) 40 MG tablet TAKE 1 TABLET(40 MG) BY MOUTH AT BEDTIME  . STOOL SOFTENER 100 MG capsule Take 1 capsule by mouth as needed.   . Tetrahydrozoline HCl (VISINE OP) Place 1 drop into both eyes daily as needed (dry eyes).  . trazodone (DESYREL) 300 MG tablet TAKE 1/2 TABLET BY MOUTH EVERY NIGHT AT BEDTIME     Allergies:   Latex and Tape   Social History   Socioeconomic History  . Marital status: Married    Spouse name: Not on file  . Number of children: 3  . Years of education: Not on file  . Highest education level: Not on file  Occupational History  . Occupation: stay at home mom  Social Needs  . Financial resource strain: Not on file  . Food insecurity    Worry: Not on file    Inability: Not on file  . Transportation needs    Medical: Not on file    Non-medical: Not on file  Tobacco Use  . Smoking status: Former Smoker    Packs/day: 0.10    Years: 2.00    Pack years: 0.20    Types: Cigarettes  . Smokeless tobacco: Never Used  . Tobacco comment: quit smoking in college  Substance and Sexual Activity  . Alcohol use: Yes    Alcohol/week: 6.0 standard drinks    Types: 6 Glasses of wine per week  . Drug use: Not Currently    Types: Marijuana    Comment: 07/24/2017 "in college"  . Sexual activity: Yes    Birth control/protection: Surgical  Lifestyle  . Physical activity    Days per week: Not on file    Minutes per session: Not on file  . Stress: Not on file  Relationships  . Social Herbalist on phone: Not on file    Gets together: Not on file    Attends religious service: Not on file    Active member of club or organization: Not on file    Attends meetings of clubs or organizations: Not on file    Relationship status: Not on file  Other Topics Concern  . Not on file  Social History Narrative   Married to [place]   2 daughters and 1 son   Former Surveyor, minerals - homemaker now   6 drinks/week, no tobacco, no drug use        Family History: The patient's family history includes Anesthesia problems in her mother and sister; Atrial fibrillation in her father; Cancer in her sister; Colon cancer (age of onset: 30) in her sister; Coronary artery disease in her father; Diabetes in her father; Heart disease in her father; Hyperlipidemia in her brother, father, mother, and sister; Hypertension in her father and mother; Thyroid disease in her sister.  ROS:  Please see the history of present illness.    All other systems reviewed and are negative.  EKGs/Labs/Other Studies Reviewed:      EKG:  EKG is not ordered today.   Recent Labs: No results found for requested labs within last 8760 hours.  Recent Lipid Panel    Component Value Date/Time   CHOL 263 (H) 07/25/2017 1051   TRIG 58 07/25/2017 1051   HDL 78 07/25/2017 1051   CHOLHDL 3.4 07/25/2017 1051   VLDL 12 07/25/2017 1051   LDLCALC 173 (H) 07/25/2017 1051   LDLDIRECT 115 (H) 09/03/2017 1209   LDLDIRECT 155.5 05/14/2011 0924    Physical Exam:    VS:  BP 118/80   Pulse 69   Ht 5\' 6"  (1.676 m)   Wt 190 lb 6.4 oz (86.4 kg)   BMI 30.73 kg/m     Wt Readings from Last 3 Encounters:  11/24/18 190 lb 6.4 oz (86.4 kg)  08/26/18 179 lb (81.2 kg)  04/13/18 180 lb (81.6 kg)     GEN:  Well nourished, well developed in no acute distress HEENT: Normal NECK: No JVD; No carotid bruits LYMPHATICS: No lymphadenopathy CARDIAC: RRR, no murmurs, rubs, gallops RESPIRATORY:  Clear to auscultation without rales, wheezing or rhonchi  ABDOMEN: Soft, non-tender, non-distended MUSCULOSKELETAL:  No edema; No deformity  SKIN: Warm and dry NEUROLOGIC:  Alert and oriented x 3 PSYCHIATRIC:  Normal affect   ASSESSMENT:    1. Heterozygous familial hypercholesterolemia   2. Coronary artery disease of native artery of native heart with stable angina pectoris ([place])   3. Essential hypertension   4.  PVC's (premature ventricular contractions)    PLAN:    In order of problems listed above:  1. CAD: Doing very well with cardiac rehab, planning to return to play tennis.  Asymptomatic with exertion.  For a while she had issues with what appears to be angina at rest, some of it may have been precipitated by anxiety, but it is also possible that she has true coronary vasospasm.  Tolerating dual antiplatelet therapy without bleeding problems.  Continue clopidogrel through June 2020.  Discussed the signs and symptoms of restenosis. 2. HLP: Tolerating combination statin-PCSK9 inhibitor without side effects.  Plan to recheck a lipid profile in about another month. 3. HTN: Excellent control. 4. PVCs: Recently have been much less of a bother.   Medication Adjustments/Labs and Tests Ordered: Current medicines are reviewed at length with the patient today.  Concerns regarding medicines are outlined above.  Orders Placed This Encounter  Procedures  . MYOCARDIAL PERFUSION IMAGING  . EKG 12-Lead   No orders of the defined types were placed in this encounter.   Patient Instructions  Medication Instructions:  TAKE Amlodipine in the evening  *If you need a refill on your cardiac medications before your next appointment, please call your pharmacy*  Lab Work: None ordered If you have labs (blood work) drawn today and your tests are completely normal, you will receive your results only by: Marland Kitchen MyChart Message (if you have MyChart) OR . A paper copy in the mail If you have any lab test that is abnormal or we need to change your treatment, we will call you to review the results.  Testing/Procedures: Your physician has requested that you have a lexiscan myoview. For further information please visit [email or web address]. Please follow instruction sheet, as given.  No medications to hold  Follow-Up: Follow up after the [place] with Dr. Sallyanne Kuster. This may be a virtual  appointment  Other  Instructions  How to prepare for your Myoview test:  5. Do not eat or drink after midnight 6. No caffeine for 24 hours prior to test 7. No smoking 24 hours prior to test. 8. Your medication may be taken with water.  If your doctor stopped a medication because of this test, do not take that medication. 9. Ladies, please do not wear dresses.  Skirts or pants are appropriate. Please wear a short sleeve shirt. 10. No perfume, cologne or lotion. 11. Wear comfortable walking shoes. No heels!       Signed, Sanda Klein, MD  11/25/2018 5:39 PM    [place]

## 2018-11-25 ENCOUNTER — Ambulatory Visit
Admission: RE | Admit: 2018-11-25 | Discharge: 2018-11-25 | Disposition: A | Discharge status: Home / Self Care | Payer: BC Managed Care – PPO | Source: Ambulatory Visit | Attending: Internal Medicine | Admitting: Internal Medicine

## 2018-11-25 ENCOUNTER — Encounter: Payer: Self-pay | Admitting: Cardiovascular Disease

## 2018-11-25 DIAGNOSIS — Z1231 Encounter for screening mammogram for malignant neoplasm of breast: Secondary | ICD-10-CM | POA: Diagnosis not present

## 2018-11-25 NOTE — Progress Notes (Signed)
Cardiology Office Note:    Date:  11/25/2018   ID:  Nancy Sparks, DOB [birth year], MRN [id number]  PCP:  Haywood Pao, MD  Cardiologist:  Sanda Klein, MD  Electrophysiologist:  None   Referring MD: Haywood Pao, MD   No chief complaint on file.   History of Present Illness:    Nancy Sparks is a 53 y.o. female with a hx of premature onset coronary artery disease presenting with unstable angina and placement of a drug-eluting stent to the right coronary artery in June 2019.  In addition there was a 75% stenosis in in a relatively small first diagonal artery, left for medical therapy.  She has significant hyperlipidemia but no other coronary risk factors.  Left ventricular systolic function is preserved.  Only she is developed exertional chest tightness and shortness of breath.  She no longer clim[place] get [place] but [place].  She developed similar problem when she has to walk uphill although she can walk on flat terrain for up to 3 miles without symptoms.  She can play doubles tennis but has not played singles.  He denies leg edema, claudication, syncope or palpitations, focal neurological complaints or any orthopnea or PND.  Baseline LDL was 173, she very likely has heterozygous familial hypercholesterolemia.  On rosuvastatin maximum dose her LDL cholesterol was still elevated at 115.  She has started treatment with Repatha in addition to high-dose rosuvastatin, well-tolerated and with excellent additional reduction in LDL.  Her acute coronary event happened just as she was getting [place]  They have now rescheduled this for Christmas.  Past Medical History:  Diagnosis Date  . Anemia    "w/pregnancies  . Anxiety    hx  . CAD (coronary artery disease)    LHC 6/19: pLAD, oD1 75, pRCA 20, mRCA 90 >> PCI: DES to RCA // Echo 6/19: EF 55-60, normal wall motion  .  Complication of anesthesia    itching relieved with benadryl w/all my surgeries (07/24/2017)  . Depression    hx  . Dyslipidemia   . Fibroid   . GERD (gastroesophageal reflux disease)   . Heart murmur    hx  . Heart palpitations    hx of  . Hx of adenomatous polyp of colon 12/24/2002  . Hypertension     Past Surgical History:  Procedure Laterality Date  . BACK SURGERY    . [place]; 1998; 2001  . COLONOSCOPY    . CORONARY STENT INTERVENTION N/A 07/28/2017   Procedure: CORONARY STENT INTERVENTION;  Surgeon: Troy Sine, MD;  Location: [facility];  Service: Cardiovascular;  Laterality: N/A;  RCA  . LEFT HEART CATH AND CORONARY ANGIOGRAPHY N/A 07/28/2017   Procedure: LEFT HEART CATH AND CORONARY ANGIOGRAPHY;  Surgeon: Troy Sine, MD;  Location: [facility];  Service: Cardiovascular;  Laterality: N/A;  . LEFT HEART CATH AND CORONARY ANGIOGRAPHY N/A 09/04/2017   Procedure: LEFT HEART CATH AND CORONARY ANGIOGRAPHY;  Surgeon: Sherren Mocha, MD;  Location: [facility];  Service: Cardiovascular;  Laterality: N/A;  . LUMBAR LAMINECTOMY/DECOMPRESSION MICRODISCECTOMY  04/25/2011   Procedure: LUMBAR LAMINECTOMY/DECOMPRESSION MICRODISCECTOMY 1 LEVEL;  Surgeon: Elaina Hoops, MD;  Location: [facility];  Service: Neurosurgery;  Laterality: Left;  Left Lumbar Three-Four Laminectomy and Diskectomy  . LUMBAR LAMINECTOMY/DECOMPRESSION MICRODISCECTOMY  04/26/2011  Procedure: LUMBAR LAMINECTOMY/DECOMPRESSION MICRODISCECTOMY 1 LEVEL;  Surgeon: Elaina Hoops, MD;  Location: [facility];  Service: Neurosurgery;  Laterality: N/A;  Re-Exploration of Lumbar Wound and Evacuation of Epidural Hematoma  . [place]  2006  . TUBAL LIGATION  2001    Current Medications: Current Meds  Medication Sig  . ALPRAZolam (XANAX) 0.25 MG tablet Take 1 tablet (0.25 mg total) by mouth daily as needed for anxiety.  Marland Kitchen amLODipine (NORVASC) 2.5 MG tablet TAKE 1 TABLET(2.5 MG) BY  MOUTH DAILY  . aspirin EC 81 MG EC tablet Take 1 tablet (81 mg total) by mouth daily.  . isosorbide mononitrate (IMDUR) 30 MG 24 hr tablet Take 1 tablet (30 mg total) by mouth daily.  . metoprolol tartrate (LOPRESSOR) 50 MG tablet Take 1 tablet (50 mg total) by mouth 2 (two) times daily.  . nitroGLYCERIN (NITROSTAT) 0.4 MG SL tablet Place 1 tablet (0.4 mg total) under the tongue every 5 (five) minutes as needed for chest pain.  Marland Kitchen REPATHA SURECLICK [id number] MG/ML SOAJ INJECT 1 PEN INTO THE SKIN EVERY 14 DAYS AS DIRECTED  . rosuvastatin (CRESTOR) 40 MG tablet TAKE 1 TABLET(40 MG) BY MOUTH AT BEDTIME  . STOOL SOFTENER 100 MG capsule Take 1 capsule by mouth as needed.   . Tetrahydrozoline HCl (VISINE OP) Place 1 drop into both eyes daily as needed (dry eyes).  . trazodone (DESYREL) 300 MG tablet TAKE 1/2 TABLET BY MOUTH EVERY NIGHT AT BEDTIME     Allergies:   Latex and Tape   Social History   Socioeconomic History  . Marital status: Married    Spouse name: Not on file  . Number of children: 3  . Years of education: Not on file  . Highest education level: Not on file  Occupational History  . Occupation: stay at home mom  Social Needs  . Financial resource strain: Not on file  . Food insecurity    Worry: Not on file    Inability: Not on file  . Transportation needs    Medical: Not on file    Non-medical: Not on file  Tobacco Use  . Smoking status: Former Smoker    Packs/day: 0.10    Years: 2.00    Pack years: 0.20    Types: Cigarettes  . Smokeless tobacco: Never Used  . Tobacco comment: quit smoking in college  Substance and Sexual Activity  . Alcohol use: Yes    Alcohol/week: 6.0 standard drinks    Types: 6 Glasses of wine per week  . Drug use: Not Currently    Types: Marijuana    Comment: 07/24/2017 "in college"  . Sexual activity: Yes    Birth control/protection: Surgical  Lifestyle  . Physical activity    Days per week: Not on file    Minutes per session: Not on file  .  Stress: Not on file  Relationships  . Social Herbalist on phone: Not on file    Gets together: Not on file    Attends religious service: Not on file    Active member of club or organization: Not on file    Attends meetings of clubs or organizations: Not on file    Relationship status: Not on file  Other Topics Concern  . Not on file  Social History Narrative   Married to [place]   2 daughters and 1 son   Former Education officer, museum - homemaker now   6 drinks/week, no tobacco, no drug use  Family History: The patient's family history includes Anesthesia problems in her mother and sister; Atrial fibrillation in her father; Cancer in her sister; Colon cancer (age of onset: 66) in her sister; Coronary artery disease in her father; Diabetes in her father; Heart disease in her father; Hyperlipidemia in her brother, father, mother, and sister; Hypertension in her father and mother; Thyroid disease in her sister.  ROS:   Please see the history of present illness.    All other systems reviewed and are negative.  EKGs/Labs/Other Studies Reviewed:      EKG:  EKG is ordered today and shows normal sinus rhythm with mild early repolarization changes  Recent Labs: No results found for requested labs within last 8760 hours.  Labs from [place] from August 18, 2018 show glucose 98, creatinine 0.7, potassium 4.3, normal liver function tests, hemoglobin 12.6  Total cholesterol 131, triglycerides 72, HDL 68, LDL 49 Recent Lipid Panel    Component Value Date/Time   CHOL 263 (H) 07/25/2017 1051   TRIG 58 07/25/2017 1051   HDL 78 07/25/2017 1051   CHOLHDL 3.4 07/25/2017 1051   VLDL 12 07/25/2017 1051   LDLCALC 173 (H) 07/25/2017 1051   LDLDIRECT 115 (H) 09/03/2017 1209   LDLDIRECT 155.5 05/14/2011 0924    Physical Exam:    VS:  BP 118/80   Pulse 69   Ht 5\' 6"  (1.676 m)   Wt 190 lb 6.4 oz (86.4 kg)   BMI 30.73 kg/m     Wt Readings from Last 3 Encounters:  11/24/18  190 lb 6.4 oz (86.4 kg)  08/26/18 179 lb (81.2 kg)  04/13/18 180 lb (81.6 kg)      General: Alert, oriented x3, no distress, she has gained weight and is now borderline obese Head: no evidence of trauma, PERRL, EOMI, no exophtalmos or lid lag, no myxedema, no xanthelasma; normal ears, nose and oropharynx Neck: normal jugular venous pulsations and no hepatojugular reflux; brisk carotid pulses without delay and no carotid bruits Chest: clear to auscultation, no signs of consolidation by percussion or palpation, normal fremitus, symmetrical and full respiratory excursions Cardiovascular: normal position and quality of the apical impulse, regular rhythm, normal first and second heart sounds, no murmurs, rubs or gallops Abdomen: no tenderness or distention, no masses by palpation, no abnormal pulsatility or arterial bruits, normal bowel sounds, no hepatosplenomegaly Extremities: no clubbing, cyanosis or edema; 2+ radial, ulnar and brachial pulses bilaterally; 2+ right femoral, posterior tibial and dorsalis pedis pulses; 2+ left femoral, posterior tibial and dorsalis pedis pulses; no subclavian or femoral bruits Neurological: grossly nonfocal Psych: Normal mood and affect   ASSESSMENT:    1. Heterozygous familial hypercholesterolemia   2. Coronary artery disease of native artery of native heart with stable angina pectoris ([place])   3. Essential hypertension   4. PVC's (premature ventricular contractions)    PLAN:    In order of problems listed above:  1. CAD: She seems to have developed CCS functional class II exertional angina pectoris.  She is on 3 different antianginals with the dose is somewhat limited by her blood pressure.  Schedule for [facility] since she may have in-stent restenosis.  May have to use ranolazine if blood pressure does not allow escalation of conventional antianginals. 2. HLP: Values consistent with familial heterozygous hypercholesterolemia.  Tolerating combination  statin-PCSK9 inhibitor without side effects.  Plan to recheck a lipid profile in about another month. 3. HTN: Excellent control. 4. PVCs: Recently have been much less of  a bother.   Medication Adjustments/Labs and Tests Ordered: Current medicines are reviewed at length with the patient today.  Concerns regarding medicines are outlined above.  Orders Placed This Encounter  Procedures  . MYOCARDIAL PERFUSION IMAGING  . EKG 12-Lead   No orders of the defined types were placed in this encounter.   Patient Instructions  Medication Instructions:  TAKE Amlodipine in the evening  *If you need a refill on your cardiac medications before your next appointment, please call your pharmacy*  Lab Work: None ordered If you have labs (blood work) drawn today and your tests are completely normal, you will receive your results only by: Marland Kitchen MyChart Message (if you have MyChart) OR . A paper copy in the mail If you have any lab test that is abnormal or we need to change your treatment, we will call you to review the results.  Testing/Procedures: Your physician has requested that you have a lexiscan myoview. For further information please visit [email or web address]. Please follow instruction sheet, as given.  No medications to hold  Follow-Up: Follow up after the [place] with Dr. Sallyanne Kuster. This may be a virtual appointment  Other Instructions  How to prepare for your Myoview test:  5. Do not eat or drink after midnight 6. No caffeine for 24 hours prior to test 7. No smoking 24 hours prior to test. 8. Your medication may be taken with water.  If your doctor stopped a medication because of this test, do not take that medication. 9. Ladies, please do not wear dresses.  Skirts or pants are appropriate. Please wear a short sleeve shirt. 10. No perfume, cologne or lotion. 11. Wear comfortable walking shoes. No heels!       Signed, Sanda Klein, MD  11/25/2018 5:38 PM    [place]

## 2018-12-03 ENCOUNTER — Telehealth (HOSPITAL_COMMUNITY): Payer: Self-pay | Admitting: *Deleted

## 2018-12-03 NOTE — Telephone Encounter (Signed)
Close encounter 

## 2018-12-08 ENCOUNTER — Ambulatory Visit (HOSPITAL_COMMUNITY)
Admission: RE | Admit: 2018-12-08 | Discharge: 2018-12-08 | Disposition: A | Discharge status: Home / Self Care | Payer: BC Managed Care – PPO | Source: Ambulatory Visit | Attending: Cardiology | Admitting: Cardiology

## 2018-12-08 ENCOUNTER — Other Ambulatory Visit: Payer: Self-pay

## 2018-12-08 DIAGNOSIS — I25118 Atherosclerotic heart disease of native coronary artery with other forms of angina pectoris: Secondary | ICD-10-CM | POA: Diagnosis not present

## 2018-12-08 LAB — MYOCARDIAL PERFUSION IMAGING
LV dias vol: 78 mL (ref 46–106)
LV sys vol: 28 mL
Peak HR: 100 {beats}/min
Rest HR: 75 {beats}/min
SDS: 0
SRS: 0
SSS: 0
TID: 1.2

## 2018-12-08 MED ORDER — AMINOPHYLLINE 25 MG/ML IV SOLN
75.0000 mg | Freq: Once | INTRAVENOUS | Status: AC
  Administered 2018-12-08: 75 mg via INTRAVENOUS

## 2018-12-08 MED ORDER — REGADENOSON 0.4 MG/5ML IV SOLN
0.4000 mg | Freq: Once | INTRAVENOUS | Status: AC
  Administered 2018-12-08: 0.4 mg via INTRAVENOUS

## 2018-12-08 MED ORDER — TECHNETIUM TC 99M TETROFOSMIN IV KIT
32.0000 | PACK | Freq: Once | INTRAVENOUS | Status: AC | PRN
  Administered 2018-12-08: 32 via INTRAVENOUS
  Filled 2018-12-08: qty 32

## 2018-12-08 MED ORDER — TECHNETIUM TC 99M TETROFOSMIN IV KIT
10.6000 | PACK | Freq: Once | INTRAVENOUS | Status: AC | PRN
  Administered 2018-12-08: 10.6 via INTRAVENOUS
  Filled 2018-12-08: qty 11

## 2019-01-05 DIAGNOSIS — Z23 Encounter for immunization: Secondary | ICD-10-CM | POA: Diagnosis not present

## 2019-01-05 DIAGNOSIS — R635 Abnormal weight gain: Secondary | ICD-10-CM | POA: Diagnosis not present

## 2019-01-20 ENCOUNTER — Encounter: Payer: Self-pay | Admitting: Cardiovascular Disease

## 2019-01-20 ENCOUNTER — Other Ambulatory Visit: Payer: Self-pay

## 2019-01-20 ENCOUNTER — Ambulatory Visit: Payer: BC Managed Care – PPO | Attending: Internal Medicine

## 2019-01-20 ENCOUNTER — Telehealth (INDEPENDENT_AMBULATORY_CARE_PROVIDER_SITE_OTHER): Payer: BC Managed Care – PPO | Admitting: Cardiovascular Disease

## 2019-01-20 DIAGNOSIS — E7801 Familial hypercholesterolemia: Secondary | ICD-10-CM | POA: Diagnosis not present

## 2019-01-20 DIAGNOSIS — I10 Essential (primary) hypertension: Secondary | ICD-10-CM | POA: Diagnosis not present

## 2019-01-20 DIAGNOSIS — R002 Palpitations: Secondary | ICD-10-CM

## 2019-01-20 DIAGNOSIS — I25119 Atherosclerotic heart disease of native coronary artery with unspecified angina pectoris: Secondary | ICD-10-CM | POA: Diagnosis not present

## 2019-01-20 DIAGNOSIS — Z20828 Contact with and (suspected) exposure to other viral communicable diseases: Secondary | ICD-10-CM | POA: Diagnosis not present

## 2019-01-20 DIAGNOSIS — Z20822 Contact with and (suspected) exposure to covid-19: Secondary | ICD-10-CM

## 2019-01-20 MED ORDER — AMLODIPINE BESYLATE 5 MG PO TABS: 5.0000 mg | ORAL_TABLET | Freq: Every day | ORAL | 5 refills | Status: DC

## 2019-01-20 NOTE — Patient Instructions (Addendum)
Medication Instructions:  INCREASE the Amlodipine to 5 mg once daily  *If you need a refill on your cardiac medications before your next appointment, please call your pharmacy*  Lab Work: None ordered If you have labs (blood work) drawn today and your tests are completely normal, you will receive your results only by: Marland Kitchen MyChart Message (if you have MyChart) OR . A paper copy in the mail If you have any lab test that is abnormal or we need to change your treatment, we will call you to review the results.  Testing/Procedures: None ordered  Follow-Up: At [facility], you and your health needs are our priority.  As part of our continuing mission to provide you with exceptional heart care, we have created designated Provider Care Teams.  These Care Teams include your primary Cardiologist (physician) and Advanced Practice Providers (APPs -  Physician Assistants and Nurse Practitioners) who all work together to provide you with the care you need, when you need it.  Your next appointment:   6 month(s)  The format for your next appointment:   In Person  Provider:   Sanda Klein, MD  Other Instructions Dr. Sallyanne Kuster would like you to check your blood pressure DAILY for the next few weeks.  Keep a journal of these daily BP and heart rate readings and call our office or send a message through [place] with the results after the New Year.

## 2019-01-20 NOTE — Progress Notes (Signed)
Virtual Visit via Video Note   This visit type was conducted due to national recommendations for restrictions regarding the COVID-19 Pandemic (e.g. social distancing) in an effort to limit this patient's exposure and mitigate transmission in our community.  Due to her co-morbid illnesses, this patient is at least at moderate risk for complications without adequate follow up.  This format is felt to be most appropriate for this patient at this time.  All issues noted in this document were discussed and addressed.  A limited physical exam was performed with this format.  Please refer to the patient's chart for her consent to telehealth for [facility].   Date:  01/20/2019   ID:  Nancy Sparks, DOB 01-05-1966, MRN [id number]  Patient Location: Home Provider Location: Other:  [place]  PCP:  Haywood Pao, MD  Cardiologist:  Sanda Klein, MD  Electrophysiologist:  None   Evaluation Performed:  Follow-Up Visit  Chief Complaint:  CAD, HTN, [place]  History of Present Illness:    Nancy Sparks is a 53 y.o. female with systemic hypertension and severe hypercholesterolemia almost certainly related to heterozygous familial hypercholesterolemia who presented with unstable angina and received a drug-eluting stent to the right coronary artery in June 2019.  She also had a 75% stenosis in a small first diagonal artery left for medical therapy.  He had recurrent problems with angina after the procedure despite having no evidence of problems on repeat angiography, suggesting she may have a component of coronary vasospasm.    Normal nuclear stress test 12/08/2018.  She has gained weight.  Her blood pressure at home is typically 140-155/75-100, most commonly around 150/90.  She has infrequent episodes of chest discomfort, never reaching the threshold where she might need sublingual nitroglycerin.  She continues to play tennis and walks, but is frustrated that she is gaining weight.  She was  exposed to Covid by her son-in-law about a week ago, is waiting for the results of a test performed earlier today.  The patient specifically denies any chest pain with exertion, dyspnea at rest or with exertion, orthopnea, paroxysmal nocturnal dyspnea, syncope, palpitations, focal neurological deficits, intermittent claudication, lower extremity edema, unexplained weight gain, cough, hemoptysis or wheezing.  The patient does not have symptoms concerning for COVID-19 infection (fever, chills, cough, or new shortness of breath).    Past Medical History:  Diagnosis Date  . Anemia    "w/pregnancies  . Anxiety    hx  . CAD (coronary artery disease)    LHC 6/19: pLAD, oD1 75, pRCA 20, mRCA 90 >> PCI: DES to RCA // Echo 6/19: EF 55-60, normal wall motion  . Complication of anesthesia    itching relieved with benadryl w/all my surgeries (07/24/2017)  . Depression    hx  . Dyslipidemia   . Fibroid   . GERD (gastroesophageal reflux disease)   . Heart murmur    hx  . Heart palpitations    hx of  . Hx of adenomatous polyp of colon 12/24/2002  . Hypertension    Past Surgical History:  Procedure Laterality Date  . BACK SURGERY    . [place]; 1998; 2001  . COLONOSCOPY    . CORONARY STENT INTERVENTION N/A 07/28/2017   Procedure: CORONARY STENT INTERVENTION;  Surgeon: Troy Sine, MD;  Location: [facility];  Service: Cardiovascular;  Laterality: N/A;  RCA  . LEFT HEART CATH AND CORONARY ANGIOGRAPHY N/A 07/28/2017   Procedure: LEFT HEART CATH AND  CORONARY ANGIOGRAPHY;  Surgeon: Troy Sine, MD;  Location: [facility];  Service: Cardiovascular;  Laterality: N/A;  . LEFT HEART CATH AND CORONARY ANGIOGRAPHY N/A 09/04/2017   Procedure: LEFT HEART CATH AND CORONARY ANGIOGRAPHY;  Surgeon: Sherren Mocha, MD;  Location: [facility];  Service: Cardiovascular;  Laterality: N/A;  . LUMBAR LAMINECTOMY/DECOMPRESSION MICRODISCECTOMY  04/25/2011   Procedure: LUMBAR  LAMINECTOMY/DECOMPRESSION MICRODISCECTOMY 1 LEVEL;  Surgeon: Elaina Hoops, MD;  Location: [facility];  Service: Neurosurgery;  Laterality: Left;  Left Lumbar Three-Four Laminectomy and Diskectomy  . LUMBAR LAMINECTOMY/DECOMPRESSION MICRODISCECTOMY  04/26/2011   Procedure: LUMBAR LAMINECTOMY/DECOMPRESSION MICRODISCECTOMY 1 LEVEL;  Surgeon: Elaina Hoops, MD;  Location: [facility];  Service: Neurosurgery;  Laterality: N/A;  Re-Exploration of Lumbar Wound and Evacuation of Epidural Hematoma  . [place]  2006  . TUBAL LIGATION  2001     Current Meds  Medication Sig  . ALPRAZolam (XANAX) 0.25 MG tablet Take 1 tablet (0.25 mg total) by mouth daily as needed for anxiety.  Marland Kitchen amLODipine (NORVASC) 2.5 MG tablet TAKE 1 TABLET(2.5 MG) BY MOUTH DAILY  . aspirin EC 81 MG EC tablet Take 1 tablet (81 mg total) by mouth daily.  . isosorbide mononitrate (IMDUR) 30 MG 24 hr tablet Take 1 tablet (30 mg total) by mouth daily. (Patient taking differently: Take 60 mg by mouth daily. )  . REPATHA SURECLICK XX123456 MG/ML SOAJ INJECT 1 PEN INTO THE SKIN EVERY 14 DAYS AS DIRECTED  . rosuvastatin (CRESTOR) 40 MG tablet TAKE 1 TABLET(40 MG) BY MOUTH AT BEDTIME  . Tetrahydrozoline HCl (VISINE OP) Place 1 drop into both eyes daily as needed (dry eyes).  . trazodone (DESYREL) 300 MG tablet TAKE 1/2 TABLET BY MOUTH EVERY NIGHT AT BEDTIME     Allergies:   Latex and Tape   Social History   Tobacco Use  . Smoking status: Former Smoker    Packs/day: 0.10    Years: 2.00    Pack years: 0.20    Types: Cigarettes  . Smokeless tobacco: Never Used  . Tobacco comment: quit smoking in college  Substance Use Topics  . Alcohol use: Yes    Alcohol/week: 6.0 standard drinks    Types: 6 Glasses of wine per week  . Drug use: Not Currently    Types: Marijuana    Comment: 07/24/2017 "in college"     Family Hx: The patient's family history includes Anesthesia problems in her mother and sister; Atrial fibrillation in her  father; Cancer in her sister; Colon cancer (age of onset: 74) in her sister; Coronary artery disease in her father; Diabetes in her father; Heart disease in her father; Hyperlipidemia in her brother, father, mother, and sister; Hypertension in her father and mother; Thyroid disease in her sister.  ROS:   Please see the history of present illness.    All other systems reviewed and are negative.   Prior CV studies:   The following studies were reviewed today: Lexiscan Myoview 12/08/2018  Nuclear stress EF: 64%. The left ventricular ejection fraction is normal (55-65%).  There was no ST segment deviation noted during stress.  This is a low risk study. There is no evidence of ischemia  The study is normal.     Labs/Other Tests and Data Reviewed:    EKG:  An ECG dated 11/26/2018 was personally reviewed today and demonstrated:  Sinus rhythm, early repolarization, otherwise normal tracing  Recent Labs: 01/05/2019 TSH 3.29 08/18/2018 creatinine 0.7, hemoglobin 12.6,  hemoglobin A1c 5.2%, normal LFTs  Recent Lipid Panel Lab Results  Component Value Date/Time   CHOL 263 (H) 07/25/2017 10:51 AM   TRIG 58 07/25/2017 10:51 AM   HDL 78 07/25/2017 10:51 AM   CHOLHDL 3.4 07/25/2017 10:51 AM   LDLCALC 173 (H) 07/25/2017 10:51 AM   LDLDIRECT 115 (H) 09/03/2017 12:09 PM   LDLDIRECT 155.5 05/14/2011 09:24 AM    Wt Readings from Last 3 Encounters:  01/20/19 188 lb (85.3 kg)  12/08/18 190 lb (86.2 kg)  11/24/18 190 lb 6.4 oz (86.4 kg)     Objective:    Vital Signs:  BP (!) 129/92   Pulse 69   Ht 5\' 6"  (1.676 m)   Wt 188 lb (85.3 kg)   BMI 30.34 kg/m    VITAL SIGNS:  reviewed GEN:  no acute distress EYES:  sclerae anicteric, EOMI - Extraocular Movements Intact RESPIRATORY:  normal respiratory effort, symmetric expansion CARDIOVASCULAR:  no peripheral edema SKIN:  no rash, lesions or ulcers. MUSCULOSKELETAL:  no obvious deformities. NEURO:  alert and oriented x 3, no obvious focal  deficit PSYCH:  normal affect  ASSESSMENT & PLAN:    1. CAD:  essentially asymptomatic (CCS class I-2) on a low-dose of nitrates and amlodipine. I am not sure whether the occasional chest discomfort he experiences is truly angina.  Continue aspirin and aggressive lipid-lowering therapy.   2. HFH: Exceptional response to combination therapy with statin plus Repatha.  Excellent lipid profile with LDL 49. 3.  HTN: Blood pressure is now persistently elevated, may be due to weight gain.  Encouraged her to continue being physically active and keep pursuing weight loss even though it has been challenging.  Increase amlodipine to 5 mg once daily.  Recheck blood pressure after the holidays. 4.  PVCs: Infrequent and more of a nuisance than a real problem.  Beta-blockers caused excessive fatigue when using higher doses.   COVID-19 Education: The signs and symptoms of COVID-19 were discussed with the patient and how to seek care for testing (follow up with PCP or arrange E-visit).  The importance of social distancing was discussed today.  Time:   Today, I have spent 19 minutes with the patient with telehealth technology discussing the above problems.     Medication Adjustments/Labs and Tests Ordered: Current medicines are reviewed at length with the patient today.  Concerns regarding medicines are outlined above.   Tests Ordered: No orders of the defined types were placed in this encounter.   Medication Changes: No orders of the defined types were placed in this encounter.  There are no Patient Instructions on file for this visit.  Follow Up:  Virtual Visit or In Person 1 year  Signed, Sanda Klein, MD  01/20/2019 9:21 AM    [place]

## 2019-01-21 LAB — NOVEL CORONAVIRUS, NAA: SARS-CoV-2, NAA: NOT DETECTED

## 2019-02-26 DIAGNOSIS — R7301 Impaired fasting glucose: Secondary | ICD-10-CM | POA: Diagnosis not present

## 2019-02-26 DIAGNOSIS — I209 Angina pectoris, unspecified: Secondary | ICD-10-CM | POA: Diagnosis not present

## 2019-02-26 DIAGNOSIS — Z1331 Encounter for screening for depression: Secondary | ICD-10-CM | POA: Diagnosis not present

## 2019-02-26 DIAGNOSIS — I2510 Atherosclerotic heart disease of native coronary artery without angina pectoris: Secondary | ICD-10-CM | POA: Diagnosis not present

## 2019-02-26 DIAGNOSIS — F419 Anxiety disorder, unspecified: Secondary | ICD-10-CM | POA: Diagnosis not present

## 2019-03-08 ENCOUNTER — Other Ambulatory Visit: Payer: Self-pay

## 2019-03-08 MED ORDER — ASPIRIN 81 MG PO TBEC: 81.0000 mg | DELAYED_RELEASE_TABLET | Freq: Every day | ORAL | 3 refills | Status: DC

## 2019-04-28 DIAGNOSIS — R6883 Chills (without fever): Secondary | ICD-10-CM | POA: Diagnosis not present

## 2019-04-28 DIAGNOSIS — R5383 Other fatigue: Secondary | ICD-10-CM | POA: Diagnosis not present

## 2019-04-28 DIAGNOSIS — Z1152 Encounter for screening for COVID-19: Secondary | ICD-10-CM | POA: Diagnosis not present

## 2019-05-31 ENCOUNTER — Other Ambulatory Visit: Payer: Self-pay

## 2019-05-31 ENCOUNTER — Ambulatory Visit: Payer: BC Managed Care – PPO | Admitting: Women's Health

## 2019-05-31 ENCOUNTER — Encounter: Payer: Self-pay | Admitting: Women's Health

## 2019-05-31 VITALS — BP 122/78 | Ht 66.0 in | Wt 187.0 lb

## 2019-05-31 DIAGNOSIS — Z13820 Encounter for screening for osteoporosis: Secondary | ICD-10-CM | POA: Diagnosis not present

## 2019-05-31 DIAGNOSIS — Z01419 Encounter for gynecological examination (general) (routine) without abnormal findings: Secondary | ICD-10-CM | POA: Diagnosis not present

## 2019-05-31 MED ORDER — TRAZODONE HCL 300 MG PO TABS: ORAL_TABLET | ORAL | 4 refills | Status: DC

## 2019-05-31 MED ORDER — ESTRADIOL 10 MCG VA TABS: ORAL_TABLET | VAGINAL | 4 refills | Status: DC

## 2019-05-31 NOTE — Patient Instructions (Signed)
It has been a pleasure knowing you Vitamin D 2000 IUs daily Schedule bone density Repeat colonoscopy 2022 Health Maintenance for Postmenopausal Women Menopause is a normal process in which your ability to get pregnant comes to an end. This process happens slowly over many months or years, usually between the ages of 75 and 70. Menopause is complete when you have missed your menstrual periods for 12 months. It is important to talk with your health care provider about some of the most common conditions that affect women after menopause (postmenopausal women). These include heart disease, cancer, and bone loss (osteoporosis). Adopting a healthy lifestyle and getting preventive care can help to promote your health and wellness. The actions you take can also lower your chances of developing some of these common conditions. What should I know about menopause? During menopause, you may get a number of symptoms, such as:  Hot flashes. These can be moderate or severe.  Night sweats.  Decrease in sex drive.  Mood swings.  Headaches.  Tiredness.  Irritability.  Memory problems.  Insomnia. Choosing to treat or not to treat these symptoms is a decision that you make with your health care provider. Do I need hormone replacement therapy?  Hormone replacement therapy is effective in treating symptoms that are caused by menopause, such as hot flashes and night sweats.  Hormone replacement carries certain risks, especially as you become older. If you are thinking about using estrogen or estrogen with progestin, discuss the benefits and risks with your health care provider. What is my risk for heart disease and stroke? The risk of heart disease, heart attack, and stroke increases as you age. One of the causes may be a change in the body's hormones during menopause. This can affect how your body uses dietary fats, triglycerides, and cholesterol. Heart attack and stroke are medical emergencies. There  are many things that you can do to help prevent heart disease and stroke. Watch your blood pressure  High blood pressure causes heart disease and increases the risk of stroke. This is more likely to develop in people who have high blood pressure readings, are of African descent, or are overweight.  Have your blood pressure checked: ? Every 3-5 years if you are 76-25 years of age. ? Every year if you are 47 years old or older. Eat a healthy diet   Eat a diet that includes plenty of vegetables, fruits, low-fat dairy products, and lean protein.  Do not eat a lot of foods that are high in solid fats, added sugars, or sodium. Get regular exercise Get regular exercise. This is one of the most important things you can do for your health. Most adults should:  Try to exercise for at least 150 minutes each week. The exercise should increase your heart rate and make you sweat (moderate-intensity exercise).  Try to do strengthening exercises at least twice each week. Do these in addition to the moderate-intensity exercise.  Spend less time sitting. Even light physical activity can be beneficial. Other tips  Work with your health care provider to achieve or maintain a healthy weight.  Do not use any products that contain nicotine or tobacco, such as cigarettes, e-cigarettes, and chewing tobacco. If you need help quitting, ask your health care provider.  Know your numbers. Ask your health care provider to check your cholesterol and your blood sugar (glucose). Continue to have your blood tested as directed by your health care provider. Do I need screening for cancer? Depending on your health  history and family history, you may need to have cancer screening at different stages of your life. This may include screening for:  Breast cancer.  Cervical cancer.  Lung cancer.  Colorectal cancer. What is my risk for osteoporosis? After menopause, you may be at increased risk for osteoporosis.  Osteoporosis is a condition in which bone destruction happens more quickly than new bone creation. To help prevent osteoporosis or the bone fractures that can happen because of osteoporosis, you may take the following actions:  If you are 26-68 years old, get at least 1,000 mg of calcium and at least 600 mg of vitamin D per day.  If you are older than age 15 but younger than age 19, get at least 1,200 mg of calcium and at least 600 mg of vitamin D per day.  If you are older than age 11, get at least 1,200 mg of calcium and at least 800 mg of vitamin D per day. Smoking and drinking excessive alcohol increase the risk of osteoporosis. Eat foods that are rich in calcium and vitamin D, and do weight-bearing exercises several times each week as directed by your health care provider. How does menopause affect my mental health? Depression may occur at any age, but it is more common as you become older. Common symptoms of depression include:  Low or sad mood.  Changes in sleep patterns.  Changes in appetite or eating patterns.  Feeling an overall lack of motivation or enjoyment of activities that you previously enjoyed.  Frequent crying spells. Talk with your health care provider if you think that you are experiencing depression. General instructions See your health care provider for regular wellness exams and vaccines. This may include:  Scheduling regular health, dental, and eye exams.  Getting and maintaining your vaccines. These include: ? Influenza vaccine. Get this vaccine each year before the flu season begins. ? Pneumonia vaccine. ? Shingles vaccine. ? Tetanus, diphtheria, and pertussis (Tdap) booster vaccine. Your health care provider may also recommend other immunizations. Tell your health care provider if you have ever been abused or do not feel safe at home. Summary  Menopause is a normal process in which your ability to get pregnant comes to an end.  This condition causes  hot flashes, night sweats, decreased interest in sex, mood swings, headaches, or lack of sleep.  Treatment for this condition may include hormone replacement therapy.  Take actions to keep yourself healthy, including exercising regularly, eating a healthy diet, watching your weight, and checking your blood pressure and blood sugar levels.  Get screened for cancer and depression. Make sure that you are up to date with all your vaccines. This information is not intended to replace advice given to you by your health care provider. Make sure you discuss any questions you have with your health care provider. Document Revised: 01/14/2018 Document Reviewed: 01/14/2018 Elsevier Patient Education  2020 [employer].

## 2019-05-31 NOTE — Progress Notes (Signed)
   Arleni Kwiatek Herbers November 19, 1965 [id number]   History:  54 y.o. presents for annual exam.  Postmenopausal on no HRT with no bleeding.  Normal Pap and mammogram history.  2017 2 benign colon polyps 5-year follow-up, sister  colon cancer survivor.  Primary care manages hypertension, hypercholesteremia, and GERD.  2019 cardiac cath had a stent placed/CAD.  Gynecologic History No LMP recorded. Patient has had an ablation.   Past medical history, past surgical history, family history and social history were all reviewed and documented in the EPIC chart.  2 daughters both doing well ages 55 and 8 graduating from nursing school and 1 son in first year of college.  Family trip to [place] to celebrate middle child's college graduation.  Parents hypertension and hypercholesteremia, father diabetes both deceased in their 55s.  ROS:  A ROS was performed and pertinent positives and negatives are included.  Exam:  Vitals:   05/31/19 1539  BP: 122/78  Weight: 187 lb (84.8 kg)  Height: 5\' 6"  (1.676 m)   Body mass index is 30.18 kg/m.  General appearance:  Normal Thyroid:  Symmetrical, normal in size, without palpable masses or nodularity. Respiratory  Auscultation:  Clear without wheezing or rhonchi Cardiovascular  Auscultation:  Regular rate, without rubs, murmurs or gallops  Edema/varicosities:  Not grossly evident Abdominal  Soft,nontender, without masses, guarding or rebound.  Liver/spleen:  No organomegaly noted  Hernia:  None appreciated  Skin  Inspection:  Grossly normal   Breasts: Examined lying and sitting.   Right: Without masses, retractions, discharge or axillary adenopathy.   Left: Without masses, retractions, discharge or axillary adenopathy. Gentitourinary   Inguinal/mons:  Normal without inguinal adenopathy  External genitalia:  Normal  BUS/Urethra/Skene's glands:  Normal  Vagina: Mild atrophy   Cervix:  Normal  Uterus:  normal in size, shape and contour.  Midline and  mobile  Adnexa/parametria:     Rt: Without masses or tenderness.   Lt: Without masses or tenderness.  Anus and perineum: Normal  Digital rectal exam: Normal sphincter tone without palpated masses or tenderness  Assessment/Plan:  54 y.o. MWF G4, P3 for annual exam with complaint of dyspareunia.  Denies vaginal discharge, urinary symptoms or abdominal pain.  Postmenopausal on no HRT with no bleeding. Mild vaginal atrophy dyspareunia 2019 CAD stent placed Hypertension, hypercholesteremia,-cardiologist manages labs and meds 06/2015 2 benign colon polyps 5-year follow-up(sister colon cancer survivor)  Plan: Options for vaginal dryness discussed no relief with over-the-counter lubricants as used Vagifem in the past with good relief prescription for Vagifem 10 mcg per vagina daily for 2 weeks and then twice weekly after that.  Reviewed minimal systemic absorption.  Best not to use by mouth HRT due to history of heart disease.  Continue over-the-counter lubricants and call if no relief.  SBEs, continue annual 3D screening mammogram, calcium rich foods, vitamin D 2000 IUs daily.  Reviewed importance of weightbearing and balance type exercise.  Schedule DEXA.  Pap normal 03/2017, new screening guidelines reviewed.    Huel Cote [facility], 5:05 PM 05/31/2019

## 2019-07-15 ENCOUNTER — Other Ambulatory Visit: Payer: Self-pay

## 2019-07-15 MED ORDER — ISOSORBIDE MONONITRATE ER 30 MG PO TB24: 30.0000 mg | ORAL_TABLET | Freq: Every day | ORAL | Status: DC

## 2019-07-23 ENCOUNTER — Other Ambulatory Visit: Payer: Self-pay

## 2019-07-23 NOTE — Telephone Encounter (Signed)
This is Dr. Croitoru's pt 

## 2019-07-27 ENCOUNTER — Other Ambulatory Visit: Payer: Self-pay

## 2019-07-29 ENCOUNTER — Other Ambulatory Visit: Payer: Self-pay

## 2019-07-29 MED ORDER — ROSUVASTATIN CALCIUM 40 MG PO TABS: ORAL_TABLET | ORAL | 1 refills | Status: DC

## 2019-07-29 MED ORDER — METOPROLOL TARTRATE 50 MG PO TABS: 50.0000 mg | ORAL_TABLET | Freq: Two times a day (BID) | ORAL | 1 refills | Status: DC

## 2019-07-29 MED ORDER — ISOSORBIDE MONONITRATE ER 30 MG PO TB24: 30.0000 mg | ORAL_TABLET | Freq: Every day | ORAL | 1 refills | Status: DC

## 2019-07-29 MED ORDER — AMLODIPINE BESYLATE 5 MG PO TABS: 5.0000 mg | ORAL_TABLET | Freq: Every day | ORAL | 1 refills | Status: DC

## 2019-08-02 ENCOUNTER — Other Ambulatory Visit: Payer: Self-pay | Admitting: *Deleted

## 2019-08-02 ENCOUNTER — Other Ambulatory Visit: Payer: Self-pay | Admitting: Cardiovascular Disease

## 2019-08-02 DIAGNOSIS — I493 Ventricular premature depolarization: Secondary | ICD-10-CM

## 2019-08-02 MED ORDER — REPATHA SURECLICK 140 MG/ML ~~LOC~~ SOAJ: SUBCUTANEOUS | 5 refills | Status: DC

## 2019-08-07 DIAGNOSIS — R81 Glycosuria: Secondary | ICD-10-CM | POA: Diagnosis not present

## 2019-08-07 DIAGNOSIS — R102 Pelvic and perineal pain: Secondary | ICD-10-CM | POA: Diagnosis not present

## 2019-08-18 ENCOUNTER — Telehealth: Payer: Self-pay | Admitting: Cardiovascular Disease

## 2019-08-18 NOTE — Telephone Encounter (Signed)
Patient states last night after putting on the heart monitor she went for a walk and got sweaty. She states it started coming off, but it came all the way off today after she took a shower. She states it is not sticky at all and will not stay on. She states she also has an allergy to latex and has some mild skin irritation. She is not sure if the monitor will work for her, because of this.

## 2019-08-19 NOTE — Telephone Encounter (Signed)
Returned call to patient. I will have a new monitor sent to her and recommend her not go for a walk/get sweaty in the 1st 24 hours.

## 2019-08-24 ENCOUNTER — Ambulatory Visit (INDEPENDENT_AMBULATORY_CARE_PROVIDER_SITE_OTHER): Payer: BC Managed Care – PPO

## 2019-08-24 DIAGNOSIS — I493 Ventricular premature depolarization: Secondary | ICD-10-CM

## 2019-08-30 DIAGNOSIS — E7801 Familial hypercholesterolemia: Secondary | ICD-10-CM | POA: Diagnosis not present

## 2019-08-30 DIAGNOSIS — Z0000 Encounter for general adult medical examination without abnormal findings: Secondary | ICD-10-CM | POA: Diagnosis not present

## 2019-08-30 DIAGNOSIS — R7301 Impaired fasting glucose: Secondary | ICD-10-CM | POA: Diagnosis not present

## 2019-08-30 DIAGNOSIS — E663 Overweight: Secondary | ICD-10-CM | POA: Diagnosis not present

## 2019-09-03 DIAGNOSIS — I209 Angina pectoris, unspecified: Secondary | ICD-10-CM | POA: Diagnosis not present

## 2019-09-03 DIAGNOSIS — Z0000 Encounter for general adult medical examination without abnormal findings: Secondary | ICD-10-CM | POA: Diagnosis not present

## 2019-09-03 DIAGNOSIS — Z79890 Hormone replacement therapy (postmenopausal): Secondary | ICD-10-CM | POA: Diagnosis not present

## 2019-09-03 DIAGNOSIS — I2510 Atherosclerotic heart disease of native coronary artery without angina pectoris: Secondary | ICD-10-CM | POA: Diagnosis not present

## 2019-09-03 DIAGNOSIS — E7801 Familial hypercholesterolemia: Secondary | ICD-10-CM | POA: Diagnosis not present

## 2019-09-09 DIAGNOSIS — Z1212 Encounter for screening for malignant neoplasm of rectum: Secondary | ICD-10-CM | POA: Diagnosis not present

## 2019-09-10 DIAGNOSIS — I493 Ventricular premature depolarization: Secondary | ICD-10-CM | POA: Diagnosis not present

## 2019-11-03 ENCOUNTER — Other Ambulatory Visit: Payer: Self-pay

## 2019-11-03 ENCOUNTER — Ambulatory Visit: Payer: BC Managed Care – PPO | Admitting: Cardiovascular Disease

## 2019-11-03 ENCOUNTER — Encounter: Payer: Self-pay | Admitting: Cardiovascular Disease

## 2019-11-03 VITALS — BP 120/83 | HR 74 | Ht 66.0 in | Wt 182.0 lb

## 2019-11-03 DIAGNOSIS — I10 Essential (primary) hypertension: Secondary | ICD-10-CM | POA: Diagnosis not present

## 2019-11-03 DIAGNOSIS — E7801 Familial hypercholesterolemia: Secondary | ICD-10-CM | POA: Diagnosis not present

## 2019-11-03 DIAGNOSIS — I493 Ventricular premature depolarization: Secondary | ICD-10-CM

## 2019-11-03 DIAGNOSIS — I25118 Atherosclerotic heart disease of native coronary artery with other forms of angina pectoris: Secondary | ICD-10-CM | POA: Diagnosis not present

## 2019-11-03 NOTE — Patient Instructions (Signed)

## 2019-11-03 NOTE — Progress Notes (Addendum)
Cardiology Office Note   This visit type was conducted due to national recommendations for restrictions regarding the COVID-19 Pandemic (e.g. social distancing) in an effort to limit this patient's exposure and mitigate transmission in our community.  Due to her co-morbid illnesses, this patient is at least at moderate risk for complications without adequate follow up.  This format is felt to be most appropriate for this patient at this time.  All issues noted in this document were discussed and addressed.  A limited physical exam was performed with this format.  Please refer to the patient's chart for her consent to telehealth for [facility].   Date:  11/03/2019   ID:  Nancy Sparks, DOB [birth year], MRN [id number]   PCP:  Haywood Pao, MD  Cardiologist:  Sanda Klein, MD  Electrophysiologist:  None   Evaluation Performed:  Follow-Up Visit  Chief Complaint:  CAD, HTN, [place]  History of Present Illness:    Nancy Sparks is a 54 y.o. female with systemic hypertension and severe hypercholesterolemia almost certainly related to heterozygous familial hypercholesterolemia who presented with unstable angina and received a drug-eluting stent to the right coronary artery in June 2019.  She also had a 75% stenosis in a small first diagonal artery left for medical therapy.  He had recurrent problems with angina after the procedure despite having no evidence of problems on repeat angiography, suggesting she may have a component of coronary vasospasm.    Normal nuclear stress test 12/08/2018.  Event monitor showed PVCs which are mildly symptomatic to him which cause broad variations in the heart rate recorded apple watch.  She has managed to lose most of the weight that she gained and is back to her weight from 3 years ago.  [place]  She was able to walk 17,000 steps a day on the average, without this causing angina.  On the other hand  she has occasional episodes of mild tightness in her upper chest and throat reminiscent of her previous angina, just less intense.  She has noticed that these are substantially more frequent if she does not drink enough water.  They have not bothered her during her recent trip.  She has not needed nitroglycerin.  The patient specifically denies any chest pain with exertion, dyspnea at rest or with exertion, orthopnea, paroxysmal nocturnal dyspnea, syncope, palpitations, focal neurological deficits, intermittent claudication, lower extremity edema, unexplained weight gain, cough, hemoptysis or wheezing.     Past Medical History:  Diagnosis Date  . Anemia    "w/pregnancies  . Anxiety    hx  . CAD (coronary artery disease)    LHC 6/19: pLAD, oD1 75, pRCA 20, mRCA 90 >> PCI: DES to RCA // Echo 6/19: EF 55-60, normal wall motion  . Complication of anesthesia    itching relieved with benadryl w/all my surgeries (07/24/2017)  . Depression    hx  . Dyslipidemia   . Fibroid   . GERD (gastroesophageal reflux disease)   . Heart murmur    hx  . Heart palpitations    hx of  . Hx of adenomatous polyp of colon 12/24/2002  . Hypertension    Past Surgical History:  Procedure Laterality Date  . BACK SURGERY    . [place]; 1998; 2001  . COLONOSCOPY    . CORONARY STENT INTERVENTION N/A 07/28/2017   Procedure: CORONARY STENT INTERVENTION;  Surgeon: Troy Sine, MD;  Location:  [facility];  Service: Cardiovascular;  Laterality: N/A;  RCA  . LEFT HEART CATH AND CORONARY ANGIOGRAPHY N/A 07/28/2017   Procedure: LEFT HEART CATH AND CORONARY ANGIOGRAPHY;  Surgeon: Troy Sine, MD;  Location: [facility];  Service: Cardiovascular;  Laterality: N/A;  . LEFT HEART CATH AND CORONARY ANGIOGRAPHY N/A 09/04/2017   Procedure: LEFT HEART CATH AND CORONARY ANGIOGRAPHY;  Surgeon: Sherren Mocha, MD;  Location: [facility];  Service: Cardiovascular;  Laterality: N/A;  . LUMBAR  LAMINECTOMY/DECOMPRESSION MICRODISCECTOMY  04/25/2011   Procedure: LUMBAR LAMINECTOMY/DECOMPRESSION MICRODISCECTOMY 1 LEVEL;  Surgeon: Elaina Hoops, MD;  Location: [facility];  Service: Neurosurgery;  Laterality: Left;  Left Lumbar Three-Four Laminectomy and Diskectomy  . LUMBAR LAMINECTOMY/DECOMPRESSION MICRODISCECTOMY  04/26/2011   Procedure: LUMBAR LAMINECTOMY/DECOMPRESSION MICRODISCECTOMY 1 LEVEL;  Surgeon: Elaina Hoops, MD;  Location: [place] [facility];  Service: Neurosurgery;  Laterality: N/A;  Re-Exploration of Lumbar Wound and Evacuation of Epidural Hematoma  . [place]  2006  . TUBAL LIGATION  2001     Current Meds  Medication Sig  . ALPRAZolam (XANAX) 0.25 MG tablet Take 1 tablet (0.25 mg total) by mouth daily as needed for anxiety.  Marland Kitchen amLODipine (NORVASC) 5 MG tablet Take 1 tablet (5 mg total) by mouth daily.  Marland Kitchen aspirin 81 MG EC tablet Take 1 tablet (81 mg total) by mouth daily.  . Evolocumab (REPATHA SURECLICK) 595 MG/ML SOAJ INJECT 1 PEN INTO THE SKIN EVERY 14 DAYS AS DIRECTED  . isosorbide mononitrate (IMDUR) 30 MG 24 hr tablet Take 1 tablet (30 mg total) by mouth daily.  . metoprolol tartrate (LOPRESSOR) 50 MG tablet Take 1 tablet (50 mg total) by mouth 2 (two) times daily.  . nitroGLYCERIN (NITROSTAT) 0.4 MG SL tablet Place 1 tablet (0.4 mg total) under the tongue every 5 (five) minutes as needed for chest pain.  Marland Kitchen REPATHA SURECLICK 638 MG/ML SOAJ INJECT 1 PEN INTO THE SKIN EVERY 14 DAYS AS DIRECTED  . rosuvastatin (CRESTOR) 40 MG tablet TAKE 1 TABLET(40 MG) BY MOUTH AT BEDTIME  . STOOL SOFTENER 100 MG capsule Take 1 capsule by mouth as needed.   . Tetrahydrozoline HCl (VISINE OP) Place 1 drop into both eyes daily as needed (dry eyes).  . trazodone (DESYREL) 300 MG tablet Take at bedtime as needed (Patient taking differently: 150 mg. Take at bedtime as needed)  . [DISCONTINUED] Estradiol 10 MCG TABS vaginal tablet Daily for 2 weeks and then 2 times per week per vagina      Allergies:   Latex and Tape   Social History   Tobacco Use  . Smoking status: Former Smoker    Packs/day: 0.10    Years: 2.00    Pack years: 0.20    Types: Cigarettes  . Smokeless tobacco: Never Used  . Tobacco comment: quit smoking in college  Vaping Use  . Vaping Use: Never used  Substance Use Topics  . Alcohol use: Yes    Alcohol/week: 6.0 standard drinks    Types: 6 Glasses of wine per week  . Drug use: Not Currently    Types: Marijuana    Comment: 07/24/2017 "in college"     Family Hx: The patient's family history includes Anesthesia problems in her mother and sister; Atrial fibrillation in her father; Cancer in her sister; Colon cancer (age of onset: 46) in her sister; Coronary artery disease in her father; Diabetes in her father; Heart disease in her father; Hyperlipidemia in her brother,  father, mother, and sister; Hypertension in her father and mother; Thyroid disease in her sister.  ROS:   Please see the history of present illness.    All other systems reviewed and are negative.   Prior CV studies:   The following studies were reviewed today: Lexiscan Myoview 12/08/2018  Nuclear stress EF: 64%. The left ventricular ejection fraction is normal (55-65%).  There was no ST segment deviation noted during stress.  This is a low risk study. There is no evidence of ischemia  The study is normal.  Event monitor 08/24/2019  Dominant rhythm is normal sinus with normal circadian variation.  There are relatively frequent premature ventricular contractions representing 2% of all QRS complexes. The vast majority of these have the same morphology. These sometimes occur in a pattern of trigeminy. Ventricular couplets are rarely seen.  There is no evidence of ventricular tachycardia, atrial fibrillation or meaningful bradycardia or pauses.  The patient symptoms appear to correlate with periods of increased frequency of premature ventricular beats.   Abnormal event  monitor due to symptomatic monomorphic PVCs. This explains the apparent variations in pulse rate (as PVCs may not be detected by the visual receptor of the Apple watch).    Labs/Other Tests and Data Reviewed:    EKG: Ordered today, shows normal sinus rhythm with early repolarization, no ischemic changes, no change from previous tracings  Recent Labs: 01/05/2019 TSH 3.29 08/18/2018 creatinine 0.7, hemoglobin 12.6, hemoglobin A1c 5.2%, normal LFTs  08/30/2019 Creatinine 0.8, hemoglobin A1c 5.6%, hemoglobin 13.3, normal liver functions  Recent Lipid Panel Lab Results  Component Value Date/Time   CHOL 263 (H) 07/25/2017 10:51 AM   TRIG 58 07/25/2017 10:51 AM   HDL 78 07/25/2017 10:51 AM   CHOLHDL 3.4 07/25/2017 10:51 AM   LDLCALC 173 (H) 07/25/2017 10:51 AM   LDLDIRECT 115 (H) 09/03/2017 12:09 PM   LDLDIRECT 155.5 05/14/2011 09:24 AM    08/30/2019 Total cholesterol 149, HDL 63, LDL 49, triglycerides 186  Wt Readings from Last 3 Encounters:  11/03/19 182 lb (82.6 kg)  05/31/19 187 lb (84.8 kg)  01/20/19 188 lb (85.3 kg)     Objective:    Vital Signs:  BP 120/83   Pulse 74   Ht 5\' 6"  (1.676 m)   Wt 182 lb (82.6 kg)   SpO2 98%   BMI 29.38 kg/m     General: Alert, oriented x3, no distress, overweight Head: no evidence of trauma, PERRL, EOMI, no exophtalmos or lid lag, no myxedema, no xanthelasma; normal ears, nose and oropharynx Neck: normal jugular venous pulsations and no hepatojugular reflux; brisk carotid pulses without delay and no carotid bruits Chest: clear to auscultation, no signs of consolidation by percussion or palpation, normal fremitus, symmetrical and full respiratory excursions Cardiovascular: normal position and quality of the apical impulse, regular rhythm, normal first and second heart sounds, no murmurs, rubs or gallops Abdomen: no tenderness or distention, no masses by palpation, no abnormal pulsatility or arterial bruits, normal bowel sounds, no  hepatosplenomegaly Extremities: no clubbing, cyanosis or edema; 2+ radial, ulnar and brachial pulses bilaterally; 2+ right femoral, posterior tibial and dorsalis pedis pulses; 2+ left femoral, posterior tibial and dorsalis pedis pulses; no subclavian or femoral bruits Neurological: grossly nonfocal Psych: Normal mood and affect   ASSESSMENT & PLAN:    1. Essential hypertension   2. Coronary artery disease of native artery of native heart with stable angina pectoris ([place])   3. Heterozygous familial hypercholesterolemia   4. PVC's (premature ventricular  contractions)      1. CAD: She is almost asymptomatic.  She has occasional episodes that might represent angina pectoris occurring no more than once or twice a month.  She was able to perform prolonged and significant physical exercise last month without this provoking angina.  She is on 3 antianginal medications (amlodipine, isosorbide nitrate, metoprolol).  She is on aspirin and effective lipid-lowering medications. 2. HFH: Exceptional response to combination therapy with statin plus Repatha.  LDL cholesterol at 49, minimal residual hypertriglyceridemia. 3. HTN: Good blood pressure control on the current regimen 4. PVCs: These are more of a nuisance than a real problem.  She did not tolerate higher doses of beta-blocker due to fatigue.  Patient Instructions  Medication Instructions:  No changes *If you need a refill on your cardiac medications before your next appointment, please call your pharmacy*   Lab Work: None ordered If you have labs (blood work) drawn today and your tests are completely normal, you will receive your results only by: Marland Kitchen MyChart Message (if you have MyChart) OR . A paper copy in the mail If you have any lab test that is abnormal or we need to change your treatment, we will call you to review the results.   Testing/Procedures: None ordered   Follow-Up: At [facility], you and your health needs are our  priority.  As part of our continuing mission to provide you with exceptional heart care, we have created designated Provider Care Teams.  These Care Teams include your primary Cardiologist (physician) and Advanced Practice Providers (APPs -  Physician Assistants and Nurse Practitioners) who all work together to provide you with the care you need, when you need it.  We recommend signing up for the patient portal called "MyChart".  Sign up information is provided on this After Visit Summary.  MyChart is used to connect with patients for Virtual Visits (Telemedicine).  Patients are able to view lab/test results, encounter notes, upcoming appointments, etc.  Non-urgent messages can be sent to your provider as well.   To learn more about what you can do with MyChart, go to [email or web address].    Your next appointment:   12 month(s)  The format for your next appointment:   In Person  Provider:   You may see Sanda Klein, MD or one of the following Advanced Practice Providers on your designated Care Team:    Almyra Deforest, PA-C  Fabian Sharp, [place] or   Roby Lofts, PA-C      Signed, Sanda Klein, MD  11/03/2019 9:46 AM    [place]

## 2020-01-03 DIAGNOSIS — J988 Other specified respiratory disorders: Secondary | ICD-10-CM | POA: Diagnosis not present

## 2020-01-03 DIAGNOSIS — R059 Cough, unspecified: Secondary | ICD-10-CM | POA: Diagnosis not present

## 2020-01-03 DIAGNOSIS — R509 Fever, unspecified: Secondary | ICD-10-CM | POA: Diagnosis not present

## 2020-01-19 ENCOUNTER — Telehealth: Payer: Self-pay | Admitting: Cardiovascular Disease

## 2020-01-19 NOTE — Telephone Encounter (Signed)
*  STAT* If patient is at the pharmacy, call can be transferred to refill team.   1. Which medications need to be refilled? (please list name of each medication and dose if known)  isosorbide mononitrate (IMDUR) 30 MG 24 hr tablet  2. Which pharmacy/location (including street and city if local pharmacy) is medication to be sent to? [facility] [phone] - [place], [place] - [address]  3. Do they need a 30 day or 90 day supply?  90 days

## 2020-01-20 MED ORDER — ISOSORBIDE MONONITRATE ER 30 MG PO TB24: 30.0000 mg | ORAL_TABLET | Freq: Every day | ORAL | 1 refills | Status: DC

## 2020-01-22 DIAGNOSIS — Z20822 Contact with and (suspected) exposure to covid-19: Secondary | ICD-10-CM | POA: Diagnosis not present

## 2020-02-01 ENCOUNTER — Other Ambulatory Visit: Payer: Self-pay | Admitting: Cardiovascular Disease

## 2020-02-07 ENCOUNTER — Other Ambulatory Visit: Payer: Self-pay

## 2020-02-07 MED ORDER — ROSUVASTATIN CALCIUM 40 MG PO TABS: ORAL_TABLET | ORAL | 3 refills | Status: DC

## 2020-02-07 NOTE — Progress Notes (Signed)
Rosuvastatin refill authorization request received from [facility]

## 2020-02-24 ENCOUNTER — Other Ambulatory Visit: Payer: Self-pay

## 2020-02-24 MED ORDER — AMLODIPINE BESYLATE 5 MG PO TABS: 5.0000 mg | ORAL_TABLET | Freq: Every day | ORAL | 3 refills | Status: DC

## 2020-03-21 ENCOUNTER — Other Ambulatory Visit: Payer: Self-pay

## 2020-03-21 MED ORDER — REPATHA SURECLICK 140 MG/ML ~~LOC~~ SOAJ: SUBCUTANEOUS | 11 refills | Status: DC

## 2020-03-23 MED ORDER — METOPROLOL TARTRATE 50 MG PO TABS: 50.0000 mg | ORAL_TABLET | Freq: Two times a day (BID) | ORAL | 3 refills | Status: DC

## 2020-04-14 ENCOUNTER — Other Ambulatory Visit: Payer: Self-pay

## 2020-04-14 MED ORDER — ASPIRIN 81 MG PO TBEC: 81.0000 mg | DELAYED_RELEASE_TABLET | Freq: Every day | ORAL | 2 refills | Status: DC

## 2020-04-18 DIAGNOSIS — I209 Angina pectoris, unspecified: Secondary | ICD-10-CM | POA: Diagnosis not present

## 2020-04-18 DIAGNOSIS — Z1339 Encounter for screening examination for other mental health and behavioral disorders: Secondary | ICD-10-CM | POA: Diagnosis not present

## 2020-04-18 DIAGNOSIS — I2510 Atherosclerotic heart disease of native coronary artery without angina pectoris: Secondary | ICD-10-CM | POA: Diagnosis not present

## 2020-04-18 DIAGNOSIS — Z1331 Encounter for screening for depression: Secondary | ICD-10-CM | POA: Diagnosis not present

## 2020-04-18 DIAGNOSIS — R7301 Impaired fasting glucose: Secondary | ICD-10-CM | POA: Diagnosis not present

## 2020-05-05 ENCOUNTER — Ambulatory Visit
Admission: RE | Admit: 2020-05-05 | Discharge: 2020-05-05 | Disposition: A | Discharge status: Home / Self Care | Payer: BC Managed Care – PPO | Source: Ambulatory Visit | Attending: Internal Medicine | Admitting: Internal Medicine

## 2020-05-05 ENCOUNTER — Telehealth: Payer: Self-pay | Admitting: Internal Medicine

## 2020-05-05 ENCOUNTER — Other Ambulatory Visit (INDEPENDENT_AMBULATORY_CARE_PROVIDER_SITE_OTHER): Payer: BC Managed Care – PPO

## 2020-05-05 ENCOUNTER — Other Ambulatory Visit: Payer: Self-pay

## 2020-05-05 DIAGNOSIS — R1084 Generalized abdominal pain: Secondary | ICD-10-CM

## 2020-05-05 DIAGNOSIS — R112 Nausea with vomiting, unspecified: Secondary | ICD-10-CM | POA: Diagnosis not present

## 2020-05-05 DIAGNOSIS — R509 Fever, unspecified: Secondary | ICD-10-CM

## 2020-05-05 DIAGNOSIS — K5732 Diverticulitis of large intestine without perforation or abscess without bleeding: Secondary | ICD-10-CM | POA: Diagnosis not present

## 2020-05-05 DIAGNOSIS — R197 Diarrhea, unspecified: Secondary | ICD-10-CM | POA: Diagnosis not present

## 2020-05-05 LAB — COMPREHENSIVE METABOLIC PANEL
ALT: 36 U/L — ABNORMAL HIGH (ref 0–35)
AST: 27 U/L (ref 0–37)
Albumin: 4.4 g/dL (ref 3.5–5.2)
Alkaline Phosphatase: 92 U/L (ref 39–117)
BUN: 16 mg/dL (ref 6–23)
CO2: 25 mEq/L (ref 19–32)
Calcium: 9.4 mg/dL (ref 8.4–10.5)
Chloride: 103 mEq/L (ref 96–112)
Creatinine, Ser: 0.9 mg/dL (ref 0.40–1.20)
GFR: 72.05 mL/min (ref >60.00)
Glucose, Bld: 143 mg/dL — ABNORMAL HIGH (ref 70–99)
Potassium: 3.5 mEq/L (ref 3.5–5.1)
Sodium: 139 mEq/L (ref 135–145)
Total Bilirubin: 0.5 mg/dL (ref 0.2–1.2)
Total Protein: 7.8 g/dL (ref 6.0–8.3)

## 2020-05-05 LAB — CBC
HCT: 40.3 % (ref 36.0–46.0)
Hemoglobin: 13.7 g/dL (ref 12.0–15.0)
MCHC: 34 g/dL (ref 30.0–36.0)
MCV: 89 fl (ref 78.0–100.0)
Platelets: 169 K/uL (ref 150.0–400.0)
RBC: 4.53 Mil/uL (ref 3.87–5.11)
RDW: 14.6 % (ref 11.5–15.5)
WBC: 7.9 K/uL (ref 4.0–10.5)

## 2020-05-05 LAB — LIPASE: Lipase: 23 U/L (ref 11.0–59.0)

## 2020-05-05 MED ORDER — IOPAMIDOL (ISOVUE-300) INJECTION 61%
100.0000 mL | Freq: Once | INTRAVENOUS | Status: AC | PRN
  Administered 2020-05-05: 100 mL via INTRAVENOUS

## 2020-05-05 MED ORDER — FLUCONAZOLE 150 MG PO TABS
150.0000 mg | ORAL_TABLET | Freq: Once | ORAL | 0 refills | Status: AC
Start: 2020-05-05 — End: 2020-05-05

## 2020-05-05 MED ORDER — AMOXICILLIN-POT CLAVULANATE 875-125 MG PO TABS: 1.0000 | ORAL_TABLET | Freq: Two times a day (BID) | ORAL | 0 refills | Status: AC

## 2020-05-05 NOTE — Telephone Encounter (Signed)
Patient with history of diverticulitis.  Had a gastrointestinal "bug" earlier in the week and most of that has resolved but she is having lower abdominal pain and tenderness consistent with previous diverticulitis and a temperature.  She was 101.2 yesterday and 100.2 today.  No more diarrhea.   I have ordered labs for her to get today and she needs a CT abdomen and pelvis with contrast later today or tomorrow.   Reasons are lower abdominal pain and tenderness and history of diverticulitis

## 2020-05-05 NOTE — Telephone Encounter (Signed)
Patient has been instructed to go to [place] location of [facility].  She verbalized understanding that it is a walk in appointment with she will have to wait and drink her contrast over a 2 hour period,

## 2020-05-05 NOTE — Telephone Encounter (Signed)
Patient notified that was are trying to obtain pre-certification for the CT today.  She is advised to not have any solid foods and that I will call her once they can obtain auth.

## 2020-05-05 NOTE — Telephone Encounter (Signed)
CT shows mild sigmoid diverticulitis changes I will Rx Abx but plan abd exam tomorrow as well Discussed w/ patient

## 2020-05-08 NOTE — Telephone Encounter (Signed)
In person evaluation on 05/06/2020 showed focal lower left quadrant tenderness.  I think she has had mild diverticulitis and she is to take Augmentin generic 875 mg twice daily for 7 days and hold the other 7 tablets in reserve.

## 2020-06-07 ENCOUNTER — Other Ambulatory Visit: Payer: Self-pay

## 2020-06-07 ENCOUNTER — Ambulatory Visit (INDEPENDENT_AMBULATORY_CARE_PROVIDER_SITE_OTHER): Payer: BC Managed Care – PPO | Admitting: Obstetrics & Gynecology

## 2020-06-07 ENCOUNTER — Encounter: Payer: Self-pay | Admitting: Obstetrics & Gynecology

## 2020-06-07 ENCOUNTER — Other Ambulatory Visit (HOSPITAL_COMMUNITY)
Admission: RE | Admit: 2020-06-07 | Discharge: 2020-06-07 | Disposition: A | Discharge status: Home / Self Care | Payer: BC Managed Care – PPO | Source: Ambulatory Visit | Attending: Obstetrics & Gynecology | Admitting: Obstetrics & Gynecology

## 2020-06-07 VITALS — BP 132/84 | Ht 65.0 in | Wt 184.0 lb

## 2020-06-07 DIAGNOSIS — G4700 Insomnia, unspecified: Secondary | ICD-10-CM

## 2020-06-07 DIAGNOSIS — Z780 Asymptomatic menopausal state: Secondary | ICD-10-CM

## 2020-06-07 DIAGNOSIS — Z01419 Encounter for gynecological examination (general) (routine) without abnormal findings: Secondary | ICD-10-CM | POA: Diagnosis not present

## 2020-06-07 DIAGNOSIS — Z6830 Body mass index (BMI) 30.0-30.9, adult: Secondary | ICD-10-CM

## 2020-06-07 DIAGNOSIS — N951 Menopausal and female climacteric states: Secondary | ICD-10-CM

## 2020-06-07 DIAGNOSIS — E6609 Other obesity due to excess calories: Secondary | ICD-10-CM

## 2020-06-07 MED ORDER — TRAZODONE HCL 300 MG PO TABS: 150.0000 mg | ORAL_TABLET | Freq: Every day | ORAL | 4 refills | Status: DC

## 2020-06-07 NOTE — Progress Notes (Signed)
Nancy Sparks January 30, 1966 [id number]   History:    55 y.o. [id number] Married.  2 daughters, 1 son in their 14's  RP:  Established patient presenting for annual gyn exam   HPI:  Postmenopausal on no HRT with no bleeding.  Normal Pap and mammogram history.  2017 2 benign colon polyps 5-year follow-up, sister  colon cancer survivor.  BMI 30.62.  Physically active.  Healthy nutrition.  Primary care manages hypertension, hypercholesteremia, and GERD.  2019 cardiac cath had a stent placed/CAD.   Past medical history,surgical history, family history and social history were all reviewed and documented in the EPIC chart.  Gynecologic History No LMP recorded. Patient has had an ablation.  Obstetric History OB History  Gravida Para Term Preterm AB Living  4 3 3   1 3   SAB IAB Ectopic Multiple Live Births               # Outcome Date GA Lbr Len/2nd Weight Sex Delivery Anes PTL Lv  4 AB           3 Term           2 Term           1 Term              ROS: A ROS was performed and pertinent positives and negatives are included in the history.  GENERAL: No fevers or chills. HEENT: No change in vision, no earache, sore throat or sinus congestion. NECK: No pain or stiffness. CARDIOVASCULAR: No chest pain or pressure. No palpitations. PULMONARY: No shortness of breath, cough or wheeze. GASTROINTESTINAL: No abdominal pain, nausea, vomiting or diarrhea, melena or bright red blood per rectum. GENITOURINARY: No urinary frequency, urgency, hesitancy or dysuria. MUSCULOSKELETAL: No joint or muscle pain, no back pain, no recent trauma. DERMATOLOGIC: No rash, no itching, no lesions. ENDOCRINE: No polyuria, polydipsia, no heat or cold intolerance. No recent change in weight. HEMATOLOGICAL: No anemia or easy bruising or bleeding. NEUROLOGIC: No headache, seizures, numbness, tingling or weakness. PSYCHIATRIC: No depression, no loss of interest in normal activity or change in sleep pattern.     Exam:   BP  132/84   Ht 5\' 5"  (1.651 m)   Wt 184 lb (83.5 kg)   BMI 30.62 kg/m   Body mass index is 30.62 kg/m.  General appearance : Well developed well nourished female. No acute distress HEENT: Eyes: no retinal hemorrhage or exudates,  Neck supple, trachea midline, no carotid bruits, no thyroidmegaly Lungs: Clear to auscultation, no rhonchi or wheezes, or rib retractions  Heart: Regular rate and rhythm, no murmurs or gallops Breast:Examined in sitting and supine position were symmetrical in appearance, no palpable masses or tenderness,  no skin retraction, no nipple inversion, no nipple discharge, no skin discoloration, no axillary or supraclavicular lymphadenopathy Abdomen: no palpable masses or tenderness, no rebound or guarding Extremities: no edema or skin discoloration or tenderness  Pelvic: Vulva: Normal             Vagina: No gross lesions or discharge  Cervix: No gross lesions or discharge.  Pap reflex done.  Uterus  AV, normal size, shape and consistency, non-tender and mobile  Adnexa  Without masses or tenderness  Anus: Normal   Assessment/Plan:  56 y.o. female for annual exam   1. Encounter for routine gynecological examination with Papanicolaou smear of cervix Normal gynecologic exam in menopause.  Pap reflex done.  Breast exam normal.  Screening mammogram was  negative in October 2021.  Colonoscopy in 2017.  Health labs with family physician. - Cytology - PAP( [place])  2. Postmenopause Well on no hormone replacement therapy.  No postmenopausal bleeding.  Vitamin D supplements, calcium intake of 1.5 g/day total and regular weightbearing physical activity is recommended.  3. Menopausal vaginal dryness Prefers not to use estradiol based treatment.  Recommend coconut oil as needed.  4. Insomnia, unspecified type Well on Trazodone.  Counseling on other methods to improve sleep including physical activity/Yoga/Meditation/Camomilla herb tea/  5. Class 1 obesity due to excess  calories with serious comorbidity and body mass index (BMI) of 30.0 to 30.9 in adult Recommend a lower calorie/carb diet.  Aerobic activities 5 times a week and light weightlifting every 2 days.  Other orders - trazodone (DESYREL) 300 MG tablet; Take 0.5 tablets (150 mg total) by mouth at bedtime. Take at bedtime as needed  Princess Bruins MD, 3:23 PM 06/07/2020

## 2020-06-08 LAB — CYTOLOGY - PAP: Diagnosis: NEGATIVE

## 2020-06-10 ENCOUNTER — Encounter: Payer: Self-pay | Admitting: Obstetrics & Gynecology

## 2020-06-15 ENCOUNTER — Telehealth: Payer: Self-pay

## 2020-06-15 NOTE — Telephone Encounter (Signed)
At visit last week Dr. Marguerita Merles was to send Rx for Trazadone. Patient called pharmacy and it was not there. Upon review Rx actually went to print and did not reach the pharmacy.  I called the Rx in to her pharmacy.

## 2020-07-19 ENCOUNTER — Other Ambulatory Visit: Payer: Self-pay

## 2020-07-19 MED ORDER — ISOSORBIDE MONONITRATE ER 30 MG PO TB24: 30.0000 mg | ORAL_TABLET | Freq: Every day | ORAL | 1 refills | Status: DC

## 2020-08-15 ENCOUNTER — Telehealth: Payer: Self-pay | Admitting: Cardiovascular Disease

## 2020-08-15 NOTE — Telephone Encounter (Signed)
Spoke with patient of Dr. Loletha Grayer who has concerns of chest tightness, tachycardia, shortness of breath  She had a stent in 09/2017 - reports similar symptoms now to what she had then, except no jaw pain, which she states was a trigger and if occurred today would've made her go to hospital  She played pickle ball outside today -- the first time since about April/May. She reports her HR was 147bpm after about 10-57mins of activity and stayed 140-160bpm during her activty. She reports chest tightness during activity and also shortness of breath ("huffing and puffing"). She did not have NTG with her today and at present time, does not feel like she needs it but said she would've taken it then if she had on hand.   She had an episode of chest tightness while in [place] on vacation during a very hot day.   Her current HR is 110s, she reports chest tightness has improved, and breathing is better  She reports she did not hydrate well yesterday  Advised will send message to MD to review, but an ED eval of symptoms may be warranted. Will see if a DOD spot tomorrow can be utilized

## 2020-08-15 NOTE — Telephone Encounter (Signed)
STAT if HR is under 50 or over 120 (normal HR is 60-100 beats per minute)  What is your heart rate? 150 stayed like that for an hour- she was playing pickle ball, now it is 127  Do you have a log of your heart rate readings (document readings)?    Do you have any other symptoms?  Tightness in chest , and dizzy

## 2020-08-15 NOTE — Telephone Encounter (Signed)
Spoke with patient to scheduled for acute visit on 08/16/20 @ 2:30pm with Dr. Sallyanne Kuster  She reports she came home, took a lukewarm shower and HR is still 130bpm Advised if symptoms have not improved in an hour or so, or she has worsening shortness of breath/chest tightness, she should seek evaluation at [facility] or hospital

## 2020-08-16 ENCOUNTER — Ambulatory Visit: Payer: BC Managed Care – PPO | Admitting: Cardiovascular Disease

## 2020-08-16 ENCOUNTER — Other Ambulatory Visit: Payer: Self-pay

## 2020-08-16 ENCOUNTER — Encounter: Payer: Self-pay | Admitting: Cardiovascular Disease

## 2020-08-16 VITALS — BP 122/86 | HR 81 | Ht 66.0 in | Wt 185.6 lb

## 2020-08-16 DIAGNOSIS — I25118 Atherosclerotic heart disease of native coronary artery with other forms of angina pectoris: Secondary | ICD-10-CM

## 2020-08-16 DIAGNOSIS — E7801 Familial hypercholesterolemia: Secondary | ICD-10-CM | POA: Diagnosis not present

## 2020-08-16 DIAGNOSIS — I493 Ventricular premature depolarization: Secondary | ICD-10-CM | POA: Diagnosis not present

## 2020-08-16 DIAGNOSIS — I10 Essential (primary) hypertension: Secondary | ICD-10-CM

## 2020-08-16 DIAGNOSIS — R000 Tachycardia, unspecified: Secondary | ICD-10-CM

## 2020-08-16 NOTE — Progress Notes (Signed)
Cardiology Office Note   Date:  08/18/2020   ID:  Nancy Sparks, DOB 1965/11/08, MRN [id number]   PCP:  Haywood Pao, MD  Cardiologist:  Sanda Klein, MD  Electrophysiologist:  None   Evaluation Performed:  Follow-Up Visit  Chief Complaint:  CAD, HTN, [place]  History of Present Illness:    Nancy Sparks is a 55 y.o. female with systemic hypertension and severe hypercholesterolemia almost certainly related to heterozygous familial hypercholesterolemia who presented with unstable angina and received a drug-eluting stent to the right coronary artery in June 2019.  She also had a 75% stenosis in a small first diagonal artery left for medical therapy.  He had recurrent problems with angina after the procedure despite having no evidence of problems on repeat angiography, suggesting she may have a component of coronary vasospasm.    Normal nuclear stress test 12/08/2018.  Event monitor showed PVCs which are mildly symptomatic to him which cause broad variations in the heart rate recorded apple watch.  She is generally done well over the last year, but recently has noticed that she is poorly tolerant of exercise in hot weather.  She was on a trip to [place] with her family and walking uphill in the hot weather that to some chest tightness which made her stop to rest.  She will promptly have relief of her symptoms with rest.  Yesterday she was playing pickle ball when she developed unexpectedly fast heart rate and chest tightness.  The heart rate increased gradually peaked around 160 bpm and resolved very slowly and very gradually throughout the rest of the afternoon.  She has a smart watch that reported her heart rate, but she forgot that she could actually record a rhythm strip.  The chest tightness was mild but persistent, but it never associated jaw pain that she associates with the angina.  Yesterday was a very hot and humid day.  This morning when she woke up she felt fine.  ECG today  shows normal sinus rhythm without any repolarization abnormalities.   Past Medical History:  Diagnosis Date   Anemia    "w/pregnancies   Anxiety    hx   CAD (coronary artery disease)    LHC 6/19: pLAD, oD1 75, pRCA 20, mRCA 90 >> PCI: DES to RCA // Echo 6/19: EF 55-60, normal wall motion   Complication of anesthesia    itching relieved with benadryl w/all my surgeries (07/24/2017)   Depression    hx   Dyslipidemia    Fibroid    GERD (gastroesophageal reflux disease)    Heart murmur    hx   Heart palpitations    hx of   Hx of adenomatous polyp of colon 12/24/2002   Hypertension    Past Surgical History:  Procedure Laterality Date   [place]; 1998; 2001   COLONOSCOPY     CORONARY STENT INTERVENTION N/A 07/28/2017   Procedure: CORONARY STENT INTERVENTION;  Surgeon: Troy Sine, MD;  Location: [facility];  Service: Cardiovascular;  Laterality: N/A;  RCA   LEFT HEART CATH AND CORONARY ANGIOGRAPHY N/A 07/28/2017   Procedure: LEFT HEART CATH AND CORONARY ANGIOGRAPHY;  Surgeon: Troy Sine, MD;  Location: [facility];  Service: Cardiovascular;  Laterality: N/A;   LEFT HEART CATH AND CORONARY ANGIOGRAPHY N/A 09/04/2017   Procedure: LEFT HEART CATH AND CORONARY ANGIOGRAPHY;  Surgeon: Sherren Mocha, MD;  Location: [facility];  Service: Cardiovascular;  Laterality: N/A;   LUMBAR LAMINECTOMY/DECOMPRESSION MICRODISCECTOMY  04/25/2011   Procedure: LUMBAR LAMINECTOMY/DECOMPRESSION MICRODISCECTOMY 1 LEVEL;  Surgeon: Elaina Hoops, MD;  Location: [facility];  Service: Neurosurgery;  Laterality: Left;  Left Lumbar Three-Four Laminectomy and Diskectomy   LUMBAR LAMINECTOMY/DECOMPRESSION MICRODISCECTOMY  04/26/2011   Procedure: LUMBAR LAMINECTOMY/DECOMPRESSION MICRODISCECTOMY 1 LEVEL;  Surgeon: Elaina Hoops, MD;  Location: [facility];  Service: Neurosurgery;  Laterality: N/A;  Re-Exploration of Lumbar Wound and Evacuation of Epidural  Hematoma   NOVASURE ABLATION  2006   TUBAL LIGATION  2001     Current Meds  Medication Sig   amLODipine (NORVASC) 5 MG tablet Take 1 tablet (5 mg total) by mouth daily.   aspirin 81 MG EC tablet Take 1 tablet (81 mg total) by mouth daily.   Evolocumab (REPATHA SURECLICK) 696 MG/ML SOAJ Inject 1 pen every 14 days subutaneously   isosorbide mononitrate (IMDUR) 30 MG 24 hr tablet Take 1 tablet (30 mg total) by mouth daily. NEEDS APPOINTMENT FOR FUTURE REFILLS   metoprolol tartrate (LOPRESSOR) 50 MG tablet Take 1 tablet (50 mg total) by mouth 2 (two) times daily.   rosuvastatin (CRESTOR) 40 MG tablet TAKE 1 TABLET(40 MG) BY MOUTH AT BEDTIME   STOOL SOFTENER 100 MG capsule Take 1 capsule by mouth as needed.    Tetrahydrozoline HCl (VISINE OP) Place 1 drop into both eyes daily as needed (dry eyes).   trazodone (DESYREL) 300 MG tablet Take 0.5 tablets (150 mg total) by mouth at bedtime. Take at bedtime as needed     Allergies:   Latex and Tape   Social History   Tobacco Use   Smoking status: Former    Packs/day: 0.10    Years: 2.00    Pack years: 0.20    Types: Cigarettes   Smokeless tobacco: Never   Tobacco comments:    quit smoking in college  Vaping Use   Vaping Use: Never used  Substance Use Topics   Alcohol use: Yes    Alcohol/week: 6.0 standard drinks    Types: 6 Glasses of wine per week   Drug use: Not Currently    Types: Marijuana    Comment: 07/24/2017 "in college"     Family Hx: The patient's family history includes Anesthesia problems in her mother and sister; Atrial fibrillation in her father; Cancer in her sister; Colon cancer (age of onset: 75) in her sister; Coronary artery disease in her father; Diabetes in her father; Heart disease in her father; Hyperlipidemia in her brother, father, mother, and sister; Hypertension in her father and mother; Thyroid disease in her sister.  ROS:   Please see the history of present illness.    All other systems reviewed and are  negative.   Prior CV studies:   The following studies were reviewed today: Lexiscan Myoview 12/08/2018 Nuclear stress EF: 64%. The left ventricular ejection fraction is normal (55-65%). There was no ST segment deviation noted during stress. This is a low risk study. There is no evidence of ischemia The study is normal.  Event monitor 08/24/2019 Dominant rhythm is normal sinus with normal circadian variation. There are relatively frequent premature ventricular contractions representing 2% of all QRS complexes. The vast majority of these have the same morphology. These sometimes occur in a pattern of trigeminy. Ventricular couplets are rarely seen. There is no evidence of ventricular tachycardia, atrial fibrillation or meaningful bradycardia or pauses. The patient symptoms appear to correlate with periods of increased frequency of  premature ventricular beats.   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 This explains the apparent variations in pulse rate (as PVCs may not be detected by the visual receptor of the Apple watch).    Labs/Other Tests and Data Reviewed:    EKG: Ordered today and personally reviewed shows sinus rhythm, questionable left atrial abnormality, no repolarization changes, similar to previous tracings Recent Labs: 01/05/2019 TSH 3.29 08/18/2018 creatinine 0.7, hemoglobin 12.6, hemoglobin A1c 5.2%, normal LFTs  08/30/2019 Creatinine 0.8, hemoglobin A1c 5.6%, hemoglobin 13.3, normal liver functions  05/05/2020 Hemoglobin 13.7, creatinine 0.9, potassium 3.5, ALT 36   Recent Lipid Panel Lab Results  Component Value Date/Time   CHOL 263 (H) 07/25/2017 10:51 AM   TRIG 58 07/25/2017 10:51 AM   HDL 78 07/25/2017 10:51 AM   CHOLHDL 3.4 07/25/2017 10:51 AM   LDLCALC 173 (H) 07/25/2017 10:51 AM   LDLDIRECT 115 (H) 09/03/2017 12:09 PM   LDLDIRECT 155.5 05/14/2011 09:24 AM    08/30/2019 Total cholesterol 149, HDL 63, LDL 49, triglycerides  186  04/18/2020 Cholesterol 165 HDL 72, LDL 58, triglycerides 179  Wt Readings from Last 3 Encounters:  08/16/20 185 lb 9.6 oz (84.2 kg)  06/07/20 184 lb (83.5 kg)  11/03/19 182 lb (82.6 kg)     Objective:    Vital Signs:  BP 122/86 (BP Location: Left Arm, Patient Position: Sitting, Cuff Size: Normal)   Pulse 81   Ht 5\' 6"  (1.676 m)   Wt 185 lb 9.6 oz (84.2 kg)   SpO2 97%   BMI 29.96 kg/m     General: Alert, oriented x3, no distress, mildly overweight Head: no evidence of trauma, PERRL, EOMI, no exophtalmos or lid lag, no myxedema, no xanthelasma; normal ears, nose and oropharynx Neck: normal jugular venous pulsations and no hepatojugular reflux; brisk carotid pulses without delay and no carotid bruits Chest: clear to auscultation, no signs of consolidation by percussion or palpation, normal fremitus, symmetrical and full respiratory excursions Cardiovascular: normal position and quality of the apical impulse, regular rhythm, normal first and second heart sounds, no murmurs, rubs or gallops Abdomen: no tenderness or distention, no masses by palpation, no abnormal pulsatility or arterial bruits, normal bowel sounds, no hepatosplenomegaly Extremities: no clubbing, cyanosis or edema; 2+ radial, ulnar and brachial pulses bilaterally; 2+ right femoral, posterior tibial and dorsalis pedis pulses; 2+ left femoral, posterior tibial and dorsalis pedis pulses; no subclavian or femoral bruits Neurological: grossly nonfocal Psych: Normal mood and affect    ASSESSMENT & PLAN:    1. Coronary artery disease of native artery of native heart with stable angina pectoris ([place])   2. Heterozygous familial hypercholesterolemia   3. Essential hypertension   4. PVC's (premature ventricular contractions)   5. Sinus tachycardia       CAD: I think Catherin continues to describe stable angina pectoris, CCS functional class I-2.  The symptoms only occur when she is both performing greater than usual  physical activities and the environment is unfavorable (very hot conditions).  She has not had any symptoms at rest.  She is on 3 antianginal medications (amlodipine, isosorbide nitrate, metoprolol).  I would recommend avoiding exercise during unfavorable weather.  She is on aspirin and effective lipid-lowering medications. HFH: She had very severe hypercholesterolemia at baseline but has an excellent response to combination statin and a PCSK9 inhibitor.  She has very mild residual hypertriglyceridemia that does not warrant an independent pharmacological agent. HTN: Very well controlled. PVCs: Not currently bothering her.  She  did not tolerate higher doses of beta-blocker due to fatigue. Tachycardia: Sounds very much consistent with sinus tachycardia.  Advised her however that she can use her phone to keep a record of the ECG rhythm strip that she can send for analysis.  I do not think it is justified to perform any type of arrhythmia monitoring at this time.  Patient Instructions  Medication Instructions:  No changes *If you need a refill on your cardiac medications before your next appointment, please call your pharmacy*   Lab Work: None ordered If you have labs (blood work) drawn today and your tests are completely normal, you will receive your results only by: [place] (if you have MyChart) OR A paper copy in the mail If you have any lab test that is abnormal or we need to change your treatment, we will call you to review the results.   Testing/Procedures: None ordered   Follow-Up: At [facility], you and your health needs are our priority.  As part of our continuing mission to provide you with exceptional heart care, we have created designated Provider Care Teams.  These Care Teams include your primary Cardiologist (physician) and Advanced Practice Providers (APPs -  Physician Assistants and Nurse Practitioners) who all work together to provide you with the care you need, when  you need it.  We recommend signing up for the patient portal called "MyChart".  Sign up information is provided on this After Visit Summary.  MyChart is used to connect with patients for Virtual Visits (Telemedicine).  Patients are able to view lab/test results, encounter notes, upcoming appointments, etc.  Non-urgent messages can be sent to your provider as well.   To learn more about what you can do with MyChart, go to [email or web address].    Your next appointment:   6 month(s)  The format for your next appointment:   In Person  Provider:   You may see Sanda Klein, MD or one of the following Advanced Practice Providers on your designated Care Team:   Almyra Deforest, PA-C Fabian Sharp, [place] or  Roby Lofts, PA-C   Signed, Sanda Klein, MD  08/18/2020 2:26 PM    [place]

## 2020-08-16 NOTE — Patient Instructions (Signed)

## 2020-08-17 ENCOUNTER — Ambulatory Visit: Payer: BC Managed Care – PPO | Admitting: Cardiovascular Disease

## 2020-08-18 DIAGNOSIS — I493 Ventricular premature depolarization: Secondary | ICD-10-CM | POA: Insufficient documentation

## 2020-08-18 DIAGNOSIS — I25118 Atherosclerotic heart disease of native coronary artery with other forms of angina pectoris: Secondary | ICD-10-CM | POA: Insufficient documentation

## 2020-10-17 ENCOUNTER — Encounter: Payer: Self-pay | Admitting: Internal Medicine

## 2020-11-07 DIAGNOSIS — R7301 Impaired fasting glucose: Secondary | ICD-10-CM | POA: Diagnosis not present

## 2020-11-07 DIAGNOSIS — I2510 Atherosclerotic heart disease of native coronary artery without angina pectoris: Secondary | ICD-10-CM | POA: Diagnosis not present

## 2020-11-15 ENCOUNTER — Encounter: Payer: Self-pay | Admitting: Internal Medicine

## 2020-11-15 DIAGNOSIS — Z1331 Encounter for screening for depression: Secondary | ICD-10-CM | POA: Diagnosis not present

## 2020-11-15 DIAGNOSIS — I2510 Atherosclerotic heart disease of native coronary artery without angina pectoris: Secondary | ICD-10-CM | POA: Diagnosis not present

## 2020-11-15 DIAGNOSIS — Z1212 Encounter for screening for malignant neoplasm of rectum: Secondary | ICD-10-CM | POA: Diagnosis not present

## 2020-11-15 DIAGNOSIS — Z23 Encounter for immunization: Secondary | ICD-10-CM | POA: Diagnosis not present

## 2020-11-15 DIAGNOSIS — Z0000 Encounter for general adult medical examination without abnormal findings: Secondary | ICD-10-CM | POA: Diagnosis not present

## 2020-11-15 DIAGNOSIS — Z1389 Encounter for screening for other disorder: Secondary | ICD-10-CM | POA: Diagnosis not present

## 2020-11-15 DIAGNOSIS — R82998 Other abnormal findings in urine: Secondary | ICD-10-CM | POA: Diagnosis not present

## 2020-11-23 ENCOUNTER — Other Ambulatory Visit: Payer: Self-pay

## 2020-11-23 ENCOUNTER — Encounter: Payer: Self-pay | Admitting: Internal Medicine

## 2020-11-23 ENCOUNTER — Ambulatory Visit (AMBULATORY_SURGERY_CENTER): Payer: BC Managed Care – PPO

## 2020-11-23 VITALS — Ht 66.0 in | Wt 192.0 lb

## 2020-11-23 DIAGNOSIS — Z86010 Personal history of colonic polyps: Secondary | ICD-10-CM

## 2020-11-23 DIAGNOSIS — Z800 Family history of malignant neoplasm of digestive organs: Secondary | ICD-10-CM

## 2020-11-23 MED ORDER — PEG-KCL-NACL-NASULF-NA ASC-C 100 G PO SOLR: 1.0000 | Freq: Once | ORAL | 0 refills | Status: AC

## 2020-11-23 NOTE — Progress Notes (Signed)
Pre visit completed via phone call; Patient verified name, DOB, and address; No egg or soy allergy known to patient  No issues known to pt with past sedation with any surgeries or procedures----patient reports having itching with every anesthesia procedure= was given Benadryl and itching relieved Patient denies ever being told they had issues or difficulty with intubation  No FH of Malignant Hyperthermia Pt is not on diet pills Pt is not on home 02  Pt is not on blood thinners  Pt denies issues with constipation -- uses stool softener---advised to increase fluids, veggies, activity No A fib or A flutter Pt is fully vaccinated for Covid x 2 Coupon given to pt in PV today, Code to Pharmacy and NO PA's for preps discussed with pt in PV today  Discussed with pt there will be an out-of-pocket cost for prep and that varies from $0 to 70 + dollars - pt verbalized understanding  Due to the COVID-19 pandemic we are asking patients to follow certain guidelines in PV and the [place]   Pt aware of COVID protocols and [place] guidelines    Patient reports she is wanting Moviprep like she had the last colonoscopy; prep changed in system- RX sent and patient re-advised about insurance may not cover prep as requested;

## 2020-12-07 ENCOUNTER — Other Ambulatory Visit: Payer: Self-pay

## 2020-12-07 ENCOUNTER — Encounter: Payer: Self-pay | Admitting: Internal Medicine

## 2020-12-07 ENCOUNTER — Ambulatory Visit (AMBULATORY_SURGERY_CENTER): Payer: BC Managed Care – PPO | Admitting: Internal Medicine

## 2020-12-07 VITALS — BP 96/56 | HR 67 | Temp 98.6°F | Resp 11 | Ht 66.0 in | Wt 192.0 lb

## 2020-12-07 DIAGNOSIS — D124 Benign neoplasm of descending colon: Secondary | ICD-10-CM

## 2020-12-07 DIAGNOSIS — Z86010 Personal history of colonic polyps: Secondary | ICD-10-CM

## 2020-12-07 DIAGNOSIS — Z1211 Encounter for screening for malignant neoplasm of colon: Secondary | ICD-10-CM | POA: Diagnosis not present

## 2020-12-07 DIAGNOSIS — Z800 Family history of malignant neoplasm of digestive organs: Secondary | ICD-10-CM

## 2020-12-07 MED ORDER — SODIUM CHLORIDE 0.9 % IV SOLN: 500.0000 mL | Freq: Once | INTRAVENOUS | Status: DC

## 2020-12-07 NOTE — Op Note (Signed)
[facility] Patient Name: Nancy Sparks Procedure Date: 12/07/2020 9:19 AM MRN: [id number] Endoscopist: Gatha Mayer , MD Age: 55 Referring MD:  Date of Birth: 10/16/1965 Gender: Female Account #: [id number] Procedure:                Colonoscopy Indications:              Surveillance: Personal history of adenomatous                            polyps on last colonoscopy 5 years ago Medicines:                Propofol per Anesthesia, Monitored Anesthesia Care Procedure:                Pre-Anesthesia Assessment:                           - Prior to the procedure, a History and Physical                            was performed, and patient medications and                            allergies were reviewed. The patient's tolerance of                            previous anesthesia was also reviewed. The risks                            and benefits of the procedure and the sedation                            options and risks were discussed with the patient.                            All questions were answered, and informed consent                            was obtained. Prior Anticoagulants: The patient has                            taken no previous anticoagulant or antiplatelet                            agents. ASA Grade Assessment: II - A patient with                            mild systemic disease. After reviewing the risks                            and benefits, the patient was deemed in                            satisfactory condition to undergo the procedure.  After obtaining informed consent, the colonoscope                            was passed under direct vision. Throughout the                            procedure, the patient's blood pressure, pulse, and                            oxygen saturations were monitored continuously. The                            Olympus CF-HQ190L [id number]) Colonoscope was                             introduced through the anus and advanced to the the                            cecum, identified by appendiceal orifice and                            ileocecal valve. The colonoscopy was performed                            without difficulty. The patient tolerated the                            procedure well. The quality of the bowel                            preparation was excellent. The ileocecal valve,                            appendiceal orifice, and rectum were photographed.                            The bowel preparation used was Miralax via split                            dose instruction. Scope In: 9:29:38 AM Scope Out: 0:27:74 AM Scope Withdrawal Time: 0 hours 14 minutes 24 seconds  Total Procedure Duration: 0 hours 17 minutes 3 seconds  Findings:                 The perianal and digital rectal examinations were                            normal.                           A 2 mm polyp was found in the descending colon. The                            polyp was sessile. The polyp was removed with a  cold snare. Resection and retrieval were complete.                            Verification of patient identification for the                            specimen was done. Estimated blood loss was minimal.                           Multiple diverticula were found in the sigmoid                            colon.                           The exam was otherwise without abnormality on                            direct and retroflexion views. Complications:            No immediate complications. Estimated Blood Loss:     Estimated blood loss was minimal. Impression:               - One 2 mm polyp in the descending colon, removed                            with a cold snare. Resected and retrieved.                           - Diverticulosis in the sigmoid colon.                           - The examination was otherwise normal on direct                             and retroflexion views.                           - Personal history of colonic polyps. 2004 TV                            adenoma                           2011 no adenomas                           06/21/2015 5 mm cecal and 2 mm rectal polyps cecal                            adenoma rectal hyperplastic                           - Family history of colon cancer - sister Recommendation:           - Patient has a contact number available for  emergencies. The signs and symptoms of potential                            delayed complications were discussed with the                            patient. Return to normal activities tomorrow.                            Written discharge instructions were provided to the                            patient.                           - Resume previous diet.                           - Continue present medications.                           - Repeat colonoscopy is recommended for                            surveillance. The colonoscopy date will be                            determined after pathology results from today's                            exam become available for review. Likely repeat 5                            years Gatha Mayer, MD 12/07/2020 9:58:15 AM This report has been signed electronically.

## 2020-12-07 NOTE — Progress Notes (Signed)
Called to room to assist during endoscopic procedure.  Patient ID and intended procedure confirmed with present staff. Received instructions for my participation in the procedure from the performing physician.  

## 2020-12-07 NOTE — Progress Notes (Signed)
Sedate, gd SR, tolerated procedure well, VSS, report to RN 

## 2020-12-07 NOTE — Patient Instructions (Addendum)
I found and removed one tiny polyp.  I will let you know pathology results and when to have another routine colonoscopy by mail and/or My Chart. Should be 5 years.  I appreciate the opportunity to care for you. Gatha Mayer, MD, [facility]  Handouts on diverticulosis and polyps given to patient. Await pathology results. Resume previous diet and continue present medications.    YOU HAD AN ENDOSCOPIC PROCEDURE TODAY AT [facility]:   Refer to the procedure report that was given to you for any specific questions about what was found during the examination.  If the procedure report does not answer your questions, please call your gastroenterologist to clarify.  If you requested that your care partner not be given the details of your procedure findings, then the procedure report has been included in a sealed envelope for you to review at your convenience later.  YOU SHOULD EXPECT: Some feelings of bloating in the abdomen. Passage of more gas than usual.  Walking can help get rid of the air that was put into your GI tract during the procedure and reduce the bloating. If you had a lower endoscopy (such as a colonoscopy or flexible sigmoidoscopy) you may notice spotting of blood in your stool or on the toilet paper. If you underwent a bowel prep for your procedure, you may not have a normal bowel movement for a few days.  Please Note:  You might notice some irritation and congestion in your nose or some drainage.  This is from the oxygen used during your procedure.  There is no need for concern and it should clear up in a day or so.  SYMPTOMS TO REPORT IMMEDIATELY:  Following lower endoscopy (colonoscopy or flexible sigmoidoscopy):  Excessive amounts of blood in the stool  Significant tenderness or worsening of abdominal pains  Swelling of the abdomen that is new, acute  Fever of 100F or higher  For urgent or emergent issues, a gastroenterologist can be reached at any hour by calling  [phone]. Do not use MyChart messaging for urgent concerns.    DIET:  We do recommend a small meal at first, but then you may proceed to your regular diet.  Drink plenty of fluids but you should avoid alcoholic beverages for 24 hours.  ACTIVITY:  You should plan to take it easy for the rest of today and you should NOT DRIVE or use heavy machinery until tomorrow (because of the sedation medicines used during the test).    FOLLOW UP: Our staff will call the number listed on your records 48-72 hours following your procedure to check on you and address any questions or concerns that you may have regarding the information given to you following your procedure. If we do not reach you, we will leave a message.  We will attempt to reach you two times.  During this call, we will ask if you have developed any symptoms of COVID 19. If you develop any symptoms (ie: fever, flu-like symptoms, shortness of breath, cough etc.) before then, please call [phone].  If you test positive for Covid 19 in the 2 weeks post procedure, please call and report this information to [place].    If any biopsies were taken you will be contacted by phone or by letter within the next 1-3 weeks.  Please call us at [phone] if you have not heard about the biopsies in 3 weeks.    SIGNATURES/CONFIDENTIALITY: You and/or your care partner have signed paperwork which  will be entered into your electronic medical record.  These signatures attest to the fact that that the information above on your After Visit Summary has been reviewed and is understood.  Full responsibility of the confidentiality of this discharge information lies with you and/or your care-partner.

## 2020-12-07 NOTE — Progress Notes (Signed)
Pt's states no medical or surgical changes since previsit or office visit. VS assessed by C.W 

## 2020-12-07 NOTE — Progress Notes (Signed)
[facility] History and Physical   Primary Care Physician:  Tisovec, Fransico Him, MD   Reason for Procedure:   Hx colon polyps and FHx CRCA  Plan:    colonoscopy     HPI: Nancy Sparks is a 55 y.o. female w/ hx adenomatous colon polyp and sister w/ colon cancer here for surveillance colonoscopy.   Past Medical History:  Diagnosis Date   Anemia    "w/pregnancies   Anxiety    hx of   CAD (coronary artery disease)    LHC 6/19: pLAD, oD1 75, pRCA 20, mRCA 90 >> PCI: DES to RCA // Echo 6/19: EF 55-60, normal wall motion   Complication of anesthesia    itching relieved with benadryl w/all my surgeries (07/24/2017)   Depression    hx   Dyslipidemia    Fibroid    GERD (gastroesophageal reflux disease)    PRN meds   Gestational diabetes    hx of   Heart murmur    hx   Heart palpitations    hx of   Hx of adenomatous polyp of colon 12/24/2002   Hyperlipidemia    on meds   Hypertension    on meds    Past Surgical History:  Procedure Laterality Date   [place]; 1998; 2001   COLONOSCOPY  2017   CG-MAC-movi(good)-2 polyps-TA x 1, tics   CORONARY STENT INTERVENTION N/A 07/28/2017   Procedure: CORONARY STENT INTERVENTION;  Surgeon: Troy Sine, MD;  Location: [place] [facility];  Service: Cardiovascular;  Laterality: N/A;  RCA   LEFT HEART CATH AND CORONARY ANGIOGRAPHY N/A 07/28/2017   Procedure: LEFT HEART CATH AND CORONARY ANGIOGRAPHY;  Surgeon: Troy Sine, MD;  Location: [place] [facility];  Service: Cardiovascular;  Laterality: N/A;   LEFT HEART CATH AND CORONARY ANGIOGRAPHY N/A 09/04/2017   Procedure: LEFT HEART CATH AND CORONARY ANGIOGRAPHY;  Surgeon: Sherren Mocha, MD;  Location: [place];  Service: Cardiovascular;  Laterality: N/A;   LUMBAR LAMINECTOMY/DECOMPRESSION MICRODISCECTOMY  04/25/2011   Procedure: LUMBAR LAMINECTOMY/DECOMPRESSION MICRODISCECTOMY 1 LEVEL;  Surgeon: Elaina Hoops, MD;  Location: [place]  [facility];  Service: Neurosurgery;  Laterality: Left;  Left Lumbar Three-Four Laminectomy and Diskectomy   LUMBAR LAMINECTOMY/DECOMPRESSION MICRODISCECTOMY  04/26/2011   Procedure: LUMBAR LAMINECTOMY/DECOMPRESSION MICRODISCECTOMY 1 LEVEL;  Surgeon: Elaina Hoops, MD;  Location: [place] [facility];  Service: Neurosurgery;  Laterality: N/A;  Re-Exploration of Lumbar Wound and Evacuation of Epidural Hematoma   NOVASURE ABLATION  2006   POLYPECTOMY     TA x 1   TUBAL LIGATION  2001   WISDOM TOOTH EXTRACTION      Prior to Admission medications   Medication Sig Start Date End Date Taking? Authorizing Provider  ALPRAZolam Duanne Moron) 0.5 MG tablet Take 0.5 mg by mouth 2 (two) times daily as needed. 09/05/20  Yes [provider]  amLODipine (NORVASC) 5 MG tablet Take 1 tablet (5 mg total) by mouth daily. 02/24/20  Yes Croitoru, Mihai, MD  aspirin 81 MG EC tablet Take 1 tablet (81 mg total) by mouth daily. 04/14/20  Yes Croitoru, Mihai, MD  isosorbide mononitrate (IMDUR) 30 MG 24 hr tablet Take 1 tablet (30 mg total) by mouth daily. NEEDS APPOINTMENT FOR FUTURE REFILLS 07/19/20  Yes Croitoru, Mihai, MD  metoprolol tartrate (LOPRESSOR) 50 MG tablet Take 1 tablet (50 mg total) by mouth 2 (two) times daily. 03/23/20 12/07/20 Yes Croitoru, Dani Gobble, MD  rosuvastatin (  CRESTOR) 40 MG tablet TAKE 1 TABLET(40 MG) BY MOUTH AT BEDTIME 02/07/20  Yes Croitoru, Mihai, MD  trazodone (DESYREL) 300 MG tablet Take 0.5 tablets (150 mg total) by mouth at bedtime. Take at bedtime as needed 06/07/20  Yes Princess Bruins, MD  Evolocumab (REPATHA SURECLICK) 202 MG/ML SOAJ Inject 1 pen every 14 days subutaneously 03/21/20   Croitoru, Mihai, MD  nitroGLYCERIN (NITROSTAT) 0.4 MG SL tablet Place 1 tablet (0.4 mg total) under the tongue every 5 (five) minutes as needed for chest pain. 07/31/17 11/23/20  Croitoru, Mihai, MD  STOOL SOFTENER 100 MG capsule Take 1 capsule by mouth as needed.  Patient not taking: Reported on 12/07/2020 11/22/17    [provider]  Tetrahydrozoline HCl (VISINE OP) Place 1 drop into both eyes daily as needed (dry eyes). Patient not taking: Reported on 12/07/2020    [provider]    Current Outpatient Medications  Medication Sig Dispense Refill   ALPRAZolam (XANAX) 0.5 MG tablet Take 0.5 mg by mouth 2 (two) times daily as needed.     amLODipine (NORVASC) 5 MG tablet Take 1 tablet (5 mg total) by mouth daily. 90 tablet 3   aspirin 81 MG EC tablet Take 1 tablet (81 mg total) by mouth daily. 90 tablet 2   isosorbide mononitrate (IMDUR) 30 MG 24 hr tablet Take 1 tablet (30 mg total) by mouth daily. NEEDS APPOINTMENT FOR FUTURE REFILLS 90 tablet 1   metoprolol tartrate (LOPRESSOR) 50 MG tablet Take 1 tablet (50 mg total) by mouth 2 (two) times daily. 180 tablet 3   rosuvastatin (CRESTOR) 40 MG tablet TAKE 1 TABLET(40 MG) BY MOUTH AT BEDTIME 90 tablet 3   trazodone (DESYREL) 300 MG tablet Take 0.5 tablets (150 mg total) by mouth at bedtime. Take at bedtime as needed 45 tablet 4   Evolocumab (REPATHA SURECLICK) 542 MG/ML SOAJ Inject 1 pen every 14 days subutaneously 2 mL 11   nitroGLYCERIN (NITROSTAT) 0.4 MG SL tablet Place 1 tablet (0.4 mg total) under the tongue every 5 (five) minutes as needed for chest pain. 90 tablet 3   STOOL SOFTENER 100 MG capsule Take 1 capsule by mouth as needed.  (Patient not taking: Reported on 12/07/2020)  0   Tetrahydrozoline HCl (VISINE OP) Place 1 drop into both eyes daily as needed (dry eyes). (Patient not taking: Reported on 12/07/2020)     Current Facility-Administered Medications  Medication Dose Route Frequency Provider Last Rate Last Admin   0.9 %  sodium chloride infusion  500 mL Intravenous Once Gatha Mayer, MD        Allergies as of 12/07/2020 - Review Complete 12/07/2020  Allergen Reaction Noted   Latex Rash 05/14/2011   Tape Rash 04/24/2011   Tapentadol Rash 04/24/2011    Family History  Problem Relation Age of Onset   Colon polyps Mother  68   Hyperlipidemia Mother    Hypertension Mother    Anesthesia problems Mother        nausea   Coronary artery disease Father        CABGx3 age 11   Hyperlipidemia Father    Atrial fibrillation Father    Hypertension Father    Heart disease Father    Diabetes Father    Colon polyps Sister 58   Hyperlipidemia Sister        X2   Thyroid disease Sister    Anesthesia problems Sister        nausea   Colon cancer Sister  66   Hyperlipidemia Brother        x2   Esophageal cancer Neg Hx    Stomach cancer Neg Hx    Rectal cancer Neg Hx     Social History   Socioeconomic History   Marital status: Married    Spouse name: Not on file   Number of children: 3   Years of education: Not on file   Highest education level: Not on file  Occupational History   Occupation: stay at home mom  Tobacco Use   Smoking status: Former    Packs/day: 0.10    Years: 2.00    Pack years: 0.20    Types: Cigarettes   Smokeless tobacco: Never   Tobacco comments:    quit smoking in college  Vaping Use   Vaping Use: Never used  Substance and Sexual Activity   Alcohol use: Yes    Alcohol/week: 7.0 - 8.0 standard drinks    Types: 7 - 8 Standard drinks or equivalent per week   Drug use: Not Currently    Types: Marijuana    Comment: 07/24/2017 "in college"   Sexual activity: Yes    Partners: Male    Birth control/protection: Surgical    Comment: 1st intercourse- 17, partners- 2, married- 20 yrs   Other Topics Concern   Not on file  Social History Narrative   Married to [place]   2 daughters and 1 son   Former Education officer, museum - homemaker now   6 drinks/week, no tobacco, no drug use      Social Determinants of Health    Review of Systems:  All other review of systems negative except as mentioned in the HPI.  Physical Exam: Vital signs BP 119/79   Pulse 66   Temp 98.6 F (37 C) (Skin)   Ht _0  (1.676 m)   Wt 192 lb (87.1 kg)   SpO2 98%   BMI 30.99 kg/m   General:   Alert,   Well-developed, well-nourished, pleasant and cooperative in NAD Lungs:  Clear throughout to auscultation.   Heart:  Regular rate and rhythm; no murmurs, clicks, rubs,  or gallops. Abdomen:  Soft, nontender and nondistended. Normal bowel sounds.   Neuro/Psych:  Alert and cooperative. Normal mood and affect. A and O x 3   _1  E. Carlean Purl, MD, [facility] [phone] (pager) 12/07/2020 9:20 AM@

## 2020-12-11 ENCOUNTER — Encounter: Payer: Self-pay | Admitting: Internal Medicine

## 2020-12-11 ENCOUNTER — Telehealth: Payer: Self-pay | Admitting: *Deleted

## 2020-12-11 DIAGNOSIS — Z86010 Personal history of colonic polyps: Secondary | ICD-10-CM

## 2020-12-11 NOTE — Telephone Encounter (Signed)
Attempted to call patient for their post-procedure follow-up call. No answer. Left voicemail.   

## 2021-01-04 ENCOUNTER — Telehealth: Payer: Self-pay | Admitting: *Deleted

## 2021-01-04 NOTE — Telephone Encounter (Signed)
Patient called stating [employer] has removed trazodone 300 mg tablet from her formulary list. And is now listed as a non-formulary drug. She asked if you could provide a note saying why she needs to stay on medication,states she has been on Rx for several years now.  Patient reports she has tried Ambien and it made her feel sluggish and lunesta make her had a metal taste in her mouth. If you let me know what you would like the letter to say, I will type it. Please advise

## 2021-01-12 NOTE — Telephone Encounter (Signed)
Patient is going to get me the information to fax to [facility].

## 2021-01-15 NOTE — Telephone Encounter (Signed)
Patient called back and said she spoke with insurance and no letter needed now.

## 2021-01-27 ENCOUNTER — Ambulatory Visit (HOSPITAL_COMMUNITY)
Admission: EM | Admit: 2021-01-27 | Discharge: 2021-01-27 | Disposition: A | Discharge status: Home / Self Care | Payer: BC Managed Care – PPO | Attending: Family | Admitting: Family

## 2021-01-27 DIAGNOSIS — F4323 Adjustment disorder with mixed anxiety and depressed mood: Secondary | ICD-10-CM | POA: Diagnosis not present

## 2021-01-27 MED ORDER — HYDROXYZINE HCL 25 MG PO TABS: 25.0000 mg | ORAL_TABLET | Freq: Three times a day (TID) | ORAL | 0 refills | Status: DC | PRN

## 2021-01-27 MED ORDER — HYDROXYZINE HCL 25 MG PO TABS: 25.0000 mg | ORAL_TABLET | Freq: Three times a day (TID) | ORAL | Status: DC | PRN

## 2021-01-27 NOTE — ED Provider Notes (Signed)
Behavioral Health Urgent Care Medical Screening Exam  Patient Name: Nancy Sparks MRN: [id number] Date of Evaluation: 01/28/21 Chief Complaint:   " I am stressed" Diagnosis:  Final diagnoses:  Adjustment disorder with mixed anxiety and depressed mood    History of Present illness: Nancy Sparks is a 55 y.o. female.  Patient presents to [facility] urgent care accompanied by her husband.  Reporting worsening stress and anxiety.  States "everything is building up, its one thing after another."   Reports "we just sold her home which was stressful and we had to renegotiate with contractors and I do not feel loved."  She denied suicidal or homicidal ideations.  Denies auditory visual hallucinations. Reported " I just need to talk to some too."   Patient and husband reports patient is a "control freak and gets easily frazzled if things does not go her way".  Reported previous suicide attempt 15 years ago by overdose.  Denied that she has access to medications.  States her son has removed all medications due to recent mood irritability and depression. Nancy Sparks reports she was prescribed Xanax on the past and was hopeful to read new her medications. Denied patient has access to guns or weapons.  During evaluation Nancy Sparks is sitting in no acute distress. She is alert/oriented x 4; calm/cooperative; and mood congruent with affect.  Nancy Sparks is speaking in a clear tone at moderate volume, and normal pace; with good eye contact. Her thought process is coherent and relevant; There is no indication that she is currently responding to internal/external stimuli or experiencing delusional thought content; and she has denied suicidal/self-harm/homicidal ideation, psychosis, and paranoia. Patient has remained calm throughout assessment and has answered questions appropriately.    At this time Nancy Sparks is educated and verbalizes understanding of mental health resources and other crisis services in the  community. She is instructed to call 911 and present to the nearest emergency room should she experience any suicidal/homicidal ideation, auditory/visual/hallucinations, or detrimental worsening of her mental health condition.  She was a also advised by Probation officer that she could call the toll-free phone on insurance card to assist with identifying in network counselors and agencies or number on back of Medicaid card t speak with care coordinator    Psychiatric Specialty Exam  Presentation  General Appearance:Appropriate for Environment  Eye Contact:Good  Speech:Clear and Coherent  Speech Volume:Normal  Handedness:Right   Mood and Affect  Mood:Anxious; Depressed Affect:Congruent  Thought Process  Thought Processes:Coherent Descriptions of Associations:Intact  Orientation:Full (Time, Place and Person)  Thought Content:Logical    Hallucinations:None  Ideas of Reference:None  Suicidal Thoughts:No  Homicidal Thoughts:No   Sensorium  Memory:Immediate Good; Recent Good Judgment:Fair Insight:Fair  Executive Functions  Concentration:Fair Attention Span:Fair [place] Language:Good  Psychomotor Activity  Psychomotor Activity:Normal  Assets  Assets:Communication Skills; Intimacy  Sleep  Sleep:Good Number of hours: No data recorded  Nutritional Assessment (For OBS and FBC admissions only) Has the patient had a weight loss or gain of 10 pounds or more in the last 3 months?: No Has the patient had a decrease in food intake/or appetite?: No Does the patient have dental problems?: No Does the patient have eating habits or behaviors that may be indicators of an eating disorder including binging or inducing vomiting?: No Has the patient recently lost weight without trying?: 0 Has the patient been eating poorly because of a decreased appetite?: 0 Malnutrition Screening Tool Score: 0    Physical Exam: Physical Exam  Vitals and nursing note  reviewed.  HENT:     Head: Normocephalic.  Cardiovascular:     Rate and Rhythm: Normal rate.  Pulmonary:     Breath sounds: Normal breath sounds.  Neurological:     Mental Status: She is oriented to person, place, and time.  Psychiatric:        Attention and Perception: Attention normal.        Mood and Affect: Mood normal.        Speech: Speech normal.        Behavior: Behavior normal. Behavior is cooperative.        Thought Content: Thought content normal.        Cognition and Memory: Cognition normal.        Judgment: Judgment normal.   Review of Systems  HENT: Negative.    Eyes: Negative.   Respiratory: Negative.    Cardiovascular: Negative.   Gastrointestinal: Negative.   Psychiatric/Behavioral:  Positive for depression. Negative for suicidal ideas. The patient is nervous/anxious.   All other systems reviewed and are negative. Blood pressure (!) 167/99, pulse (!) 104, resp. rate 20, SpO2 100 %. There is no height or weight on file to calculate BMI.  Musculoskeletal: Strength & Muscle Tone: within normal limits Gait & Station: normal Patient leans: N/A   [place] [facility] Discharge Disposition for Follow up and Recommendations: Based on my evaluation the patient does not appear to have an emergency medical condition and can be discharged with resources and follow up care in outpatient services for Medication Management    Derrill [place], NP 01/28/2021, 9:39 AM

## 2021-01-27 NOTE — BH Assessment (Addendum)
Comprehensive Clinical Assessment (CCA) Note  01/27/2021 Nancy Sparks [id number]  Disposition: Per Ricky Ala, NP, pt is psych cleared.   [place] ED from 01/27/2021 in [place]      The patient demonstrates the following risk factors for suicide: Chronic risk factors for suicide include: psychiatric disorder of depression and previous suicide attempts x1 . Acute risk factors for suicide include: family or marital conflict. Protective factors for this patient include: positive social support, responsibility to others (children, family), and hope for the future. Considering these factors, the overall suicide risk at this point appears to be low. Patient is appropriate for outpatient follow up.  Patient to [facility] with her family reporting passive SI last night without a plan. Pt denies SI, HI, AVH currently. Pt reports stressors to include her husband had a "cancer scare" in November along with other issues including complications with a recent move. Pt reports that she was forced to go on vacation some time ago with her family and she did not want to and when they returned home there were complications with them moving. Pt provides general information about other triggers and state that "things did not fall into place like they should have". Pt reports she has been feeling stressed for the past month. Patient reports she has been tearful since 11pm last night.   Pt does not have outpatient services currently however reports her gynecologist prescribed her trazodone for sleep. Patient reports history of inpatient treatment about 15 years ago. Patient denies substance use however reports drinking 2 glasses of wine last night.   Patient is oriented x5, engaged, alert and cooperative. Patient eye contact is normal and speech a little pressured. Patient is very tearful and presents depressed. Patient denies SI today however reports  passive SI last night. Patient report x1 suicide attempt 15 years ago which required hospitalization at [facility]. Patient contracts for safety and states I don't want to die; I just want some things to change. Patient denies HI and AVH.        Chief Complaint:  Chief Complaint  Patient presents with   Depression   Suicidal   Visit Diagnosis: MDD, severe, recurrent, without psychotic features    CCA Screening, Triage and Referral (STR)  Patient Reported Information How did you hear about [place]? Other (Comment)  What Is the Reason for Your Visit/Call Today? Depression and suicidal ideations  How Long Has This Been Causing You Problems? 1-6 months  What Do You Feel Would Help You the Most Today? Treatment for Depression or other mood problem   Have You Recently Had Any Thoughts About Hurting Yourself? Yes  Are You Planning to Commit Suicide/Harm Yourself At This time? No   Have you Recently Had Thoughts About [place]? No  Are You Planning to Harm Someone at This Time? No  Explanation: No data recorded  Have You Used Any Alcohol or Drugs in the Past 24 Hours? Yes  How Long Ago Did You Use Drugs or Alcohol? No data recorded What Did You Use and How Much? 2 glasses of wine   Do You Currently Have a Therapist/Psychiatrist? No data recorded Name of Therapist/Psychiatrist: No data recorded  Have You Been Recently Discharged From Any Office Practice or Programs? No data recorded Explanation of Discharge From Practice/Program: No data recorded    CCA Screening Triage Referral Assessment Type of Contact: No data recorded Telemedicine Service Delivery:   Is this Initial  or Reassessment? No data recorded Date Telepsych consult ordered in [facility]:  No data recorded Time Telepsych consult ordered in [facility]:  No data recorded Location of Assessment: No data recorded Provider Location: No data recorded  Collateral Involvement: No data recorded  Does Patient Have a [place]? No data recorded Name and Contact of Legal Guardian: No data recorded If Minor and Not Living with Parent(s), Who has Custody? No data recorded Is CPS involved or ever been involved? No data recorded Is APS involved or ever been involved? No data recorded  Patient Determined To Be At Risk for Harm To Self or Others Based on Review of Patient Reported Information or Presenting Complaint? No data recorded Method: No data recorded Availability of Means: No data recorded Intent: No data recorded Notification Required: No data recorded Additional Information for Danger to Others Potential: No data recorded Additional Comments for Danger to Others Potential: No data recorded Are There Guns or Other Weapons in Your Home? No data recorded Types of Guns/Weapons: No data recorded Are These Weapons Safely Secured?                            No data recorded Who Could Verify You Are Able To Have These Secured: No data recorded Do You Have any Outstanding Charges, Pending Court Dates, Parole/Probation? No data recorded Contacted To Inform of Risk of Harm To Self or Others: No data recorded   Does Patient Present under Involuntary Commitment? No data recorded IVC Papers Initial File Date: No data recorded  [place] of Residence: No data recorded  Patient Currently Receiving the Following Services: No data recorded  Determination of Need: Urgent (48 hours)   Options For Referral: Outpatient Therapy; Medication Management     CCA Biopsychosocial Patient Reported Schizophrenia/Schizoaffective Diagnosis in Past: No data recorded  Strengths: No data recorded  Mental Health Symptoms Depression:   Change in energy/activity; Difficulty Concentrating; Sleep (too much or little); Tearfulness   Duration of Depressive symptoms:  Duration of Depressive Symptoms: Greater than two weeks   Mania:   None   Anxiety:    Worrying; Tension; Sleep; Difficulty concentrating    Psychosis:   None   Duration of Psychotic symptoms:    Trauma:   N/A   Obsessions:   N/A   Compulsions:   N/A   Inattention:   N/A   Hyperactivity/Impulsivity:  No data recorded  Oppositional/Defiant Behaviors:   N/A   Emotional Irregularity:   N/A   Other Mood/Personality Symptoms:  No data recorded   Mental Status Exam Appearance and self-care  Stature:   Average   Weight:   Average weight   Clothing:   Neat/clean   Grooming:   Well-groomed   Cosmetic use:   Age appropriate   Posture/gait:   Normal   Motor activity:   Not Remarkable   Sensorium  Attention:   Normal   Concentration:   Normal   Orientation:   X5   Recall/memory:   Normal   Affect and Mood  Affect:   Depressed; Tearful   Mood:   Depressed   Relating  Eye contact:   Normal   Facial expression:   Depressed   Attitude toward examiner:   Cooperative   Thought and Language  Speech flow:  Clear and Coherent   Thought content:   Appropriate to Mood and Circumstances   Preoccupation:   None   Hallucinations:   None  Organization:  No data recorded  [employer] of Knowledge:   Good   Intelligence:   Average   Abstraction:   Normal   Judgement:   Fair   Art therapist:   Adequate   Insight:   Fair   Decision Making:   Normal   Social Functioning  Social Maturity:   Responsible   Social Judgement:   Normal   Stress  Stressors:   Illness; Housing; Relationship   Coping Ability:   Overwhelmed   Skill Deficits:   None   Supports:   Family     Religion:    Leisure/Recreation:    Exercise/Diet: Exercise/Diet Do You Have Any Trouble Sleeping?: Yes   CCA Employment/Education Employment/Work Situation: Employment / Work Situation Employment Situation: Employed  Education: Education Is Patient Currently Attending School?: No   CCA Family/Childhood History Family and Relationship History: Family  history Marital status: Married Does patient have children?: Yes  Childhood History:     Child/Adolescent Assessment:     CCA Substance Use Alcohol/Drug Use: Alcohol / Drug Use Pain Medications: See MAR Prescriptions: See MAR Over the Counter: See MAR History of alcohol / drug use?: No history of alcohol / drug abuse                         ASAM's:  Six Dimensions of Multidimensional Assessment  Dimension 1:  Acute Intoxication and/or Withdrawal Potential:      Dimension 2:  Biomedical Conditions and Complications:      Dimension 3:  Emotional, Behavioral, or Cognitive Conditions and Complications:     Dimension 4:  Readiness to Change:     Dimension 5:  Relapse, Continued use, or Continued Problem Potential:     Dimension 6:  Recovery/Living Environment:     ASAM Severity Score:    ASAM Recommended Level of Treatment:     Substance use Disorder (SUD)    Recommendations for Services/Supports/Treatments:    Discharge Disposition:    DSM5 Diagnoses: Patient Active Problem List   Diagnosis Date Noted   Coronary artery disease of native artery of native heart with stable angina pectoris ([place]) 08/18/2020   PVC's (premature ventricular contractions) 08/18/2020   Sigmoid diverticulitis 11/26/2017   Heterozygous familial hypercholesterolemia 11/02/2017   Coronary artery disease involving native coronary artery with angina pectoris ([place])    HLD (hyperlipidemia) 07/24/2017   Libido, decreased 01/01/2016   Hot flashes 01/01/2016   Insomnia 01/01/2016   Essential hypertension 01/03/2015   Family history of colon cancer - sister 04/24/2014   OAB (overactive bladder) 11/04/2013   Heart palpitations    Hx of adenomatous polyp of colon 12/24/2002     Referrals to Alternative Service(s): Referred to Alternative Service(s):   Place:   Date:   Time:    Referred to Alternative Service(s):   Place:   Date:   Time:    Referred to Alternative Service(s):   Place:    Date:   Time:    Referred to Alternative Service(s):   Place:   Date:   Time:     Luther Redo, [facility]

## 2021-01-27 NOTE — BH Assessment (Addendum)
Pt to [facility] with her family reporting passive SI last night without a plan. Pt denies SI, HI, AVH at this time. Pt reports stressors to include her husband had a "cancer scare" in November along with other issues including complications with a recent move. Pt provides general information about other triggers and state that "things did not fall into place like they should have". Pt does not have outpatient services however reports her gynecologist prescribed her trazodone for sleep.   Pt is urgent

## 2021-01-27 NOTE — Discharge Instructions (Signed)
Take all medications as prescribed. Keep all follow-up appointments as scheduled.  Do not consume alcohol or use illegal drugs while on prescription medications. Report any adverse effects from your medications to your primary care provider promptly.  In the event of recurrent symptoms or worsening symptoms, call 911, a crisis hotline, or go to the nearest emergency department for evaluation.   

## 2021-02-01 ENCOUNTER — Other Ambulatory Visit: Payer: Self-pay

## 2021-02-01 MED ORDER — ISOSORBIDE MONONITRATE ER 30 MG PO TB24: 30.0000 mg | ORAL_TABLET | Freq: Every day | ORAL | 3 refills | Status: DC

## 2021-02-01 MED ORDER — ASPIRIN 81 MG PO TBEC: 81.0000 mg | DELAYED_RELEASE_TABLET | Freq: Every day | ORAL | 3 refills | Status: DC

## 2021-02-07 ENCOUNTER — Telehealth (HOSPITAL_COMMUNITY): Payer: Self-pay | Admitting: Internal Medicine

## 2021-02-07 NOTE — BH Assessment (Signed)
Care Management - [facility]UC Follow Up Discharges  ° °Writer attempted to make contact with patient today and was unsuccessful.  Writer left a HIPPA compliant voice message.  ° °Per chart review, patient was provided with outpatient resources. ° °

## 2021-02-13 NOTE — Progress Notes (Signed)
Cardiology Office Note:    Date:  02/14/2021   ID:  Nancy Sparks, DOB 1965/06/24, MRN [id number]  PCP:  Haywood Pao, MD [place] Cardiologist: Sanda Klein, MD   Reason for visit: 38-monthfollow-up  History of Present Illness:    Nancy Sparks a 56y.o. female with a hx of hypertension, severe hypercholesterolemia, CAD with drug-eluting stent to the right coronary artery in 2019, possible coronary vasospasm, PVCs.  She last saw Dr. CSallyanne Kusterin July 2022.  She noted some chest tightness when walking uphill and during exercise particularly with hot weather.  Dr. CSallyanne Kusterthought this is stable angina.  She is continued on her anti-anginals.    Today, she states significant as she is in the middle of a move.  [place]  She mentions some neck tightness following the move that can occur spontaneously.  This is different than her typical angina which is more tightness in her upper chest.  She feels like her angina overall has improved since last visit.  She has felt some shortness of breath with lifting boxes.  She wonders if this is related to her weight gain of 40 pounds recently.  She had weight gain following back surgery and menopause.  She has restarted weight watchers which she has had success with in the past.  She walks 4 to 5 miles at a time and plays tennis and pickleball.  Currently palpitations are rare.  They are worse in the summer if she is dehydrated.  She denies lower extremity edema.     Past Medical History:  Diagnosis Date   Anemia    "w/pregnancies   Anxiety    hx of   CAD (coronary artery disease)    LHC 6/19: pLAD, oD1 75, pRCA 20, mRCA 90 >> PCI: DES to RCA // Echo 6/19: EF 55-60, normal wall motion   Complication of anesthesia    itching relieved with benadryl w/all my surgeries (07/24/2017)   Depression    hx   Dyslipidemia    Fibroid    GERD (gastroesophageal  reflux disease)    PRN meds   Gestational diabetes    hx of   Heart murmur    hx   Heart palpitations    hx of   Hx of adenomatous polyp of colon 12/24/2002   Hyperlipidemia    on meds   Hypertension    on meds    Past Surgical History:  Procedure Laterality Date   [place] 1998; 2001   COLONOSCOPY  2017   CG-MAC-movi(good)-2 polyps-TA x 1, tics   CORONARY STENT INTERVENTION N/A 07/28/2017   Procedure: CORONARY STENT INTERVENTION;  Surgeon: KTroy Sine MD;  [place] [facility];  Service: Cardiovascular;  Laterality: N/A;  RCA   LEFT HEART CATH AND CORONARY ANGIOGRAPHY N/A 07/28/2017   Procedure: LEFT HEART CATH AND CORONARY ANGIOGRAPHY;  Surgeon: KTroy Sine MD;  [facility];  Service: Cardiovascular;  Laterality: N/A;   LEFT HEART CATH AND CORONARY ANGIOGRAPHY N/A 09/04/2017   Procedure: LEFT HEART CATH AND CORONARY ANGIOGRAPHY;  Surgeon: CSherren Mocha MD;  [facility];  Service: Cardiovascular;  Laterality: N/A;   LUMBAR LAMINECTOMY/DECOMPRESSION MICRODISCECTOMY  04/25/2011   Procedure: LUMBAR LAMINECTOMY/DECOMPRESSION MICRODISCECTOMY 1 LEVEL;  Surgeon: GElaina Hoops MD;  [facility];  Service: Neurosurgery;  Laterality: Left;  Left Lumbar Three-Four Laminectomy and Diskectomy   LUMBAR LAMINECTOMY/DECOMPRESSION MICRODISCECTOMY  04/26/2011   Procedure: LUMBAR LAMINECTOMY/DECOMPRESSION MICRODISCECTOMY 1 LEVEL;  Surgeon: Elaina Hoops, MD;  Location: [place] [facility];  Service: Neurosurgery;  Laterality: N/A;  Re-Exploration of Lumbar Wound and Evacuation of Epidural Hematoma   NOVASURE ABLATION  2006   POLYPECTOMY     TA x 1   TUBAL LIGATION  2001   WISDOM TOOTH EXTRACTION      Current Medications: Current Meds  Medication Sig   amLODipine (NORVASC) 5 MG tablet Take 1 tablet (5 mg total) by mouth daily.   aspirin 81 MG EC tablet Take 1 tablet (81 mg total) by mouth daily.    esomeprazole (NEXIUM) 20 MG capsule 1 tablet po daily OTC   Evolocumab (REPATHA SURECLICK) 938 MG/ML SOAJ Inject 1 pen every 14 days subutaneously   isosorbide mononitrate (IMDUR) 30 MG 24 hr tablet Take 1 tablet (30 mg total) by mouth daily.   metoprolol tartrate (LOPRESSOR) 50 MG tablet Take 1 tablet (50 mg total) by mouth 2 (two) times daily.   nitroGLYCERIN (NITROSTAT) 0.4 MG SL tablet Place 1 tablet (0.4 mg total) under the tongue every 5 (five) minutes as needed for chest pain.   rosuvastatin (CRESTOR) 40 MG tablet TAKE 1 TABLET(40 MG) BY MOUTH AT BEDTIME   trazodone (DESYREL) 300 MG tablet Take 0.5 tablets (150 mg total) by mouth at bedtime. Take at bedtime as needed     Allergies:   Latex, Tape, and Tapentadol   Social History   Socioeconomic History   Marital status: Married    Spouse name: Not on file   Number of children: 3   Years of education: Not on file   Highest education level: Not on file  Occupational History   Occupation: stay at home mom  Tobacco Use   Smoking status: Former    Packs/day: 0.10    Years: 2.00    Pack years: 0.20    Types: Cigarettes   Smokeless tobacco: Never   Tobacco comments:    quit smoking in college  Vaping Use   Vaping Use: Never used  Substance and Sexual Activity   Alcohol use: Yes    Alcohol/week: 7.0 - 8.0 standard drinks    Types: 7 - 8 Standard drinks or equivalent per week   Drug use: Not Currently    Types: Marijuana    Comment: 07/24/2017 "in college"   Sexual activity: Yes    Partners: Male    Birth control/protection: Surgical    Comment: 1st intercourse- 17, partners- 2, married- 39 yrs   Other Topics Concern   Not on file  Social History Narrative   Married to [place]   2 daughters and 1 son   Former Education officer, museum - homemaker now   6 drinks/week, no tobacco, no drug use      Social Determinants of Radio broadcast assistant Strain: Not on file  Food Insecurity: Not on file  Transportation Needs: Not on  file  Physical Activity: Not on file  Stress: Not on file  Social Connections: Not on file     Family History: The patient's family history includes Anesthesia problems in her mother and sister; Atrial fibrillation in her father; Colon cancer (age of onset: 3) in her sister; Colon polyps (age of onset: 39) in her sister; Colon polyps (age of onset: 30) in her mother; Coronary artery disease in her father; Diabetes in her  father; Heart disease in her father; Hyperlipidemia in her brother, father, mother, and sister; Hypertension in her father and mother; Thyroid disease in her sister. There is no history of Esophageal cancer, Stomach cancer, or Rectal cancer.  ROS:   Please see the history of present illness.     EKGs/Labs/Other Studies Reviewed:    EKG:  The ekg ordered today demonstrates normal sinus rhythm, early repolarization unchanged from prior, heart rate 71, PR interval 162 ms, QRS duration 96 ms.  Recent Labs: 05/05/2020: ALT 36; BUN 16; Creatinine, Ser 0.90; Hemoglobin 13.7; Platelets 169.0; Potassium 3.5; Sodium 139   Recent Lipid Panel Lab Results  Component Value Date/Time   CHOL 263 (H) 07/25/2017 10:51 AM   TRIG 58 07/25/2017 10:51 AM   HDL 78 07/25/2017 10:51 AM   LDLCALC 173 (H) 07/25/2017 10:51 AM   LDLDIRECT 115 (H) 09/03/2017 12:09 PM   LDLDIRECT 155.5 05/14/2011 09:24 AM    Physical Exam:    VS:  BP 112/72    Pulse 71    Ht _0  (1.676 m)    Wt 196 lb 6.4 oz (89.1 kg)    SpO2 98%    BMI 31.70 kg/m    No data found.  Wt Readings from Last 3 Encounters:  02/14/21 196 lb 6.4 oz (89.1 kg)  12/07/20 192 lb (87.1 kg)  11/23/20 192 lb (87.1 kg)     GEN:  Well nourished, well developed in no acute distress HEENT: Normal NECK: No JVD; No carotid bruits CARDIAC: RRR, no murmurs, rubs, gallops RESPIRATORY:  Clear to auscultation without rales, wheezing or rhonchi  ABDOMEN: Soft, non-tender, non-distended MUSCULOSKELETAL: No edema; No deformity  SKIN: Warm  and dry NEUROLOGIC:  Alert and oriented PSYCHIATRIC:  Normal affect     ASSESSMENT AND PLAN   Coronary artery disease with stable angina -LHC 2019 with DES to the RCA, residual 75% stenosis of a small first diagonal artery treated medically -Continue amlodipine, isosorbide nitrate and metoprolol -Continue lipid management and aspirin therapy  Neck tightness -Sounds musculoskeletal.  Reassured her I do not think it is related to her CAD.  Likely secondary to recent heavy lifting.  And will improve with time.  Obesity -We will referred her to our Pharm.D. clinic for weight management and prediabetes. -Continue weight watchers. -Refer her to our PREP exercise program.  Heterozygous familial hypercholesterolemia  -LDL 72 in October 2022. -Continue statin and PCSK9 inhibitor  Hypertension, well controlled. -Continue current medications. -Goal BP is <130/80.  Recommend DASH diet (high in vegetables, fruits, low-fat dairy products, whole grains, poultry, fish, and nuts and low in sweets, sugar-sweetened beverages, and red meats), salt restriction and increase physical activity.  PVCs Tachycardia -Continue metoprolol.  Palpitations worse in summer with hot weather. -Did not tolerate higher dose of beta-blocker due to fatigue  Disposition - Follow-up in 6 months with Dr. Sallyanne Kuster.       Medication Adjustments/Labs and Tests Ordered: Current medicines are reviewed at length with the patient today.  Concerns regarding medicines are outlined above.  Orders Placed This Encounter  Procedures   Amb Referral To Provider Referral Exercise Program (P.R.E.P)   AMB Referral to [facility] Pharm-D   EKG 12-Lead   No orders of the defined types were placed in this encounter.   Patient Instructions  Medication Instructions:  No Changes *If you need a refill on your cardiac medications before your next appointment, please call your pharmacy*   Lab Work: No Labs If you have labs (blood  work) drawn today and your tests are completely normal, you will receive your results only by: [place] (if you have MyChart) OR A paper copy in the mail If you have any lab test that is abnormal or we need to change your treatment, we will call you to review the results.   Testing/Procedures: No Testing   Follow-Up: At [facility], you and your health needs are our priority.  As part of our continuing mission to provide you with exceptional heart care, we have created designated Provider Care Teams.  These Care Teams include your primary Cardiologist (physician) and Advanced Practice Providers (APPs -  Physician Assistants and Nurse Practitioners) who all work together to provide you with the care you need, when you need it.  We recommend signing up for the patient portal called "MyChart".  Sign up information is provided on this After Visit Summary.  MyChart is used to connect with patients for Virtual Visits (Telemedicine).  Patients are able to view lab/test results, encounter notes, upcoming appointments, etc.  Non-urgent messages can be sent to your provider as well.   To learn more about what you can do with MyChart, go to [email or web address].    Your next appointment:   6 month(s)  The format for your next appointment:   In Person  Provider:   Sanda Klein, MD         Signed, Warren Lacy, PA-C  02/14/2021 10:24 AM    [place]

## 2021-02-14 ENCOUNTER — Other Ambulatory Visit: Payer: Self-pay

## 2021-02-14 ENCOUNTER — Encounter: Payer: Self-pay | Admitting: Physician Assistant

## 2021-02-14 ENCOUNTER — Ambulatory Visit: Payer: BC Managed Care – PPO | Admitting: Physician Assistant

## 2021-02-14 VITALS — BP 112/72 | HR 71 | Ht 66.0 in | Wt 196.4 lb

## 2021-02-14 DIAGNOSIS — I25118 Atherosclerotic heart disease of native coronary artery with other forms of angina pectoris: Secondary | ICD-10-CM

## 2021-02-14 DIAGNOSIS — I10 Essential (primary) hypertension: Secondary | ICD-10-CM

## 2021-02-14 DIAGNOSIS — E7801 Familial hypercholesterolemia: Secondary | ICD-10-CM

## 2021-02-14 DIAGNOSIS — E669 Obesity, unspecified: Secondary | ICD-10-CM

## 2021-02-14 DIAGNOSIS — I493 Ventricular premature depolarization: Secondary | ICD-10-CM | POA: Diagnosis not present

## 2021-02-14 MED ORDER — ISOSORBIDE MONONITRATE ER 30 MG PO TB24: 30.0000 mg | ORAL_TABLET | Freq: Every day | ORAL | 3 refills | Status: DC

## 2021-02-14 NOTE — Patient Instructions (Signed)
Medication Instructions:  °No Changes °*If you need a refill on your cardiac medications before your next appointment, please call your pharmacy* ° ° °Lab Work: °No Labs °If you have labs (blood work) drawn today and your tests are completely normal, you will receive your results only by: °MyChart Message (if you have MyChart) OR °A paper copy in the mail °If you have any lab test that is abnormal or we need to change your treatment, we will call you to review the results. ° ° °Testing/Procedures: °No Testing ° ° °Follow-Up: °At [facility], you and your health needs are our priority.  As part of our continuing mission to provide you with exceptional heart care, we have created designated Provider Care Teams.  These Care Teams include your primary Cardiologist (physician) and Advanced Practice Providers (APPs -  Physician Assistants and Nurse Practitioners) who all work together to provide you with the care you need, when you need it. ° °We recommend signing up for the patient portal called "MyChart".  Sign up information is provided on this After Visit Summary.  MyChart is used to connect with patients for Virtual Visits (Telemedicine).  Patients are able to view lab/test results, encounter notes, upcoming appointments, etc.  Non-urgent messages can be sent to your provider as well.   °To learn more about what you can do with MyChart, go to [email or web address].   ° °Your next appointment:   °6 month(s) ° °The format for your next appointment:   °In Person ° °Provider:   °Mihai Croitoru, MD   ° ° °  °

## 2021-02-15 ENCOUNTER — Telehealth: Payer: Self-pay

## 2021-02-15 NOTE — Telephone Encounter (Signed)
Called re: PREP program referral; left voicemail 

## 2021-02-15 NOTE — Telephone Encounter (Signed)
Have received return call from her and returned her call again, left voicemail

## 2021-02-16 ENCOUNTER — Telehealth: Payer: Self-pay

## 2021-02-16 NOTE — Telephone Encounter (Signed)
Returned call, she wants to attend [facility] 2/6 every M/W 2-3:15, will contact at end of month to set up assessment visit.

## 2021-02-22 ENCOUNTER — Telehealth: Payer: Self-pay | Admitting: *Deleted

## 2021-02-22 NOTE — Telephone Encounter (Signed)
A message was left, [id number] appointment with the pharmacist.

## 2021-03-02 ENCOUNTER — Telehealth: Payer: Self-pay

## 2021-03-02 NOTE — Telephone Encounter (Signed)
Called to confirm participation in next PREP Class at [facility] starting 2/6, left voicemail requesting call back to schedule assessment visit next week.

## 2021-03-05 ENCOUNTER — Telehealth: Payer: Self-pay

## 2021-03-05 NOTE — Telephone Encounter (Signed)
Returning my call, she confirms she wants to attend PREP starting 2/6 at [facility], assessment visit scheduled for 2/1 at 1:30

## 2021-03-07 NOTE — Progress Notes (Signed)
[facility] PREP Evaluation  Patient Details  Name: Nancy Sparks MRN: [id number] Date of Birth: 09/06/1965 Age: 56 y.o. PCP: Haywood Pao, MD  Vitals:   03/07/21 1454  BP: 102/64  Pulse: 85  SpO2: 97%  Weight: 196 lb 12.8 oz (89.3 kg)     [facility] Eval - 03/07/21 1400       [facility] "PREP" Location   [facility] "PREP" Location [facility]      Referral    Referring Provider J. Quentin Ore    Reason for referral Hypertension;Obesitity/Overweight    Program Start Date 03/12/21      Measurement   Waist Circumference 42 inches    Hip Circumference 46 inches    Body fat 41.7 percent      Information for Trainer   Goals --   Re-establish consistent exercise routine; lose10-15 pounds by end of 12 weeks.   Current Exercise --   walking, tennis,pickleball   Orthopedic Concerns --   L hamstring strain   Pertinent Medical History --   HTN, s/p PTCA 2019   Current Barriers --   travel   Medications that affect exercise Beta blocker      Timed Up and Go (TUGS)   Timed Up and Go Low risk <9 seconds      Mobility and Daily Activities   I find it easy to walk up or down two or more flights of stairs. 1    I have no trouble taking out the trash. 4    I do housework such as vacuuming and dusting on my own without difficulty. 4    I can easily lift a gallon of milk (8lbs). 4    I can easily walk a mile. 2    I have no trouble reaching into high cupboards or reaching down to pick up something from the floor. 3    I do not have trouble doing out-door work such as Armed forces logistics/support/administrative officer, raking leaves, or gardening. 2      Mobility and Daily Activities   I feel younger than my age. 4    I feel independent. 4    I feel energetic. 3    I live an active life.  3    I feel strong. 3    I feel healthy. 2    I feel active as other people my age. 3      How fit and strong are you.   Fit and Strong Total Score 42            Past Medical History:  Diagnosis Date   Anemia    "w/pregnancies    Anxiety    hx of   CAD (coronary artery disease)    LHC 6/19: pLAD, oD1 75, pRCA 20, mRCA 90 >> PCI: DES to RCA // Echo 6/19: EF 55-60, normal wall motion   Complication of anesthesia    itching relieved with benadryl w/all my surgeries (07/24/2017)   Depression    hx   Dyslipidemia    Fibroid    GERD (gastroesophageal reflux disease)    PRN meds   Gestational diabetes    hx of   Heart murmur    hx   Heart palpitations    hx of   Hx of adenomatous polyp of colon 12/24/2002   Hyperlipidemia    on meds   Hypertension    on meds   Past Surgical History:  Procedure Laterality Date   BACK SURGERY  [place]; 1998; 2001   COLONOSCOPY  2017   CG-MAC-movi(good)-2 polyps-TA x 1, tics   CORONARY STENT INTERVENTION N/A 07/28/2017   Procedure: CORONARY STENT INTERVENTION;  Surgeon: Troy Sine, MD;  Location: [place] [facility];  Service: Cardiovascular;  Laterality: N/A;  RCA   LEFT HEART CATH AND CORONARY ANGIOGRAPHY N/A 07/28/2017   Procedure: LEFT HEART CATH AND CORONARY ANGIOGRAPHY;  Surgeon: Troy Sine, MD;  Location: [place] [facility];  Service: Cardiovascular;  Laterality: N/A;   LEFT HEART CATH AND CORONARY ANGIOGRAPHY N/A 09/04/2017   Procedure: LEFT HEART CATH AND CORONARY ANGIOGRAPHY;  Surgeon: Sherren Mocha, MD;  Location: [place] [facility];  Service: Cardiovascular;  Laterality: N/A;   LUMBAR LAMINECTOMY/DECOMPRESSION MICRODISCECTOMY  04/25/2011   Procedure: LUMBAR LAMINECTOMY/DECOMPRESSION MICRODISCECTOMY 1 LEVEL;  Surgeon: Elaina Hoops, MD;  Location: [place] [facility];  Service: Neurosurgery;  Laterality: Left;  Left Lumbar Three-Four Laminectomy and Diskectomy   LUMBAR LAMINECTOMY/DECOMPRESSION MICRODISCECTOMY  04/26/2011   Procedure: LUMBAR LAMINECTOMY/DECOMPRESSION MICRODISCECTOMY 1 LEVEL;  Surgeon: Elaina Hoops, MD;  Location: [place] [facility];  Service: Neurosurgery;  Laterality: N/A;  Re-Exploration of Lumbar Wound and Evacuation of Epidural Hematoma    NOVASURE ABLATION  2006   POLYPECTOMY     TA x 1   TUBAL LIGATION  2001   WISDOM TOOTH EXTRACTION     Social History   Tobacco Use  Smoking Status Former   Packs/day: 0.10   Years: 2.00   Pack years: 0.20   Types: Cigarettes  Smokeless Tobacco Never  Tobacco Comments   quit smoking in college  To begin PREP classes at [facility] 2/6, every M/W 2-3:15pm   Danner Paul[place] B Jionni Helming 03/07/2021, 2:58 PM

## 2021-03-12 ENCOUNTER — Other Ambulatory Visit: Payer: Self-pay | Admitting: Internal Medicine

## 2021-03-12 DIAGNOSIS — Z1231 Encounter for screening mammogram for malignant neoplasm of breast: Secondary | ICD-10-CM

## 2021-03-12 NOTE — Progress Notes (Signed)
[facility] PREP Weekly Session  Patient Details  Name: Nancy Sparks MRN: [id number] Date of Birth: [birth year] Age: 56 y.o. PCP: Haywood Pao, MD  There were no vitals filed for this visit.   [facility] Weekly seesion - 03/12/21 1500       [facility] "PREP" Location   [facility] "PREP" Location [facility]      Weekly Session   Topic Discussed Goal setting and welcome to the program   Introductions; Review of program and handbook; tour of facility   Classes attended to date [place] 03/12/2021, 3:24 PM

## 2021-03-14 ENCOUNTER — Other Ambulatory Visit: Payer: Self-pay

## 2021-03-14 MED ORDER — METOPROLOL TARTRATE 50 MG PO TABS: 50.0000 mg | ORAL_TABLET | Freq: Two times a day (BID) | ORAL | 3 refills | Status: DC

## 2021-03-16 ENCOUNTER — Other Ambulatory Visit: Payer: Self-pay | Admitting: *Deleted

## 2021-03-16 MED ORDER — ROSUVASTATIN CALCIUM 40 MG PO TABS: ORAL_TABLET | ORAL | 3 refills | Status: DC

## 2021-03-19 NOTE — Progress Notes (Signed)
[facility] PREP Weekly Session  Patient Details  Name: Nancy Sparks MRN: [id number] Date of Birth: [birth year] Age: 56 y.o. PCP: Haywood Pao, MD  Vitals:   03/19/21 1522  Weight: 196 lb 9.6 oz (89.2 kg)     [facility] Weekly seesion - 03/19/21 1500       [facility] "PREP" Location   [facility] "PREP" Location [facility]      Weekly Session   Topic Discussed Importance of resistance training;Other ways to be active   Water: 1/2 body wt in oz or 64 oz/day (unless on fluid restriction); work up to 150 min cardio/wk, strength training 2-3 x/wk 20-40 minutes   Minutes exercised this week 145 minutes    Classes attended to date [place] 03/19/2021, 3:22 PM

## 2021-03-20 ENCOUNTER — Other Ambulatory Visit: Payer: Self-pay | Admitting: *Deleted

## 2021-03-20 MED ORDER — AMLODIPINE BESYLATE 5 MG PO TABS: 5.0000 mg | ORAL_TABLET | Freq: Every day | ORAL | 1 refills | Status: DC

## 2021-03-20 MED ORDER — ROSUVASTATIN CALCIUM 40 MG PO TABS: ORAL_TABLET | ORAL | 1 refills | Status: DC

## 2021-03-22 ENCOUNTER — Other Ambulatory Visit: Payer: Self-pay

## 2021-03-22 ENCOUNTER — Encounter: Payer: Self-pay | Admitting: Pharmacist

## 2021-03-22 ENCOUNTER — Ambulatory Visit: Payer: BC Managed Care – PPO | Admitting: Pharmacist

## 2021-03-22 VITALS — BP 108/64 | HR 70 | Resp 17 | Ht 66.0 in | Wt 197.4 lb

## 2021-03-22 DIAGNOSIS — E669 Obesity, unspecified: Secondary | ICD-10-CM

## 2021-03-22 DIAGNOSIS — Z8632 Personal history of gestational diabetes: Secondary | ICD-10-CM | POA: Diagnosis not present

## 2021-03-22 MED ORDER — METOPROLOL TARTRATE 50 MG PO TABS: 50.0000 mg | ORAL_TABLET | Freq: Two times a day (BID) | ORAL | 1 refills | Status: DC

## 2021-03-22 MED ORDER — ROSUVASTATIN CALCIUM 40 MG PO TABS: ORAL_TABLET | ORAL | 1 refills | Status: DC

## 2021-03-22 MED ORDER — ISOSORBIDE MONONITRATE ER 30 MG PO TB24: 30.0000 mg | ORAL_TABLET | Freq: Every day | ORAL | 1 refills | Status: DC

## 2021-03-22 MED ORDER — WEGOVY 0.25 MG/0.5ML ~~LOC~~ SOAJ: 0.2500 mg | SUBCUTANEOUS | 0 refills | Status: DC

## 2021-03-22 MED ORDER — AMLODIPINE BESYLATE 5 MG PO TABS: 5.0000 mg | ORAL_TABLET | Freq: Every day | ORAL | 1 refills | Status: DC

## 2021-03-22 MED ORDER — NITROGLYCERIN 0.4 MG SL SUBL: 0.4000 mg | SUBLINGUAL_TABLET | SUBLINGUAL | 3 refills | Status: DC | PRN

## 2021-03-22 NOTE — Progress Notes (Signed)
Patient ID: Nancy Sparks                 DOB: [birth year]                      MRN: [id number]     HPI: Nancy Sparks is a 56 y.o. female referred by Nancy Sparks to PharmD clinic. PMH is significant for HLD, preDM, and CAD.    Patient presents today concerned regarding struggles with weight loss.  Reports she has gained 10-12 pounds in past year despite being physically active.  Plays tennis, pickleball, and walks 1-3 miles at least 5 days a week.  Recently restarted Weight watchers on 2/7. Reports she had success with it about 20 years ago. Is familiar with food groups, micronutrients, and healthy eating. However weight has not come off yet which concerns her.    New diet plan consists of avocado, hard boiled eggs, cottage cheese, granola, greek yogurt. Weight watchers has her eating 5 small meals a day.  Husband does not follow any diet which frustrates her because therefore there are snack foods in her house she has to avoid and she also prepares meals for him.  Frequently eats out at restaurants on weekends.  Has a house in [place] which she often vacations at and while she is there she had not been following a diet and had been drinking more wine than usual.  At home will only have wine on weekends, 1-2 glasses.  Patient interested in weight loss medication to get herself started.   Wt Readings from Last 3 Encounters:  03/22/21 197 lb 6.4 oz (89.5 kg)  03/19/21 196 lb 9.6 oz (89.2 kg)  03/07/21 196 lb 12.8 oz (89.3 kg)   BP Readings from Last 3 Encounters:  03/22/21 108/64  03/07/21 102/64  02/14/21 112/72   Pulse Readings from Last 3 Encounters:  03/22/21 70  03/07/21 85  02/14/21 71    Renal function: CrCl cannot be calculated (Patient's most recent lab result is older than the maximum 21 days allowed.).  Past Medical History:  Diagnosis Date   Anemia    "w/pregnancies   Anxiety    hx of   CAD (coronary artery disease)    LHC 6/19: pLAD,  oD1 75, pRCA 20, mRCA 90 >> PCI: DES to RCA // Echo 6/19: EF 55-60, normal wall motion   Complication of anesthesia    itching relieved with benadryl w/all my surgeries (07/24/2017)   Depression    hx   Dyslipidemia    Fibroid    GERD (gastroesophageal reflux disease)    PRN meds   Gestational diabetes    hx of   Heart murmur    hx   Heart palpitations    hx of   Hx of adenomatous polyp of colon 12/24/2002   Hyperlipidemia    on meds   Hypertension    on meds    Current Outpatient Medications on File Prior to Visit  Medication Sig Dispense Refill   amLODipine (NORVASC) 5 MG tablet Take 1 tablet (5 mg total) by mouth daily. 90 tablet 1   aspirin 81 MG EC tablet Take 1 tablet (81 mg total) by mouth daily. 90 tablet 3   esomeprazole (NEXIUM) 20 MG capsule 1 tablet po daily OTC     Evolocumab (REPATHA SURECLICK) 834 MG/ML SOAJ Inject 1 pen every 14 days subutaneously 2 mL 11   isosorbide mononitrate (IMDUR) 30 MG  24 hr tablet Take 1 tablet (30 mg total) by mouth daily. 90 tablet 3   metoprolol tartrate (LOPRESSOR) 50 MG tablet Take 1 tablet (50 mg total) by mouth 2 (two) times daily. 180 tablet 3   nitroGLYCERIN (NITROSTAT) 0.4 MG SL tablet Place 1 tablet (0.4 mg total) under the tongue every 5 (five) minutes as needed for chest pain. 90 tablet 3   rosuvastatin (CRESTOR) 40 MG tablet TAKE 1 TABLET(40 MG) BY MOUTH AT BEDTIME 90 tablet 1   trazodone (DESYREL) 300 MG tablet Take 0.5 tablets (150 mg total) by mouth at bedtime. Take at bedtime as needed 45 tablet 4   No current facility-administered medications on file prior to visit.    Allergies  Allergen Reactions   Latex Rash   Tape Rash    Paper tape ok   Tapentadol Rash    Paper tape ok     Assessment/Plan:  1. Pre Diabetes/Obesity -  Patient current BMI 31.86 kg/m2 and has been steadily increasing in past year despite exercise.  Applauded patient's exercise routine and recommended continued physical activity at least 5  days a week.  Recommended she continue following weight watchers program because it has worked for her in the past and she enjoys it.  Gave planning healthy meals hand outs and went over with patient in room.  Also printed handouts on different types of fats.  Patient would like to try a weight loss medication to help her get started since she is very discouraged.  Had previously submitted PA for [facility] but had not received response yet. Gave patient sample box of 0.25mg  pens.  Using demo pen, educated patient on storage, mechanism of action, site selection, administration, and possible adverse effects. Patient is not pregnant and has no history or family history of thyroid carcinomas.  Will contact patient when PA result comes back.  Recheck in clinic in 4 weeks.  Nancy Sparks, PharmD, BCACP, [place], [place], [place], [place], [address] Phone: [phone], Fax: [phone]

## 2021-03-22 NOTE — Patient Instructions (Addendum)
It was nice meeting you today!  Continue your healthy eating with weight watchers  Continue your physical activity at least 5 days a week  Concentrate on vegetables, lean proteins, healthy fat, and water  I will start a sample of Wegovy 0.25mg  which you will inject once weekly.  If after 4 weeks you would like to move to the next dose please give me a call  Please call with any questions  Karren Cobble, PharmD, [place], [place], [place], [place], [place], [address] Phone: [phone], Fax: [phone]

## 2021-03-27 ENCOUNTER — Telehealth: Payer: Self-pay | Admitting: Pharmacist

## 2021-03-27 DIAGNOSIS — E669 Obesity, unspecified: Secondary | ICD-10-CM

## 2021-03-27 MED ORDER — WEGOVY 0.5 MG/0.5ML ~~LOC~~ SOAJ: 0.5000 mg | SUBCUTANEOUS | 0 refills | Status: DC

## 2021-03-27 NOTE — Telephone Encounter (Signed)
Patient received 0.25mg  sample box at appointment.  Rx for 0.5mg  sent to pharmacy

## 2021-03-27 NOTE — Addendum Note (Signed)
Addended by: Rollen Sox on: 03/27/2021 01:59 PM   Modules accepted: Orders

## 2021-03-27 NOTE — Telephone Encounter (Signed)
PA for [facility] approved through 07/25/21.  Called patient and left message on machine.

## 2021-03-28 NOTE — Addendum Note (Signed)
Addended by: Rollen Sox on: 03/28/2021 04:04 PM   Modules accepted: Orders

## 2021-03-28 NOTE — Telephone Encounter (Signed)
Patient called back.  Had first injection on Saturday with no adverse effects

## 2021-04-02 NOTE — Progress Notes (Signed)
[facility] PREP Weekly Session  Patient Details  Name: Nancy Sparks MRN: [id number] Date of Birth: [birth year] Age: 56 y.o. PCP: Haywood Pao, MD  Vitals:   04/02/21 1603  Weight: 195 lb 9.6 oz (88.7 kg)     [facility] Weekly seesion - 04/02/21 1600       [facility] "PREP" Location   [facility] "PREP" Location [facility]      Weekly Session   Topic Discussed Health habits   Sugar demo; limit total sugars to 24gms for women, 36 gms for men; introduced YUKA app   Minutes exercised this week 222 minutes    Classes attended to date [facility] 04/02/2021, 4:04 PM

## 2021-04-03 ENCOUNTER — Ambulatory Visit
Admission: RE | Admit: 2021-04-03 | Discharge: 2021-04-03 | Disposition: A | Discharge status: Home / Self Care | Payer: BC Managed Care – PPO | Source: Ambulatory Visit | Attending: Internal Medicine | Admitting: Internal Medicine

## 2021-04-03 DIAGNOSIS — Z1231 Encounter for screening mammogram for malignant neoplasm of breast: Secondary | ICD-10-CM

## 2021-04-09 NOTE — Progress Notes (Signed)
[facility] PREP Weekly Session ? ?Patient Details  ?Name: Nancy Sparks ?MRN: [id number] ?Date of Birth: 1965-10-19 ?Age: 56 y.o. ?PCP: Haywood Pao, MD ? ?Vitals:  ? 04/09/21 1615  ?Weight: 194 lb (88 kg)  ? ? ? [facility] Weekly seesion - 04/09/21 1600   ? ?  ? [facility] "PREP" Location  ? [facility]PREP[facility]   ?  ? Weekly Session  ? Topic Discussed Restaurant Eating   Salt demo; Na intake 1500-'2300mg'$ /day  ? Minutes exercised this week 215 minutes   ? Classes attended to date 59   ? ?  ?  ? ?  ? ? ?Zachery Dakins [name] ?04/09/2021, 4:16 PM ? ? ?

## 2021-04-11 ENCOUNTER — Other Ambulatory Visit: Payer: Self-pay

## 2021-04-11 MED ORDER — REPATHA SURECLICK 140 MG/ML ~~LOC~~ SOAJ: SUBCUTANEOUS | 11 refills | Status: DC

## 2021-04-25 ENCOUNTER — Ambulatory Visit: Payer: BC Managed Care – PPO | Admitting: Pharmacist

## 2021-04-25 ENCOUNTER — Other Ambulatory Visit: Payer: Self-pay

## 2021-04-25 VITALS — BP 120/84 | HR 73 | Resp 15 | Ht 66.25 in | Wt 189.8 lb

## 2021-04-25 DIAGNOSIS — R7301 Impaired fasting glucose: Secondary | ICD-10-CM | POA: Diagnosis not present

## 2021-04-25 DIAGNOSIS — E669 Obesity, unspecified: Secondary | ICD-10-CM | POA: Diagnosis not present

## 2021-04-25 NOTE — Progress Notes (Signed)
Patient ID: Nancy Sparks                 DOB: 1966-01-07                    MRN: [id number] ? ? ? ? ?HPI: ?Nancy Sparks is a 56 y.o. female patient referred to pharmacy clinic by Caron Presume to initiate weight loss therapy with GLP1-RA. PMH is significant for obesity complicated by chronic medical conditions including HTN and CAD.  ? ?BMI at first visit: 31.86 ?  ?BMI Today: 30.4 ? ?Currently managed on Wegovy 0.'5mg'$ . Reports no adverse effects other than occaisonal constipation.   ? ?Is disappointed because her Weight Watchers clinic is closing. She likes the camaraderie and the accountability of having to step on the scale.  Was referred to PREP classes at the [facility] which she also liked, however she thought the education was too basic for her ? ? ?Wt Readings from Last 1 Encounters:  ?04/25/21 189 lb 12.8 oz (86.1 kg)  ? ? ?BP Readings from Last 1 Encounters:  ?04/25/21 120/84  ? ?Pulse Readings from Last 1 Encounters:  ?04/25/21 73  ? ? ?   ?Component Value Date/Time  ? CHOL 263 (H) 07/25/2017 1051  ? TRIG 58 07/25/2017 1051  ? HDL 78 07/25/2017 1051  ? CHOLHDL 3.4 07/25/2017 1051  ? VLDL 12 07/25/2017 1051  ? LDLCALC 173 (H) 07/25/2017 1051  ? LDLDIRECT 115 (H) 09/03/2017 1209  ? LDLDIRECT 155.5 05/14/2011 0924  ? ? ?Past Medical History:  ?Diagnosis Date  ? Anemia   ? "w/pregnancies  ? Anxiety   ? hx of  ? CAD (coronary artery disease)   ? LHC 6/19: pLAD, oD1 75, pRCA 20, mRCA 90 >> PCI: DES to RCA // Echo 6/19: EF 55-60, normal wall motion  ? Complication of anesthesia   ? itching relieved with benadryl w/all my surgeries (07/24/2017)  ? Depression   ? hx  ? Dyslipidemia   ? Fibroid   ? GERD (gastroesophageal reflux disease)   ? PRN meds  ? Gestational diabetes   ? hx of  ? Heart murmur   ? hx  ? Heart palpitations   ? hx of  ? Hx of adenomatous polyp of colon 12/24/2002  ? Hyperlipidemia   ? on meds  ? Hypertension   ? on meds  ? ? ?Current Outpatient Medications on File Prior to Visit  ?Medication Sig  Dispense Refill  ? amLODipine (NORVASC) 5 MG tablet Take 1 tablet (5 mg total) by mouth daily. 90 tablet 1  ? aspirin 81 MG EC tablet Take 1 tablet (81 mg total) by mouth daily. 90 tablet 3  ? esomeprazole (NEXIUM) 20 MG capsule 1 tablet po daily OTC    ? Evolocumab (REPATHA SURECLICK) 956 MG/ML SOAJ Inject 1 pen every 14 days subutaneously 2 mL 11  ? isosorbide mononitrate (IMDUR) 30 MG 24 hr tablet Take 1 tablet (30 mg total) by mouth daily. 90 tablet 1  ? metoprolol tartrate (LOPRESSOR) 50 MG tablet Take 1 tablet (50 mg total) by mouth 2 (two) times daily. 180 tablet 1  ? nitroGLYCERIN (NITROSTAT) 0.4 MG SL tablet Place 1 tablet (0.4 mg total) under the tongue every 5 (five) minutes as needed for chest pain. 90 tablet 3  ? rosuvastatin (CRESTOR) 40 MG tablet TAKE 1 TABLET(40 MG) BY MOUTH AT BEDTIME 90 tablet 1  ? Semaglutide-Weight Management (WEGOVY) 0.25 MG/0.5ML SOAJ Inject 0.25 mg into the  skin once a week. 2 mL 0  ? trazodone (DESYREL) 300 MG tablet Take 0.5 tablets (150 mg total) by mouth at bedtime. Take at bedtime as needed 45 tablet 4  ? ?No current facility-administered medications on file prior to visit.  ? ? ?Allergies  ?Allergen Reactions  ? Latex Rash  ? Tape Rash  ?  Paper tape ok  ? Tapentadol Rash  ?  Paper tape ok  ? ? ? ?Assessment/Plan: ? ?1. Weight loss - Patient has lost >7 pounds since first visit and starting Wegovy. BMI has decreased from 31.86 to 30.4.  Currently on week 2 of Wegovy 0.'5mg'$ .  Requests referral to [facility] because she likes accountability with her weight and also likes learning diet and exercise tips.  Referral placed.  Patient would like to continue [facility] 0.'5mg'$  weekly at this time and will call back in 2 weeks if she would like a refill or to increase to 1.0. ? ?Continue Wegovy 0.'5mg'$  sq weekly ?Referral to [facility] ?Recheck in 4 week ? ?Karren Cobble, PharmD, BCACP, [place], CPP ?[address], [address] ?[place], [place],  [address] ?Phone: [phone], Fax: [phone]  ?

## 2021-04-25 NOTE — Patient Instructions (Addendum)
It was good seeing you again ? ?You are making good weight loss progress ? ?I have placed the referral for [facility] ? ?Continue your Wegovy 0.'5mg'$  once weekly.  Please let me know if you would a refill or a dose increase.  You can call or send a message. ? ?Please let me know if you have any questions ? ?Karren Cobble, PharmD, BCACP, [place], CPP ?[place] [place], [address] ?[place], [place], [address] ?Phone: [phone], Fax: [phone]  ? ?

## 2021-05-14 ENCOUNTER — Encounter: Payer: Self-pay | Admitting: Pharmacist

## 2021-05-14 DIAGNOSIS — E669 Obesity, unspecified: Secondary | ICD-10-CM

## 2021-05-14 DIAGNOSIS — E663 Overweight: Secondary | ICD-10-CM

## 2021-05-14 MED ORDER — WEGOVY 1 MG/0.5ML ~~LOC~~ SOAJ: 1.0000 mg | SUBCUTANEOUS | 2 refills | Status: DC

## 2021-06-06 ENCOUNTER — Ambulatory Visit: Payer: BC Managed Care – PPO

## 2021-06-10 ENCOUNTER — Encounter: Payer: Self-pay | Admitting: Pharmacist

## 2021-06-11 ENCOUNTER — Ambulatory Visit: Payer: BC Managed Care – PPO | Admitting: Obstetrics & Gynecology

## 2021-06-12 ENCOUNTER — Ambulatory Visit: Payer: BC Managed Care – PPO

## 2021-06-13 DIAGNOSIS — E7801 Familial hypercholesterolemia: Secondary | ICD-10-CM | POA: Diagnosis not present

## 2021-06-13 DIAGNOSIS — I2510 Atherosclerotic heart disease of native coronary artery without angina pectoris: Secondary | ICD-10-CM | POA: Diagnosis not present

## 2021-06-13 DIAGNOSIS — R7301 Impaired fasting glucose: Secondary | ICD-10-CM | POA: Diagnosis not present

## 2021-06-13 DIAGNOSIS — Z1339 Encounter for screening examination for other mental health and behavioral disorders: Secondary | ICD-10-CM | POA: Diagnosis not present

## 2021-06-13 DIAGNOSIS — Z1331 Encounter for screening for depression: Secondary | ICD-10-CM | POA: Diagnosis not present

## 2021-06-14 ENCOUNTER — Encounter: Payer: Self-pay | Admitting: Cardiovascular Disease

## 2021-06-14 ENCOUNTER — Ambulatory Visit: Payer: BC Managed Care – PPO | Admitting: Cardiovascular Disease

## 2021-06-14 ENCOUNTER — Ambulatory Visit (INDEPENDENT_AMBULATORY_CARE_PROVIDER_SITE_OTHER): Payer: BC Managed Care – PPO | Admitting: Pharmacist

## 2021-06-14 VITALS — BP 112/84 | HR 96 | Ht 66.0 in | Wt 181.6 lb

## 2021-06-14 DIAGNOSIS — I493 Ventricular premature depolarization: Secondary | ICD-10-CM

## 2021-06-14 DIAGNOSIS — R7301 Impaired fasting glucose: Secondary | ICD-10-CM

## 2021-06-14 DIAGNOSIS — I10 Essential (primary) hypertension: Secondary | ICD-10-CM | POA: Diagnosis not present

## 2021-06-14 DIAGNOSIS — Z8632 Personal history of gestational diabetes: Secondary | ICD-10-CM

## 2021-06-14 DIAGNOSIS — I25118 Atherosclerotic heart disease of native coronary artery with other forms of angina pectoris: Secondary | ICD-10-CM | POA: Diagnosis not present

## 2021-06-14 DIAGNOSIS — E7801 Familial hypercholesterolemia: Secondary | ICD-10-CM

## 2021-06-14 MED ORDER — METOPROLOL TARTRATE 50 MG PO TABS: 50.0000 mg | ORAL_TABLET | Freq: Two times a day (BID) | ORAL | 3 refills | Status: DC

## 2021-06-14 MED ORDER — ISOSORBIDE MONONITRATE ER 30 MG PO TB24: 30.0000 mg | ORAL_TABLET | Freq: Every day | ORAL | 3 refills | Status: DC

## 2021-06-14 MED ORDER — AMLODIPINE BESYLATE 5 MG PO TABS: 5.0000 mg | ORAL_TABLET | Freq: Every day | ORAL | 3 refills | Status: DC

## 2021-06-14 NOTE — Patient Instructions (Addendum)
It was good seeing you  ? ?I encourage you to join the gym at your condo and continue to be active ? ?We can hold the [facility] for now.  If you are interested in restarting please let us know ? ?I am encouraged with your progress so far ? ?Try to continue to limit sugars and concentrate on proteins, vegetables, and whole grains ? ?Please call with any questions ? ?Here is the contact information for [facility] ? ?[facility] ?[address] [place], [place] [address] ?[phone] ? ? ?Karren Cobble, PharmD, BCACP, [place], CPP ?[address], [address] ?[place], [place], [address] ?Phone: [phone], Fax: [phone]  ?

## 2021-06-14 NOTE — Progress Notes (Signed)
Patient ID: Nancy Sparks                 DOB: Aug 04, 1965                    MRN: [id number]     HPI: Nancy Sparks is a 56 y.o. female patient referred to pharmacy clinic by Caron Presume to initiate weight loss therapy with GLP1-RA. PMH is significant for obesity complicated by chronic medical conditions including HTN and CAD.   BMI at first visit: 31.86   BMI Today: 29.31  Had recently finished Wegovy 1.'0mg'$  weekly for 4 weeks but discontinued due to constipation.  Is heading to the beach for the summer and does not want to restart at this time.    Has not received call from [facility] yet.  Wt Readings from Last 1 Encounters:  06/14/21 181 lb 9.6 oz (82.4 kg)    BP Readings from Last 1 Encounters:  06/14/21 112/84   Pulse Readings from Last 1 Encounters:  06/14/21 96       Component Value Date/Time   CHOL 263 (H) 07/25/2017 1051   TRIG 58 07/25/2017 1051   HDL 78 07/25/2017 1051   CHOLHDL 3.4 07/25/2017 1051   VLDL 12 07/25/2017 1051   [place] 173 (H) 07/25/2017 1051   LDLDIRECT 115 (H) 09/03/2017 1209   LDLDIRECT 155.5 05/14/2011 0924    Past Medical History:  Diagnosis Date   Anemia    "w/pregnancies   Anxiety    hx of   CAD (coronary artery disease)    LHC 6/19: pLAD, oD1 75, pRCA 20, mRCA 90 >> PCI: DES to RCA // Echo 6/19: EF 55-60, normal wall motion   Complication of anesthesia    itching relieved with benadryl w/all my surgeries (07/24/2017)   Depression    hx   Dyslipidemia    Fibroid    GERD (gastroesophageal reflux disease)    PRN meds   Gestational diabetes    hx of   Heart murmur    hx   Heart palpitations    hx of   Hx of adenomatous polyp of colon 12/24/2002   Hyperlipidemia    on meds   Hypertension    on meds    Current Outpatient Medications on File Prior to Visit  Medication Sig Dispense Refill   amLODipine (NORVASC) 5 MG tablet Take 1 tablet (5 mg total) by mouth daily. 90 tablet 3   aspirin 81 MG EC  tablet Take 1 tablet (81 mg total) by mouth daily. 90 tablet 3   esomeprazole (NEXIUM) 20 MG capsule 1 tablet po daily OTC     Evolocumab (REPATHA SURECLICK) 409 MG/ML SOAJ Inject 1 pen every 14 days subutaneously 2 mL 11   isosorbide mononitrate (IMDUR) 30 MG 24 hr tablet Take 1 tablet (30 mg total) by mouth daily. 90 tablet 3   metoprolol tartrate (LOPRESSOR) 50 MG tablet Take 1 tablet (50 mg total) by mouth 2 (two) times daily. 180 tablet 3   nitroGLYCERIN (NITROSTAT) 0.4 MG SL tablet Place 1 tablet (0.4 mg total) under the tongue every 5 (five) minutes as needed for chest pain. (Patient not taking: Reported on 06/14/2021) 90 tablet 3   rosuvastatin (CRESTOR) 40 MG tablet TAKE 1 TABLET(40 MG) BY MOUTH AT BEDTIME 90 tablet 1   Semaglutide-Weight Management (WEGOVY) 1 MG/0.5ML SOAJ Inject 1 mg into the skin once a week. (Patient not taking: Reported on 06/14/2021) 2 mL  2   trazodone (DESYREL) 300 MG tablet Take 0.5 tablets (150 mg total) by mouth at bedtime. Take at bedtime as needed 45 tablet 4   No current facility-administered medications on file prior to visit.    Allergies  Allergen Reactions   Latex Rash   Tape Rash    Paper tape ok   Tapentadol Rash    Paper tape ok     Assessment/Plan:  1. Weight loss - Patient has lost 16 pounds since first visit and starting Wegovy. BMI has decreased from 31.86 to 29.31.  Unfortunately would like to discontinue due to constipation and does not want to have continued adverse effects while at the beach over the summer. Discussed other options such as decreasing dose but patient is not interested at this time.  Referral for [facility] still active. Provided her with clinic phone number.  Recommended she try to be physically active this summer while at the beach. Patient voiced understanding.  Recheck as needed.   Karren Cobble, PharmD, BCACP, [place], [place], [place], [place], [address] Phone: [phone],  Fax: [phone]

## 2021-06-14 NOTE — Patient Instructions (Signed)

## 2021-06-14 NOTE — Progress Notes (Signed)
? ?  ? ?Cardiology Office Note  ? ?Date:  06/14/2021  ? ?ID:  Nancy Sparks, DOB 1965/06/17, MRN [id number] ? ? ?PCP:  Tisovec, Fransico Him, MD  ?Cardiologist:  Sanda Klein, MD  ?Electrophysiologist:  None  ? ?Evaluation Performed:  Follow-Up Visit ? ?Chief Complaint:  CAD, HTN, [place] ? ?History of Present Illness:   ? ?Nancy Sparks is a 56 y.o. female with systemic hypertension and severe hypercholesterolemia almost certainly related to heterozygous familial hypercholesterolemia who presented with unstable angina and received a drug-eluting stent to the right coronary artery in June 2019.  She also had a 75% stenosis in a small first diagonal artery left for medical therapy.  She had recurrent problems with angina after the procedure despite having no evidence of problems on repeat angiography, suggesting she may have a component of coronary vasospasm.   ? ?Normal nuclear stress test 12/08/2018.  Event monitor showed PVCs which are mildly symptomatic and cause broad variations in the heart rate recorded by apple watch. ? ?She had a good year without any cardiovascular issues or complaints.  She is working hard on losing weight.  She tried taking Wegovy but gave up after about 6 weeks due to side effects.  She is practicing weight watchers.  She is lost 22 pounds since Christmas and a net 15 pounds over the last year.  She is no longer obese, BMI is now down to 29.  She plays pickle ball and exercises regularly.  She is planning a trip to [place] this year. ? ?The patient specifically denies any chest pain at rest exertion, dyspnea at rest or with exertion, orthopnea, paroxysmal nocturnal dyspnea, syncope, palpitations, focal neurological deficits, intermittent claudication, lower extremity edema, unexplained weight gain, cough, hemoptysis or wheezing. ? ?ECG generally shows early repolarization abnormalities. ? ? ?Past Medical History:  ?Diagnosis Date  ? Anemia   ? "w/pregnancies  ? Anxiety   ? hx of  ? CAD  (coronary artery disease)   ? LHC 6/19: pLAD, oD1 75, pRCA 20, mRCA 90 >> PCI: DES to RCA // Echo 6/19: EF 55-60, normal wall motion  ? Complication of anesthesia   ? itching relieved with benadryl w/all my surgeries (07/24/2017)  ? Depression   ? hx  ? Dyslipidemia   ? Fibroid   ? GERD (gastroesophageal reflux disease)   ? PRN meds  ? Gestational diabetes   ? hx of  ? Heart murmur   ? hx  ? Heart palpitations   ? hx of  ? Hx of adenomatous polyp of colon 12/24/2002  ? Hyperlipidemia   ? on meds  ? Hypertension   ? on meds  ? ?Past Surgical History:  ?Procedure Laterality Date  ? BACK SURGERY    ? [place]; 1998; 2001  ? COLONOSCOPY  2017  ? CG-MAC-movi(good)-2 polyps-TA x 1, tics  ? CORONARY STENT INTERVENTION N/A 07/28/2017  ? Procedure: CORONARY STENT INTERVENTION;  Surgeon: Troy Sine, MD;  Location: [facility];  Service: Cardiovascular;  Laterality: N/A;  RCA  ? LEFT HEART CATH AND CORONARY ANGIOGRAPHY N/A 07/28/2017  ? Procedure: LEFT HEART CATH AND CORONARY ANGIOGRAPHY;  Surgeon: Troy Sine, MD;  Location: [facility];  Service: Cardiovascular;  Laterality: N/A;  ? LEFT HEART CATH AND CORONARY ANGIOGRAPHY N/A 09/04/2017  ? Procedure: LEFT HEART CATH AND CORONARY ANGIOGRAPHY;  Surgeon: Sherren Mocha, MD;  Location: [facility];  Service: Cardiovascular;  Laterality: N/A;  ?  LUMBAR LAMINECTOMY/DECOMPRESSION MICRODISCECTOMY  04/25/2011  ? Procedure: LUMBAR LAMINECTOMY/DECOMPRESSION MICRODISCECTOMY 1 LEVEL;  Surgeon: Elaina Hoops, MD;  Location: [facility];  Service: Neurosurgery;  Laterality: Left;  Left Lumbar Three-Four Laminectomy and Diskectomy  ? LUMBAR LAMINECTOMY/DECOMPRESSION MICRODISCECTOMY  04/26/2011  ? Procedure: LUMBAR LAMINECTOMY/DECOMPRESSION MICRODISCECTOMY 1 LEVEL;  Surgeon: Elaina Hoops, MD;  Location: [facility];  Service: Neurosurgery;  Laterality: N/A;  Re-Exploration of Lumbar Wound and Evacuation of Epidural Hematoma  ? [place] ABLATION   2006  ? POLYPECTOMY    ? TA x 1  ? TUBAL LIGATION  2001  ? WISDOM TOOTH EXTRACTION    ?  ? ?Current Meds  ?Medication Sig  ? aspirin 81 MG EC tablet Take 1 tablet (81 mg total) by mouth daily.  ? esomeprazole (NEXIUM) 20 MG capsule 1 tablet po daily OTC  ? Evolocumab (REPATHA SURECLICK) 300 MG/ML SOAJ Inject 1 pen every 14 days subutaneously  ? rosuvastatin (CRESTOR) 40 MG tablet TAKE 1 TABLET(40 MG) BY MOUTH AT BEDTIME  ? trazodone (DESYREL) 300 MG tablet Take 0.5 tablets (150 mg total) by mouth at bedtime. Take at bedtime as needed  ? [DISCONTINUED] amLODipine (NORVASC) 5 MG tablet Take 1 tablet (5 mg total) by mouth daily.  ? [DISCONTINUED] isosorbide mononitrate (IMDUR) 30 MG 24 hr tablet Take 1 tablet (30 mg total) by mouth daily.  ? [DISCONTINUED] metoprolol tartrate (LOPRESSOR) 50 MG tablet Take 1 tablet (50 mg total) by mouth 2 (two) times daily.  ?  ? ?Allergies:   Latex, Tape, and Tapentadol  ? ?Social History  ? ?Tobacco Use  ? Smoking status: Former  ?  Packs/day: 0.10  ?  Years: 2.00  ?  Pack years: 0.20  ?  Types: Cigarettes  ? Smokeless tobacco: Never  ? Tobacco comments:  ?  quit smoking in college  ?Vaping Use  ? Vaping Use: Never used  ?Substance Use Topics  ? Alcohol use: Yes  ?  Alcohol/week: 7.0 - 8.0 standard drinks  ?  Types: 7 - 8 Standard drinks or equivalent per week  ? Drug use: Not Currently  ?  Types: Marijuana  ?  Comment: 07/24/2017 "in college"  ?  ? ?Family Hx: ?The patient's family history includes Anesthesia problems in her mother and sister; Atrial fibrillation in her father; Colon cancer (age of onset: 32) in her sister; Colon polyps (age of onset: 15) in her sister; Colon polyps (age of onset: 23) in her mother; Coronary artery disease in her father; Diabetes in her father; Heart disease in her father; Hyperlipidemia in her brother, father, mother, and sister; Hypertension in her father and mother; Thyroid disease in her sister. There is no history of Esophageal cancer, Stomach  cancer, or Rectal cancer. ? ?ROS:   ?Please see the history of present illness.    ?All other systems reviewed and are negative. ? ? ?Prior CV studies:   ?The following studies were reviewed today: ?Lexiscan Myoview 12/08/2018 ?Nuclear stress EF: 64%. The left ventricular ejection fraction is normal (55-65%). ?There was no ST segment deviation noted during stress. ?This is a low risk study. There is no evidence of ischemia ?The study is normal. ? ?Event monitor 08/24/2019 ?Dominant rhythm is normal sinus with normal circadian variation. ?There are relatively frequent premature ventricular contractions representing 2% of all QRS complexes. The vast majority of these have the same morphology. These sometimes occur in a pattern of trigeminy. Ventricular couplets are rarely seen. ?There is no evidence of ventricular  tachycardia, atrial fibrillation or meaningful bradycardia or pauses. ?The patient symptoms appear to correlate with periods of increased frequency of premature ventricular beats. ?  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 ?This explains the apparent variations in pulse rate (as PVCs may not be detected by the visual receptor of the Apple watch). ? ? ? ?Labs/Other Tests and Data Reviewed:   ? ?EKG: Ordered today and personally reviewed shows sinus rhythm, questionable left atrial abnormality, no repolarization changes, similar to previous tracings ?Recent Labs: ?01/05/2019 TSH 3.29 ?08/18/2018 creatinine 0.7, hemoglobin 12.6, hemoglobin A1c 5.2%, normal LFTs ? ?08/30/2019 ?Creatinine 0.8, hemoglobin A1c 5.6%, hemoglobin 13.3, normal liver functions ? ?05/05/2020 ?Hemoglobin 13.7, creatinine 0.9, potassium 3.5, ALT 36 ? ? ?Recent Lipid Panel ?Lab Results  ?Component Value Date/Time  ? CHOL 263 (H) 07/25/2017 10:51 AM  ? TRIG 58 07/25/2017 10:51 AM  ? HDL 78 07/25/2017 10:51 AM  ? CHOLHDL 3.4 07/25/2017 10:51 AM  ? LDLCALC 173 (H) 07/25/2017 10:51 AM  ? LDLDIRECT 115 (H) 09/03/2017 12:09 PM  ?  LDLDIRECT 155.5 05/14/2011 09:24 AM  ? ? ?08/30/2019 ?Total cholesterol 149, HDL 63, LDL 49, triglycerides 186 ? ?04/18/2020 ?Cholesterol 165 HDL 72, LDL 58, triglycerides 179 ? ?Wt Readings from Last 3 Encoun

## 2021-06-19 ENCOUNTER — Other Ambulatory Visit: Payer: Self-pay | Admitting: Obstetrics & Gynecology

## 2021-06-19 NOTE — Telephone Encounter (Signed)
Last AEX 06/07/20--scheduled for 07/30/21 (Per appt notes (pt has r/s x2) ?

## 2021-06-20 ENCOUNTER — Telehealth: Payer: Self-pay | Admitting: *Deleted

## 2021-06-20 NOTE — Telephone Encounter (Signed)
Patient called about trazodone 300 mg tablet denial. You denied Rx today with reply "Will prescribe at next visit after evaluation and counseling." ? ? Patient said "I need my trazodone" her annual exam is scheduled on 07/30/21. Patient said she canceled 06/28/21 appointment because she will be out of town. Patient said she does take daily at bedtime, she asked if you could at least give her enough until her visit in [place] would be very appreciative. Rx is pending response   ?

## 2021-06-22 MED ORDER — TRAZODONE HCL 300 MG PO TABS: 150.0000 mg | ORAL_TABLET | Freq: Every day | ORAL | 0 refills | Status: DC

## 2021-06-22 NOTE — Telephone Encounter (Signed)
Dr.Lavoie replied, "Agree with sending a prescription until appointment in June. "  Rx signed., patient aware.

## 2021-06-28 ENCOUNTER — Ambulatory Visit: Payer: BC Managed Care – PPO | Admitting: Obstetrics & Gynecology

## 2021-07-14 ENCOUNTER — Encounter: Payer: Self-pay | Admitting: Cardiovascular Disease

## 2021-07-30 ENCOUNTER — Ambulatory Visit: Payer: BC Managed Care – PPO | Admitting: Obstetrics & Gynecology

## 2021-07-31 ENCOUNTER — Telehealth: Payer: BC Managed Care – PPO | Admitting: Physician Assistant

## 2021-07-31 DIAGNOSIS — H00015 Hordeolum externum left lower eyelid: Secondary | ICD-10-CM | POA: Diagnosis not present

## 2021-07-31 MED ORDER — POLYMYXIN B-TRIMETHOPRIM 10000-0.1 UNIT/ML-% OP SOLN: OPHTHALMIC | 0 refills | Status: DC

## 2021-07-31 NOTE — Progress Notes (Signed)
I have spent 5 minutes in review of e-visit questionnaire, review and updating patient chart, medical decision making and response to patient.   Mia Milan Cody Jacklynn Dehaas, PA-C    

## 2021-08-29 ENCOUNTER — Ambulatory Visit (INDEPENDENT_AMBULATORY_CARE_PROVIDER_SITE_OTHER): Payer: BC Managed Care – PPO | Admitting: Obstetrics & Gynecology

## 2021-08-29 ENCOUNTER — Encounter: Payer: Self-pay | Admitting: Obstetrics & Gynecology

## 2021-08-29 VITALS — BP 110/70 | HR 76 | Ht 65.25 in | Wt 179.0 lb

## 2021-08-29 DIAGNOSIS — Z01419 Encounter for gynecological examination (general) (routine) without abnormal findings: Secondary | ICD-10-CM | POA: Diagnosis not present

## 2021-08-29 DIAGNOSIS — Z780 Asymptomatic menopausal state: Secondary | ICD-10-CM

## 2021-08-29 DIAGNOSIS — G4700 Insomnia, unspecified: Secondary | ICD-10-CM | POA: Diagnosis not present

## 2021-08-29 MED ORDER — TRAZODONE HCL 300 MG PO TABS: 150.0000 mg | ORAL_TABLET | Freq: Every day | ORAL | 4 refills | Status: DC

## 2021-08-29 NOTE — Progress Notes (Signed)
Nancy Sparks [birth year] [id number]   History:    56 y.o.  [id number] Married.  2 daughters, 1 son in their 4's   RP:  Established patient presenting for annual gyn exam    HPI:  Postmenopausal on no HRT with no bleeding.  Normal Pap and mammogram history.  Pap 06/2020 Neg.  Repeat Pap at 3 years. Breasts normal.  Mammo Neg 03/2021.  Colono 12/2020. Sister colon cancer survivor.  BMI 29.56.  Physically active.  Healthy nutrition.  Sleeping well with Trazodone. Primary care manages hypertension, hypercholesteremia, and GERD.  2019 cardiac cath had a stent placed/CAD.    Past medical history,surgical history, family history and social history were all reviewed and documented in the EPIC chart.  Gynecologic History No LMP recorded. Patient is postmenopausal.  Obstetric History OB History  Gravida Para Term Preterm AB Living  '4 3 3   1 3  '$ SAB IAB Ectopic Multiple Live Births  1            # Outcome Date GA Lbr Len/2nd Weight Sex Delivery Anes PTL Lv  4 SAB           3 Term           2 Term           1 Term              ROS: A ROS was performed and pertinent positives and negatives are included in the history.  GENERAL: No fevers or chills. HEENT: No change in vision, no earache, sore throat or sinus congestion. NECK: No pain or stiffness. CARDIOVASCULAR: No chest pain or pressure. No palpitations. PULMONARY: No shortness of breath, cough or wheeze. GASTROINTESTINAL: No abdominal pain, nausea, vomiting or diarrhea, melena or bright red blood per rectum. GENITOURINARY: No urinary frequency, urgency, hesitancy or dysuria. MUSCULOSKELETAL: No joint or muscle pain, no back pain, no recent trauma. DERMATOLOGIC: No rash, no itching, no lesions. ENDOCRINE: No polyuria, polydipsia, no heat or cold intolerance. No recent change in weight. HEMATOLOGICAL: No anemia or easy bruising or bleeding. NEUROLOGIC: No headache, seizures, numbness, tingling or weakness. PSYCHIATRIC: No depression, no loss of  interest in normal activity or change in sleep pattern.     Exam:   BP 110/70   Pulse 76   Ht 5' 5.25" (1.657 m)   Wt 179 lb (81.2 kg)   SpO2 98%   BMI 29.56 kg/m   Body mass index is 29.56 kg/m.  General appearance : Well developed well nourished female. No acute distress HEENT: Eyes: no retinal hemorrhage or exudates,  Neck supple, trachea midline, no carotid bruits, no thyroidmegaly Lungs: Clear to auscultation, no rhonchi or wheezes, or rib retractions  Heart: Regular rate and rhythm, no murmurs or gallops Breast:Examined in sitting and supine position were symmetrical in appearance, no palpable masses or tenderness,  no skin retraction, no nipple inversion, no nipple discharge, no skin discoloration, no axillary or supraclavicular lymphadenopathy Abdomen: no palpable masses or tenderness, no rebound or guarding Extremities: no edema or skin discoloration or tenderness  Pelvic: Vulva: Normal             Vagina: No gross lesions or discharge  Cervix: No gross lesions or discharge  Uterus  AV, normal size, shape and consistency, non-tender and mobile  Adnexa  Without masses or tenderness  Anus: Normal   Assessment/Plan:  56 y.o. female for annual exam   1. Well female exam with routine gynecological exam Postmenopausal  on no HRT with no bleeding.  Normal Pap and mammogram history.  Pap 06/2020 Neg.  Repeat Pap at 3 years. Breasts normal.  Mammo Neg 03/2021.  Colono 12/2020. Sister colon cancer survivor.  BMI 29.56.  Physically active.  Healthy nutrition.  Sleeping well with Trazodone. Primary care manages hypertension, hypercholesteremia, and GERD.  2019 cardiac cath had a stent placed/CAD.  2. Postmenopause Postmenopausal on no HRT with no bleeding.   3. Insomnia, unspecified type Sleeping well with Trazodone. No CI to continue, prescription sent to pharmacy.  Other orders - trazodone (DESYREL) 300 MG tablet; Take 0.5 tablets (150 mg total) by mouth at bedtime. Take at  bedtime as needed   Princess Bruins MD, 11:42 AM 08/29/2021

## 2021-11-07 ENCOUNTER — Encounter: Payer: Self-pay | Admitting: Pharmacist

## 2021-11-20 DIAGNOSIS — R7301 Impaired fasting glucose: Secondary | ICD-10-CM | POA: Diagnosis not present

## 2021-11-20 DIAGNOSIS — F419 Anxiety disorder, unspecified: Secondary | ICD-10-CM | POA: Diagnosis not present

## 2021-11-27 DIAGNOSIS — Z1339 Encounter for screening examination for other mental health and behavioral disorders: Secondary | ICD-10-CM | POA: Diagnosis not present

## 2021-11-27 DIAGNOSIS — Z23 Encounter for immunization: Secondary | ICD-10-CM | POA: Diagnosis not present

## 2021-11-27 DIAGNOSIS — Z0000 Encounter for general adult medical examination without abnormal findings: Secondary | ICD-10-CM | POA: Diagnosis not present

## 2021-11-27 DIAGNOSIS — Z1331 Encounter for screening for depression: Secondary | ICD-10-CM | POA: Diagnosis not present

## 2021-11-27 DIAGNOSIS — I2510 Atherosclerotic heart disease of native coronary artery without angina pectoris: Secondary | ICD-10-CM | POA: Diagnosis not present

## 2021-11-28 ENCOUNTER — Telehealth: Payer: Self-pay | Admitting: Pharmacist

## 2021-11-28 DIAGNOSIS — E669 Obesity, unspecified: Secondary | ICD-10-CM

## 2021-11-28 DIAGNOSIS — M5136 Other intervertebral disc degeneration, lumbar region: Secondary | ICD-10-CM | POA: Diagnosis not present

## 2021-11-28 DIAGNOSIS — M9905 Segmental and somatic dysfunction of pelvic region: Secondary | ICD-10-CM | POA: Diagnosis not present

## 2021-11-28 DIAGNOSIS — M9902 Segmental and somatic dysfunction of thoracic region: Secondary | ICD-10-CM | POA: Diagnosis not present

## 2021-11-28 DIAGNOSIS — M9903 Segmental and somatic dysfunction of lumbar region: Secondary | ICD-10-CM | POA: Diagnosis not present

## 2021-11-28 DIAGNOSIS — M5451 Vertebrogenic low back pain: Secondary | ICD-10-CM | POA: Diagnosis not present

## 2021-11-28 NOTE — Telephone Encounter (Signed)
PA for [facility] submitte again.  Key: [id number]

## 2021-11-29 DIAGNOSIS — M9902 Segmental and somatic dysfunction of thoracic region: Secondary | ICD-10-CM | POA: Diagnosis not present

## 2021-11-29 DIAGNOSIS — M5136 Other intervertebral disc degeneration, lumbar region: Secondary | ICD-10-CM | POA: Diagnosis not present

## 2021-11-29 DIAGNOSIS — M9905 Segmental and somatic dysfunction of pelvic region: Secondary | ICD-10-CM | POA: Diagnosis not present

## 2021-11-29 DIAGNOSIS — M9903 Segmental and somatic dysfunction of lumbar region: Secondary | ICD-10-CM | POA: Diagnosis not present

## 2021-11-29 MED ORDER — WEGOVY 1 MG/0.5ML ~~LOC~~ SOAJ: 1.0000 mg | SUBCUTANEOUS | 0 refills | Status: DC

## 2021-11-29 NOTE — Addendum Note (Signed)
Addended by: Rollen Sox on: 11/29/2021 02:53 PM   Modules accepted: Orders

## 2021-12-05 DIAGNOSIS — M9905 Segmental and somatic dysfunction of pelvic region: Secondary | ICD-10-CM | POA: Diagnosis not present

## 2021-12-05 DIAGNOSIS — M9902 Segmental and somatic dysfunction of thoracic region: Secondary | ICD-10-CM | POA: Diagnosis not present

## 2021-12-05 DIAGNOSIS — M9903 Segmental and somatic dysfunction of lumbar region: Secondary | ICD-10-CM | POA: Diagnosis not present

## 2021-12-05 DIAGNOSIS — M5136 Other intervertebral disc degeneration, lumbar region: Secondary | ICD-10-CM | POA: Diagnosis not present

## 2021-12-06 ENCOUNTER — Emergency Department (HOSPITAL_COMMUNITY)
Admission: EM | Admit: 2021-12-06 | Discharge: 2021-12-06 | Disposition: A | Discharge status: Home / Self Care | Payer: BC Managed Care – PPO | Attending: Emergency Medicine | Admitting: Emergency Medicine

## 2021-12-06 ENCOUNTER — Telehealth: Payer: Self-pay

## 2021-12-06 ENCOUNTER — Telehealth: Payer: Self-pay | Admitting: Pharmacist

## 2021-12-06 ENCOUNTER — Encounter (HOSPITAL_COMMUNITY): Payer: Self-pay

## 2021-12-06 DIAGNOSIS — L299 Pruritus, unspecified: Secondary | ICD-10-CM | POA: Diagnosis not present

## 2021-12-06 DIAGNOSIS — M545 Low back pain, unspecified: Secondary | ICD-10-CM | POA: Diagnosis not present

## 2021-12-06 DIAGNOSIS — Z7982 Long term (current) use of aspirin: Secondary | ICD-10-CM | POA: Diagnosis not present

## 2021-12-06 DIAGNOSIS — R531 Weakness: Secondary | ICD-10-CM | POA: Diagnosis not present

## 2021-12-06 DIAGNOSIS — Z91040 Latex allergy status: Secondary | ICD-10-CM | POA: Insufficient documentation

## 2021-12-06 DIAGNOSIS — M546 Pain in thoracic spine: Secondary | ICD-10-CM | POA: Insufficient documentation

## 2021-12-06 DIAGNOSIS — M5442 Lumbago with sciatica, left side: Secondary | ICD-10-CM | POA: Diagnosis not present

## 2021-12-06 DIAGNOSIS — M549 Dorsalgia, unspecified: Secondary | ICD-10-CM | POA: Diagnosis not present

## 2021-12-06 MED ORDER — METHOCARBAMOL 1000 MG PO TABS: 1000.0000 mg | ORAL_TABLET | Freq: Three times a day (TID) | ORAL | 0 refills | Status: DC | PRN

## 2021-12-06 MED ORDER — LACTATED RINGERS IV BOLUS
1000.0000 mL | Freq: Once | INTRAVENOUS | Status: AC
  Administered 2021-12-06: 1000 mL via INTRAVENOUS

## 2021-12-06 MED ORDER — NAPROXEN 375 MG PO TABS: 375.0000 mg | ORAL_TABLET | Freq: Two times a day (BID) | ORAL | 0 refills | Status: DC

## 2021-12-06 MED ORDER — METHYLPREDNISOLONE 4 MG PO TBPK: ORAL_TABLET | ORAL | 0 refills | Status: DC

## 2021-12-06 MED ORDER — KETOROLAC TROMETHAMINE 15 MG/ML IJ SOLN
15.0000 mg | Freq: Once | INTRAMUSCULAR | Status: AC
  Administered 2021-12-06: 15 mg via INTRAVENOUS
  Filled 2021-12-06: qty 1

## 2021-12-06 MED ORDER — HYDROMORPHONE HCL 1 MG/ML IJ SOLN
1.0000 mg | Freq: Once | INTRAMUSCULAR | Status: AC
  Administered 2021-12-06: 1 mg via INTRAVENOUS
  Filled 2021-12-06: qty 1

## 2021-12-06 MED ORDER — METHOCARBAMOL 1000 MG/10ML IJ SOLN
500.0000 mg | Freq: Once | INTRAVENOUS | Status: AC
  Administered 2021-12-06: 500 mg via INTRAVENOUS
  Filled 2021-12-06: qty 500

## 2021-12-06 MED ORDER — HYDROCODONE-ACETAMINOPHEN 5-325 MG PO TABS: 1.0000 | ORAL_TABLET | ORAL | 0 refills | Status: DC | PRN

## 2021-12-06 NOTE — Telephone Encounter (Signed)
Called patient to work in tomorrow morning with Dr.Xu. No answer. LMOM for her to call back for scheduling.

## 2021-12-06 NOTE — Discharge Instructions (Addendum)
Are being prescribed to NSAID called naproxen.  Do not take other NSAIDs such as Advil, Aleve, ibuprofen, Motrin.  You may still take Tylenol.  Do not take the Robaxin or hydrocodone at the same time and do not combine with alcohol and do not drive or operate heavy machinery while on these meds.  If you develop worsening, recurrent, or continued back pain, numbness or weakness in the legs, incontinence of your bowels or bladder, numbness of your buttocks, fever, abdominal pain, or any other new/concerning symptoms then return to the ER for evaluation.

## 2021-12-06 NOTE — ED Provider Notes (Signed)
[facility] DEPT Provider Note   CSN: [id number] Arrival date & time: 12/06/21  [id number]     History  Chief Complaint  Patient presents with   Back Pain    Nancy Sparks is a 56 y.o. female.  HPI 56 year old female presents with back pain.  Patient has on and off back pain since having surgery about 15 years ago.  For the past 2 weeks she has been having a flare of back pain.  Last week saw [facility] and was put on steroids though she did not take it as instructed though did feel better when she took a few doses. She went to a chiropractor yesterday. However last night she was at a [place] and between kneeling and standing up she felt a sudden worsening of her pain.  The pain is in the low back diffusely.  Sometimes seems to radiate down either leg.  She also feels some tingling in her feet.  Felt like her legs were getting give out on her during the acute episode of severe pain last night.  No incontinence or saddle anesthesia.  No fevers.  She has been given 150 mcg of fentanyl by EMS.  They report that her blood pressures have been stable.  Home Medications Prior to Admission medications   Medication Sig Start Date End Date Taking? Authorizing Provider  HYDROcodone-acetaminophen (NORCO) 5-325 MG tablet Take 1 tablet by mouth every 4 (four) hours as needed. 12/06/21  Yes Sherwood Gambler, MD  methocarbamol 1000 MG TABS Take 1,000 mg by mouth every 8 (eight) hours as needed for muscle spasms. 12/06/21  Yes Sherwood Gambler, MD  methylPREDNISolone (MEDROL DOSEPAK) 4 MG TBPK tablet Take per package instructions 12/06/21  Yes Sherwood Gambler, MD  naproxen (NAPROSYN) 375 MG tablet Take 1 tablet (375 mg total) by mouth 2 (two) times daily. 12/06/21  Yes Sherwood Gambler, MD  amLODipine (NORVASC) 5 MG tablet Take 1 tablet (5 mg total) by mouth daily. 06/14/21   Croitoru, Mihai, MD  aspirin 81 MG EC tablet Take 1 tablet (81 mg total) by mouth daily. 02/01/21    Croitoru, Mihai, MD  esomeprazole (NEXIUM) 20 MG capsule 1 tablet po daily OTC 07/15/17   [provider]  Evolocumab (REPATHA SURECLICK) 062 MG/ML SOAJ Inject 1 pen every 14 days subutaneously 04/11/21   Croitoru, Mihai, MD  isosorbide mononitrate (IMDUR) 30 MG 24 hr tablet Take 1 tablet (30 mg total) by mouth daily. 06/14/21   Croitoru, Mihai, MD  metoprolol tartrate (LOPRESSOR) 50 MG tablet Take 1 tablet (50 mg total) by mouth 2 (two) times daily. 06/14/21   Croitoru, Mihai, MD  nitroGLYCERIN (NITROSTAT) 0.4 MG SL tablet Place 1 tablet (0.4 mg total) under the tongue every 5 (five) minutes as needed for chest pain. Patient not taking: Reported on 06/14/2021 03/22/21 06/20/21  Croitoru, Mihai, MD  rosuvastatin (CRESTOR) 40 MG tablet TAKE 1 TABLET(40 MG) BY MOUTH AT BEDTIME 03/22/21   Croitoru, Mihai, MD  Semaglutide-Weight Management (WEGOVY) 1 MG/0.5ML SOAJ Inject 1 mg into the skin once a week. 11/29/21   Croitoru, Mihai, MD  trazodone (DESYREL) 300 MG tablet Take 0.5 tablets (150 mg total) by mouth at bedtime. Take at bedtime as needed 08/29/21   Princess Bruins, MD      Allergies    Latex, Tape, and Tapentadol    Review of Systems   Review of Systems  Constitutional:  Negative for fever.  Gastrointestinal:  Negative for abdominal pain.  Genitourinary:  No incontinence  Musculoskeletal:  Positive for back pain.  Neurological:  Positive for weakness and numbness (tingling).    Physical Exam Updated Vital Signs BP 100/78   Pulse 63   Temp 98.5 F (36.9 C) (Oral) Comment: Simultaneous filing. User may not have seen previous data. Comment (Src): Simultaneous filing. User may not have seen previous data.  Resp 18   Ht '5\' 5"'$  (1.651 m)   Wt 85.7 kg   SpO2 92%   BMI 31.45 kg/m  Physical Exam Vitals and nursing note reviewed.  Constitutional:      Appearance: She is well-developed.  HENT:     Head: Normocephalic and atraumatic.  Cardiovascular:     Rate and Rhythm:  Normal rate and regular rhythm.     Heart sounds: Normal heart sounds.  Pulmonary:     Effort: Pulmonary effort is normal.  Abdominal:     General: There is no distension.     Palpations: Abdomen is soft.     Tenderness: There is no abdominal tenderness.  Musculoskeletal:     Thoracic back: Tenderness present.     Lumbar back: Tenderness present.       Back:  Skin:    General: Skin is warm and dry.  Neurological:     Mental Status: She is alert.     Comments: Pain limits lower extremity strength testing, but seems intact, 5/5. Grossly normal sensation     ED Results / Procedures / Treatments   Labs (all labs ordered are listed, but only abnormal results are displayed) Labs Reviewed - No data to display  EKG None  Radiology No results found.  Procedures Procedures    Medications Ordered in ED Medications  lactated ringers bolus 1,000 mL (0 mLs Intravenous Stopped 12/06/21 1008)  ketorolac (TORADOL) 15 MG/ML injection 15 mg (15 mg Intravenous Given 12/06/21 0900)  methocarbamol (ROBAXIN) 500 mg in dextrose 5 % 50 mL IVPB (0 mg Intravenous Stopped 12/06/21 1008)  HYDROmorphone (DILAUDID) injection 1 mg (1 mg Intravenous Given 12/06/21 1047)    ED Course/ Medical Decision Making/ A&P                           Medical Decision Making Amount and/or Complexity of Data Reviewed Independent Historian: spouse and EMS External Data Reviewed: notes.  Risk Prescription drug management.   Patient presents with acute exacerbation of back pain. She was given IV toradol, Robaxin, and dilaudid. She is feeling better. She's able to get up and ambulate. Further talking with her and husband (who's now at bedside), it seems like the tingling/pain is mostly going down left side. We discussed her presentation is most consistent with sciatica. However, no signs/symptoms of cauda equina at this time with normal strength, sensation and no fever, incontinence or saddle anesthesia. We  discussed that she probably needs an outpatient MRI, but no indication for emergent MRI. She didn't take the steroids as instructed and only for 3 days last week, but it seemed to help. Thus will give a medrol dose pack. Otherwise appears stable for follow up with [facility] (who she saw last week) with treatment for pain. We discussed return precautions.         Final Clinical Impression(s) / ED Diagnoses Final diagnoses:  Acute midline low back pain with left-sided sciatica    Rx / DC Orders ED Discharge Orders          Ordered    HYDROcodone-acetaminophen ([place])  5-325 MG tablet  Every 4 hours PRN        12/06/21 1115    naproxen (NAPROSYN) 375 MG tablet  2 times daily        12/06/21 1115    methocarbamol 1000 MG TABS  Every 8 hours PRN        12/06/21 1115    methylPREDNISolone (MEDROL DOSEPAK) 4 MG TBPK tablet        12/06/21 1115              Sherwood Gambler, MD 12/06/21 1255

## 2021-12-06 NOTE — Telephone Encounter (Signed)
PA submitted for renewal Key: [id number]

## 2021-12-06 NOTE — ED Triage Notes (Signed)
Pt arrives via [facility] c/o back pain. Pt states that she has chronic issues. Exacerbated last night and this morning while kneeling at church. Pt endorses numbness in bilateral feet. Denies fall/injury.   Pt given 150 mcg Fentanyl IV enroute by EMS. Developed itching and given 50 mg Benadryl IV thereafter.

## 2021-12-10 NOTE — Telephone Encounter (Signed)
PA was canceled- does not say why

## 2021-12-11 ENCOUNTER — Ambulatory Visit (INDEPENDENT_AMBULATORY_CARE_PROVIDER_SITE_OTHER): Payer: BC Managed Care – PPO

## 2021-12-11 ENCOUNTER — Ambulatory Visit: Payer: BC Managed Care – PPO | Admitting: Orthopaedic Surgery

## 2021-12-11 ENCOUNTER — Encounter: Payer: Self-pay | Admitting: Orthopaedic Surgery

## 2021-12-11 DIAGNOSIS — G8929 Other chronic pain: Secondary | ICD-10-CM

## 2021-12-11 DIAGNOSIS — M47816 Spondylosis without myelopathy or radiculopathy, lumbar region: Secondary | ICD-10-CM | POA: Diagnosis not present

## 2021-12-11 DIAGNOSIS — M545 Low back pain, unspecified: Secondary | ICD-10-CM

## 2021-12-11 DIAGNOSIS — M4156 Other secondary scoliosis, lumbar region: Secondary | ICD-10-CM | POA: Diagnosis not present

## 2021-12-11 DIAGNOSIS — R7301 Impaired fasting glucose: Secondary | ICD-10-CM | POA: Diagnosis not present

## 2021-12-11 NOTE — Progress Notes (Signed)
Office Visit Note   Patient: Nancy Sparks           Date of Birth: [birth year]           MRN: [id number] Visit Date: 12/11/2021              Requested by: Haywood Pao, MD [address] [place],  [place] [address] PCP: Haywood Pao, MD   Assessment & Plan: Visit Diagnoses:  1. Chronic low back pain, unspecified back pain laterality, unspecified whether sciatica present     Plan: Impression is improving low back pain.  I suspect that she had a flare of lumbar facet disease and spondylosis.  Fortunately this has gotten significantly better.  I do recommend outpatient physical therapy.  NSAIDs as needed.  Follow-up as needed.  Follow-Up Instructions: No follow-ups on file.   Orders:  Orders Placed This Encounter  Procedures   XR Lumbar Spine Complete   Ambulatory referral to Physical Therapy   No orders of the defined types were placed in this encounter.     Procedures: No procedures performed   Clinical Data: No additional findings.   Subjective: Chief Complaint  Patient presents with   Lower Back - Pain    HPI Nancy Sparks is a 56 year old female here for follow-up on low back pain.  She had an episode in which she woke up in the middle night with severe back pain and required EMS and a visit to the [facility] on 12/06/2021.  She did undergo L3-4 laminectomy discectomy about 10 years ago by Dr. Saintclair Halsted.  Prior to the episode of back pain she did undergo chiropractic adjustment which did not seem to help.  In the ER she was given IV Toradol as well as fentanyl and Dilaudid and prescription for Medrol Dosepak and Robaxin.  She is currently experiencing 1 out of 10 pain and has done quite well since that initial night.  Denies any red flag symptoms.  Review of Systems  Constitutional: Negative.   HENT: Negative.    Eyes: Negative.   Respiratory: Negative.    Cardiovascular: Negative.   Endocrine: Negative.   Musculoskeletal: Negative.   Neurological:  Negative.   Hematological: Negative.   Psychiatric/Behavioral: Negative.    All other systems reviewed and are negative.    Objective: Vital Signs: There were no vitals taken for this visit.  Physical Exam Vitals and nursing note reviewed.  Constitutional:      Appearance: She is well-developed.  HENT:     Head: Atraumatic.     Nose: Nose normal.  Eyes:     Extraocular Movements: Extraocular movements intact.  Cardiovascular:     Pulses: Normal pulses.  Pulmonary:     Effort: Pulmonary effort is normal.  Abdominal:     Palpations: Abdomen is soft.  Musculoskeletal:     Cervical back: Neck supple.  Skin:    General: Skin is warm.     Capillary Refill: Capillary refill takes less than 2 seconds.  Neurological:     Mental Status: She is alert. Mental status is at baseline.  Psychiatric:        Behavior: Behavior normal.        Thought Content: Thought content normal.        Judgment: Judgment normal.     Ortho Exam Examination of the lumbar spine shows slight limitation to lateral bending and forward bending and extension.  No focal motor or sensory deficits in the lower extremities.  Normal  reflexes. Specialty Comments:  No specialty comments available.  Imaging: XR Lumbar Spine Complete  Result Date: 12/11/2021 Diffuse lumbar spondylosis and facet disease.  Mild lumbar degenerative scoliosis.  There is no instability on flexion-extension views.    PMFS History: Patient Active Problem List   Diagnosis Date Noted   Coronary artery disease of native artery of native heart with stable angina pectoris ([place]) 08/18/2020   PVC's (premature ventricular contractions) 08/18/2020   Sigmoid diverticulitis 11/26/2017   Heterozygous familial hypercholesterolemia 11/02/2017   Pain in joint of right ankle 10/15/2017   Anxiety disorder 08/12/2017   Coronary artery disease involving native coronary artery with angina pectoris ([place])    HLD (hyperlipidemia) 07/24/2017   Benign  neoplasm of colon 07/15/2017   Family history of malignant neoplasm of gastrointestinal tract 07/15/2017   Gastro-esophageal reflux disease without esophagitis 07/15/2017   History of gestational diabetes mellitus 07/15/2017   Hormone replacement therapy 07/15/2017   Impaired fasting glucose 07/15/2017   Overweight 07/15/2017   Libido, decreased 01/01/2016   Hot flashes 01/01/2016   Insomnia 01/01/2016   Essential hypertension 01/03/2015   Family history of colon cancer - sister 04/24/2014   OAB (overactive bladder) 11/04/2013   Heart palpitations    Hx of adenomatous polyp of colon 12/24/2002   Past Medical History:  Diagnosis Date   Anemia    "w/pregnancies   Anxiety    hx of   CAD (coronary artery disease)    LHC 6/19: pLAD, oD1 75, pRCA 20, mRCA 90 >> PCI: DES to RCA // Echo 6/19: EF 55-60, normal wall motion   Complication of anesthesia    itching relieved with benadryl w/all my surgeries (07/24/2017)   Depression    hx   Dyslipidemia    Fibroid    GERD (gastroesophageal reflux disease)    PRN meds   Gestational diabetes    hx of   Heart murmur    hx   Heart palpitations    hx of   Hx of adenomatous polyp of colon 12/24/2002   Hyperlipidemia    on meds   Hypertension    on meds    Family History  Problem Relation Age of Onset   Colon polyps Mother 22   Hyperlipidemia Mother    Hypertension Mother    Anesthesia problems Mother        nausea   Coronary artery disease Father        CABGx3 age 84   Hyperlipidemia Father    Atrial fibrillation Father    Hypertension Father    Heart disease Father    Diabetes Father    Colon polyps Sister 39   Hyperlipidemia Sister        X2   Thyroid disease Sister    Anesthesia problems Sister        nausea   Colon cancer Sister 21   Hyperlipidemia Brother        x2   Esophageal cancer Neg Hx    Stomach cancer Neg Hx    Rectal cancer Neg Hx     Past Surgical History:  Procedure Laterality Date   [place]; 1998; 2001   COLONOSCOPY  2017   CG-MAC-movi(good)-2 polyps-TA x 1, tics   CORONARY STENT INTERVENTION N/A 07/28/2017   Procedure: CORONARY STENT INTERVENTION;  Surgeon: Troy Sine, MD;  Location: [facility];  Service: Cardiovascular;  Laterality: N/A;  RCA   LEFT HEART CATH  AND CORONARY ANGIOGRAPHY N/A 07/28/2017   Procedure: LEFT HEART CATH AND CORONARY ANGIOGRAPHY;  Surgeon: Troy Sine, MD;  Location: [facility];  Service: Cardiovascular;  Laterality: N/A;   LEFT HEART CATH AND CORONARY ANGIOGRAPHY N/A 09/04/2017   Procedure: LEFT HEART CATH AND CORONARY ANGIOGRAPHY;  Surgeon: Sherren Mocha, MD;  Location: [facility];  Service: Cardiovascular;  Laterality: N/A;   LUMBAR LAMINECTOMY/DECOMPRESSION MICRODISCECTOMY  04/25/2011   Procedure: LUMBAR LAMINECTOMY/DECOMPRESSION MICRODISCECTOMY 1 LEVEL;  Surgeon: Elaina Hoops, MD;  Location: [facility];  Service: Neurosurgery;  Laterality: Left;  Left Lumbar Three-Four Laminectomy and Diskectomy   LUMBAR LAMINECTOMY/DECOMPRESSION MICRODISCECTOMY  04/26/2011   Procedure: LUMBAR LAMINECTOMY/DECOMPRESSION MICRODISCECTOMY 1 LEVEL;  Surgeon: Elaina Hoops, MD;  Location: [facility];  Service: Neurosurgery;  Laterality: N/A;  Re-Exploration of Lumbar Wound and Evacuation of Epidural Hematoma   NOVASURE ABLATION  2006   POLYPECTOMY     TA x 1   TUBAL LIGATION  2001   WISDOM TOOTH EXTRACTION     Social History   Occupational History   Occupation: stay at home mom  Tobacco Use   Smoking status: Former    Packs/day: 0.10    Years: 2.00    Total pack years: 0.20    Types: Cigarettes   Smokeless tobacco: Never   Tobacco comments:    quit smoking in college  Vaping Use   Vaping Use: Never used  Substance and Sexual Activity   Alcohol use: Yes    Alcohol/week: 5.0 - 6.0 standard drinks of alcohol    Types: 5 - 6 Standard drinks or equivalent per week   Drug use: Not Currently   Sexual  activity: Yes    Partners: Male    Birth control/protection: Surgical, Post-menopausal    Comment: 1st intercourse- 17, partners- 2, btl

## 2021-12-21 NOTE — Therapy (Unsigned)
OUTPATIENT PHYSICAL THERAPY THORACOLUMBAR EVALUATION   Patient Name: Nancy Sparks MRN: [id number] DOB:1965-08-22, 56 y.o., female Today's Date: 12/24/2021  END OF SESSION:  PT End of Session - 12/24/21 1511     Visit Number 1    Number of Visits 8    Date for PT Re-Evaluation 02/11/21    Authorization Type [employer]    PT Start Time 1430    PT Stop Time 1510    PT Time Calculation (min) 40 min    Activity Tolerance Patient tolerated treatment well    Behavior During Therapy WFL for tasks assessed/performed             Past Medical History:  Diagnosis Date   Anemia    "w/pregnancies   Anxiety    hx of   CAD (coronary artery disease)    LHC 6/19: pLAD, oD1 75, pRCA 20, mRCA 90 >> PCI: DES to RCA // Echo 6/19: EF 55-60, normal wall motion   Complication of anesthesia    itching relieved with benadryl w/all my surgeries (07/24/2017)   Depression    hx   Dyslipidemia    Fibroid    GERD (gastroesophageal reflux disease)    PRN meds   Gestational diabetes    hx of   Heart murmur    hx   Heart palpitations    hx of   Hx of adenomatous polyp of colon 12/24/2002   Hyperlipidemia    on meds   Hypertension    on meds   Past Surgical History:  Procedure Laterality Date   [place]; 1998; 2001   COLONOSCOPY  2017   CG-MAC-movi(good)-2 polyps-TA x 1, tics   CORONARY STENT INTERVENTION N/A 07/28/2017   Procedure: CORONARY STENT INTERVENTION;  Surgeon: Troy Sine, MD;  Location: [facility];  Service: Cardiovascular;  Laterality: N/A;  RCA   LEFT HEART CATH AND CORONARY ANGIOGRAPHY N/A 07/28/2017   Procedure: LEFT HEART CATH AND CORONARY ANGIOGRAPHY;  Surgeon: Troy Sine, MD;  Location: [facility];  Service: Cardiovascular;  Laterality: N/A;   LEFT HEART CATH AND CORONARY ANGIOGRAPHY N/A 09/04/2017   Procedure: LEFT HEART CATH AND CORONARY ANGIOGRAPHY;  Surgeon: Sherren Mocha, MD;  Location: [facility];  Service: Cardiovascular;  Laterality: N/A;   LUMBAR LAMINECTOMY/DECOMPRESSION MICRODISCECTOMY  04/25/2011   Procedure: LUMBAR LAMINECTOMY/DECOMPRESSION MICRODISCECTOMY 1 LEVEL;  Surgeon: Elaina Hoops, MD;  Location: [facility];  Service: Neurosurgery;  Laterality: Left;  Left Lumbar Three-Four Laminectomy and Diskectomy   LUMBAR LAMINECTOMY/DECOMPRESSION MICRODISCECTOMY  04/26/2011   Procedure: LUMBAR LAMINECTOMY/DECOMPRESSION MICRODISCECTOMY 1 LEVEL;  Surgeon: Elaina Hoops, MD;  Location: [facility];  Service: Neurosurgery;  Laterality: N/A;  Re-Exploration of Lumbar Wound and Evacuation of Epidural Hematoma   NOVASURE ABLATION  2006   POLYPECTOMY     TA x 1   TUBAL LIGATION  2001   WISDOM TOOTH EXTRACTION     Patient Active Problem List   Diagnosis Date Noted   Coronary artery disease of native artery of native heart with stable angina pectoris ([place]) 08/18/2020   PVC's (premature ventricular contractions) 08/18/2020   Sigmoid diverticulitis 11/26/2017   Heterozygous familial hypercholesterolemia 11/02/2017   Pain in joint of right ankle 10/15/2017   Anxiety disorder 08/12/2017   Coronary artery disease involving native coronary artery with angina pectoris ([place])    HLD (hyperlipidemia) 07/24/2017   Benign neoplasm of colon  07/15/2017   Family history of malignant neoplasm of gastrointestinal tract 07/15/2017   Gastro-esophageal reflux disease without esophagitis 07/15/2017   History of gestational diabetes mellitus 07/15/2017   Hormone replacement therapy 07/15/2017   Impaired fasting glucose 07/15/2017   Overweight 07/15/2017   Libido, decreased 01/01/2016   Hot flashes 01/01/2016   Insomnia 01/01/2016   Essential hypertension 01/03/2015   Family history of colon cancer - sister 04/24/2014   OAB (overactive bladder) 11/04/2013   Heart palpitations    Hx of adenomatous polyp of colon 12/24/2002    PCP: Haywood Pao, MD  REFERRING PROVIDER: Haywood Pao, MD  REFERRING DIAG: Chronic low back pain, unspecified back pain laterality, unspecified whether sciatica present [M54.50, G89.29]   Rationale for Evaluation and Treatment: Rehabilitation  THERAPY DIAG:  Difficulty in walking, not elsewhere classified  Other low back pain  ONSET DATE: 4-5 weeks ago.   SUBJECTIVE:                                                                                                                                                                                           SUBJECTIVE STATEMENT: Nancy Sparks is a 55 year old female presenting to PT with acute on chronic lower back pain. She states that she has had this pain on and off for 10 years. She has a history of two ruptured discs aprox ~10 years ago. The episodes since, have not been as severe, however she reports going to a chiropractor recently that resulted in going to the hospital in an ambulance due to 10 /10 pain. Pt reports intermittent N/T down bilat LE's with L<R. She states that she has good days and bad days. Currently she feels good, but would like to get back to playing pickle ball without fear.     PERTINENT HISTORY:  Depression, HTN  PAIN:  Are you having pain? Yes: NPRS scale: 1-4/10 Pain location: Mid lower back  Pain description: Tight  Aggravating factors: Walking, standing, transitions. Relieving factors: Changing position, tylenol, ice.   PRECAUTIONS: None  WEIGHT BEARING RESTRICTIONS: No  FALLS:  Has patient fallen in last 6 months? No  LIVING ENVIRONMENT: Lives with: lives with their spouse Lives in: House/apartment Stairs: Yes: Internal: 12 steps; on right going up and External: 1 steps; none Has following equipment at home: None  OCCUPATION: SAHM   PLOF: Independent  PATIENT GOALS: Pt would like to get back to her recreational activities, such as walking and playing pickle ball.   NEXT MD VISIT:   OBJECTIVE:   DIAGNOSTIC FINDINGS:  Diffuse lumbar  spondylosis and facet disease.  Mild lumbar degenerative  scoliosis.  There is no instability on flexion-extension views.   PATIENT SURVEYS:  FOTO 44.66%, 61% in 12 visits.    COGNITION: Overall cognitive status: Within functional limits for tasks assessed     SENSATION: WFL  POSTURE: No Significant postural limitations  PALPATION: Pt with tenderness to palpation at R Lat just above innominate bone  LUMBAR ROM:   AROM eval  Flexion 50%  Extension 10%   Right lateral flexion Mid thigh   Left lateral flexion Mid thigh   Right rotation 25%   Left rotation 25%    (Blank rows = not tested)  LOWER EXTREMITY ROM:     Active  Right eval Left eval  Hip flexion [facility]  Knee flexion [facility] WFL  Knee extension WFL WFL    (Blank rows = not tested)  LOWER EXTREMITY MMT:    MMT Right eval Left eval  Hip flexion 5/5 5/5  Knee flexion 5/5 5/5  Knee extension 5/5 5/5   (Blank rows = not tested)  FUNCTIONAL TESTS:  6 minute walk test: 1259f with pain in R hip going from 1-3.   GAIT: Distance walked: 50 ft Assistive device utilized: None Level of assistance: Complete Independence Comments: Normal gait pattern today.   TODAY'S TREATMENT:                                                                                                                              DATE: Creating, reviewing, and completing below HEP   PATIENT EDUCATION:  Education details: Educated pt on anatomy and physiology of current symptoms, FOTO, diagnosis, prognosis, HEP,  and POC. Person educated: Patient and Parent Education method: ECustomer service managerEducation comprehension: verbalized understanding and returned demonstration  HOME EXERCISE PROGRAM: Access Code: [email or web address]: [email or web address] Date: 12/24/2021 Prepared by: SRudi Heap Exercises - Supine Lower Trunk Rotation  - 2 x daily - 7 x weekly - 2 sets - 10 reps - Supine Posterior Pelvic Tilt  - 2 x  daily - 7 x weekly - 2 sets - 10 reps - Supine Piriformis Stretch with Foot on Ground  - 2 x daily - 7 x weekly - 2 sets - 30 hold   ASSESSMENT:  CLINICAL IMPRESSION: Patient referred to PT for LBP with sciatica. She demonstrates good functional LE ROM and strength but has significant limitations with lumbar mobility. Patient will benefit from skilled PT to address below impairments, limitations and improve overall function.  OBJECTIVE IMPAIRMENTS: decreased activity tolerance, difficulty walking, decreased balance, decreased endurance, decreased mobility, decreased ROM, decreased strength, impaired flexibility, impaired UE/LE use, postural dysfunction, and pain.  ACTIVITY LIMITATIONS: bending, lifting, carry, locomotion, cleaning, community activity, driving, and or occupation  PERSONAL FACTORS: Past experiences are also affecting patient's functional outcome.  REHAB POTENTIAL: Good  CLINICAL DECISION MAKING: Stable/uncomplicated  EVALUATION COMPLEXITY: Low    GOALS: Short term PT Goals Target date: 01/14/2022 Pt will be I and compliant with HEP. Baseline:  Goal status:  New Pt will decrease pain by 25% overall Baseline: Goal status: New  Long term PT goals Target date: 02/04/2022 Pt will improve lumbar ROM to [facility] to improve functional mobility Baseline: Goal status: New Pt will improve FOTO to at least 61% functional to show improved function Baseline: Goal status: New Pt will reduce pain to overall less than 1-2/10 with usual activity and work activity. Baseline: Goal status: New Pt will be able to ambulate community distances at least 1000 ft without onset of familiar pain. gait pattern without complaints  Baseline: Goal status: New  PLAN: PT FREQUENCY: 1-3 times per week   PT DURATION: 6-8 weeks  PLANNED INTERVENTIONS (unless contraindicated): aquatic PT, Canalith repositioning, cryotherapy, Electrical stimulation, Iontophoresis with 4 mg/ml dexamethasome, Moist  heat, traction, Ultrasound, gait training, Therapeutic exercise, balance training, neuromuscular re-education, patient/family education, prosthetic training, manual techniques, passive ROM, dry needling, taping, vasopnuematic device, vestibular, spinal manipulations, joint manipulations  PLAN FOR NEXT SESSION: Assess HEP/update PRN, continue to progress functional lumbar mobility, strengthen core, proximal hip and paraspinal muscles. Decrease patients pain and help minimize functional deficits.      Lynden Ang, PT 12/24/2021, 3:12 PM

## 2021-12-24 ENCOUNTER — Encounter: Payer: Self-pay | Admitting: Physical Therapy

## 2021-12-24 ENCOUNTER — Other Ambulatory Visit: Payer: Self-pay

## 2021-12-24 ENCOUNTER — Ambulatory Visit: Payer: BC Managed Care – PPO | Admitting: Physical Therapy

## 2021-12-24 DIAGNOSIS — M5459 Other low back pain: Secondary | ICD-10-CM

## 2021-12-24 DIAGNOSIS — R262 Difficulty in walking, not elsewhere classified: Secondary | ICD-10-CM

## 2021-12-31 ENCOUNTER — Encounter: Payer: BC Managed Care – PPO | Admitting: Rehabilitative and Restorative Service Providers"

## 2022-01-08 ENCOUNTER — Encounter: Payer: Self-pay | Admitting: Rehabilitative and Restorative Service Providers"

## 2022-01-08 ENCOUNTER — Ambulatory Visit: Payer: BC Managed Care – PPO | Admitting: Rehabilitative and Restorative Service Providers"

## 2022-01-08 DIAGNOSIS — R262 Difficulty in walking, not elsewhere classified: Secondary | ICD-10-CM | POA: Diagnosis not present

## 2022-01-08 DIAGNOSIS — M5459 Other low back pain: Secondary | ICD-10-CM | POA: Diagnosis not present

## 2022-01-08 NOTE — Therapy (Signed)
OUTPATIENT PHYSICAL THERAPY TREATMENT   Patient Name: Nancy Sparks MRN: [id number] DOB:[birth year], 56 y.o., female Today's Date: 01/08/2022  END OF SESSION:  PT End of Session - 01/08/22 1512     Visit Number 2    Number of Visits 8    Date for PT Re-Evaluation 02/11/21    Authorization Type BLUE CROSS BLUE [employer]IELD    PT Start Time 2197    PT Stop Time 1552    PT Time Calculation (min) 40 min    Activity Tolerance Patient tolerated treatment well    Behavior During Therapy WFL for tasks assessed/performed              Past Medical History:  Diagnosis Date   Anemia    "w/pregnancies   Anxiety    hx of   CAD (coronary artery disease)    LHC 6/19: pLAD, oD1 75, pRCA 20, mRCA 90 >> PCI: DES to RCA // Echo 6/19: EF 55-60, normal wall motion   Complication of anesthesia    itching relieved with benadryl w/all my surgeries (07/24/2017)   Depression    hx   Dyslipidemia    Fibroid    GERD (gastroesophageal reflux disease)    PRN meds   Gestational diabetes    hx of   Heart murmur    hx   Heart palpitations    hx of   Hx of adenomatous polyp of colon 12/24/2002   Hyperlipidemia    on meds   Hypertension    on meds   Past Surgical History:  Procedure Laterality Date   [place]; 1998; 2001   COLONOSCOPY  2017   CG-MAC-movi(good)-2 polyps-TA x 1, tics   CORONARY STENT INTERVENTION N/A 07/28/2017   Procedure: CORONARY STENT INTERVENTION;  Surgeon: Troy Sine, MD;  Location: [place] [facility];  Service: Cardiovascular;  Laterality: N/A;  RCA   LEFT HEART CATH AND CORONARY ANGIOGRAPHY N/A 07/28/2017   Procedure: LEFT HEART CATH AND CORONARY ANGIOGRAPHY;  Surgeon: Troy Sine, MD;  Location: [place] [facility];  Service: Cardiovascular;  Laterality: N/A;   LEFT HEART CATH AND CORONARY ANGIOGRAPHY N/A 09/04/2017   Procedure: LEFT HEART CATH AND CORONARY ANGIOGRAPHY;  Surgeon: Sherren Mocha, MD;  Location: [place] [facility];   Service: Cardiovascular;  Laterality: N/A;   LUMBAR LAMINECTOMY/DECOMPRESSION MICRODISCECTOMY  04/25/2011   Procedure: LUMBAR LAMINECTOMY/DECOMPRESSION MICRODISCECTOMY 1 LEVEL;  Surgeon: Elaina Hoops, MD;  Location: [place] [facility];  Service: Neurosurgery;  Laterality: Left;  Left Lumbar Three-Four Laminectomy and Diskectomy   LUMBAR LAMINECTOMY/DECOMPRESSION MICRODISCECTOMY  04/26/2011   Procedure: LUMBAR LAMINECTOMY/DECOMPRESSION MICRODISCECTOMY 1 LEVEL;  Surgeon: Elaina Hoops, MD;  Location: [place] [facility];  Service: Neurosurgery;  Laterality: N/A;  Re-Exploration of Lumbar Wound and Evacuation of Epidural Hematoma   NOVASURE ABLATION  2006   POLYPECTOMY     TA x 1   TUBAL LIGATION  2001   WISDOM TOOTH EXTRACTION     Patient Active Problem List   Diagnosis Date Noted   Coronary artery disease of native artery of native heart with stable angina pectoris ([place]) 08/18/2020   PVC's (premature ventricular contractions) 08/18/2020   Sigmoid diverticulitis 11/26/2017   Heterozygous familial hypercholesterolemia 11/02/2017   Pain in joint of right ankle 10/15/2017   Anxiety disorder 08/12/2017   Coronary artery disease involving native coronary artery with angina pectoris ([place])    HLD (hyperlipidemia) 07/24/2017   Benign neoplasm of colon  07/15/2017   Family history of malignant neoplasm of gastrointestinal tract 07/15/2017   Gastro-esophageal reflux disease without esophagitis 07/15/2017   History of gestational diabetes mellitus 07/15/2017   Hormone replacement therapy 07/15/2017   Impaired fasting glucose 07/15/2017   Overweight 07/15/2017   Libido, decreased 01/01/2016   Hot flashes 01/01/2016   Insomnia 01/01/2016   Essential hypertension 01/03/2015   Family history of colon cancer - sister 04/24/2014   OAB (overactive bladder) 11/04/2013   Heart palpitations    Hx of adenomatous polyp of colon 12/24/2002    PCP: Haywood Pao, MD  REFERRING PROVIDER: Haywood Pao,  MD  REFERRING DIAG: Chronic low back pain, unspecified back pain laterality, unspecified whether sciatica present [M54.50, G89.29]   Rationale for Evaluation and Treatment: Rehabilitation  THERAPY DIAG:  Difficulty in walking, not elsewhere classified  Other low back pain  ONSET DATE: 4-5 weeks ago.   SUBJECTIVE:                                                                                                                                                                                           SUBJECTIVE STATEMENT: Pt indicated feeling better since first visit.  Pt indicated walking 3 miles and felt pretty good.  Pt indicated having some pain Sunday.    PERTINENT HISTORY:  Depression, HTN  PAIN:   NPRS scale: at worst on Sunday 6/10 brief Pain location: Mid lower back  Pain description: Tight  Aggravating factors: sitting with cross Rt leg (about 10 mins) Relieving factors: Changing position, tylenol, ice.   PRECAUTIONS: None  WEIGHT BEARING RESTRICTIONS: No  FALLS:  Has patient fallen in last 6 months? No  LIVING ENVIRONMENT: Lives with: lives with their spouse Lives in: House/apartment Stairs: Yes: Internal: 12 steps; on right going up and External: 1 steps; none Has following equipment at home: None  OCCUPATION: SAHM   PLOF: Independent  PATIENT GOALS: Pt would like to get back to her recreational activities, such as walking and playing pickle ball.   NEXT MD VISIT:   OBJECTIVE:   DIAGNOSTIC FINDINGS:  12/24/2021 Diffuse lumbar spondylosis and facet disease.  Mild lumbar degenerative  scoliosis.  There is no instability on flexion-extension views.   PATIENT SURVEYS:  12/24/2021 FOTO 44.66%, 61% in 12 visits.    COGNITION: 12/24/2021 Overall cognitive status: Within functional limits for tasks assessed     SENSATION: 12/24/2021 [facility]  POSTURE:  11/20/2023No Significant postural limitations  PALPATION: 12/24/2021 Pt with tenderness to  palpation at Rt Lat just above innominate bone  LUMBAR ROM:   AROM 12/24/2021 01/08/2022  Flexion 50% To mid shin without  complaints in back  Extension 10%  50% WFL  Right lateral flexion Mid thigh    Left lateral flexion Mid thigh    Right rotation 25%    Left rotation 25%     (Blank rows = not tested)  LOWER EXTREMITY ROM:     Active  Right 12/24/2021 Left 12/24/2021  Hip flexion [facility]  Knee flexion [facility] WFL  Knee extension WFL WFL    (Blank rows = not tested)  LOWER EXTREMITY MMT:    MMT Right 12/24/2021 Left 12/24/2021  Hip flexion 5/5 5/5  Knee flexion 5/5 5/5  Knee extension 5/5 5/5   (Blank rows = not tested)  FUNCTIONAL TESTS:  12/24/2021 6 minute walk test: 1217f with pain in Rt hip going from 1-3.   GAIT: 12/24/2021 Distance walked: 50 ft Assistive device utilized: None Level of assistance: Complete Independence Comments: Normal gait pattern today.    TODAY'S TREATMENT:                                                                                                                             DATE:  01/08/2022 Manual: Palpation and compression to Rt lumbar inferior paraspinals  Trigger Point Dry-Needling  Treatment instructions: Expect mild to moderate muscle soreness. S/S of pneumothorax if dry needled over a lung field, and to seek immediate medical attention should they occur. Patient verbalized understanding of these instructions and education.  Patient Consent Given: Yes Education handout provided: Yes Muscles treated: Rt inferior lumbar paraspinals Treatment response/outcome: Rt inferior lumbar paraspinals/multifidi   Therex: Standing lumbar extension x 5 AROM Prone opposite arm/leg lifting off table 3 sec hold x 10 each c moist heat to lumbar Supine lumbar trunk rotation stretch 15 sec x 3 bilateral Supine bridge 2-3 sec hold x 15 Seated multifidi UE press to underside of table 5 sec hold x 10 Nustep Lvl 6 10 mins  UE/LE      TODAY'S TREATMENT:                                                                                                                             DATE:  12/24/2021  Creating, reviewing, and completing below HEP   PATIENT EDUCATION:  12/24/2021 Education details: HEP update , dry needling handout Person educated: Patient Education method: Explanation and Demonstration Education comprehension: verbalized understanding and returned demonstration  HOME EXERCISE PROGRAM: Access Code: [email or web address]: [email or web address] Date:  01/08/2022 Prepared by: Scot Jun  Exercises - Supine Lower Trunk Rotation  - 2 x daily - 7 x weekly - 1 sets - 5 reps - 15 hold - Supine Posterior Pelvic Tilt  - 2 x daily - 7 x weekly - 2 sets - 10 reps - Supine Piriformis Stretch with Foot on Ground  - 2 x daily - 7 x weekly - 2 sets - 30 hold - Standing Lumbar Extension with Counter  - 3-5 x daily - 7 x weekly - 1 sets - 5-10 reps - Prone Alternating Arm and Leg Lifts  - 1 x daily - 7 x weekly - 1-2 sets - 10 reps - 3 hold - Seated Multifidi Isometric  - 2-3 x daily - 7 x weekly - 1 sets - 10 reps - 5-10 hold   ASSESSMENT:  CLINICAL IMPRESSION: Concordant symptoms noted from Rt inferior lumbar multifidi/paraspinals.   Positive improvement in bridge movement and lumbar mobility symptoms after manual/dry needling.  Continued skilled PT services indicated at this time.   OBJECTIVE IMPAIRMENTS: decreased activity tolerance, difficulty walking, decreased balance, decreased endurance, decreased mobility, decreased ROM, decreased strength, impaired flexibility, impaired UE/LE use, postural dysfunction, and pain.  ACTIVITY LIMITATIONS: bending, lifting, carry, locomotion, cleaning, community activity, driving, and or occupation  PERSONAL FACTORS: Past experiences are also affecting patient's functional outcome.  REHAB POTENTIAL: Good  CLINICAL DECISION MAKING:  Stable/uncomplicated  EVALUATION COMPLEXITY: Low    GOALS: Short term PT Goals Target date: 01/14/2022 Pt will be I and compliant with HEP. Baseline:  Goal status: on going 01/08/2022 Pt will decrease pain by 25% overall Baseline: Goal status: on going 01/08/2022  Long term PT goals Target date: 02/04/2022 Pt will improve lumbar ROM to [facility] to improve functional mobility Baseline: Goal status: New Pt will improve FOTO to at least 61% functional to show improved function Baseline: Goal status: New Pt will reduce pain to overall less than 1-2/10 with usual activity and work activity. Baseline: Goal status: New Pt will be able to ambulate community distances at least 1000 ft without onset of familiar pain. gait pattern without complaints  Baseline: Goal status: New  PLAN: PT FREQUENCY: 1-3 times per week   PT DURATION: 6-8 weeks  PLANNED INTERVENTIONS (unless contraindicated): aquatic PT, Canalith repositioning, cryotherapy, Electrical stimulation, Iontophoresis with 4 mg/ml dexamethasome, Moist heat, traction, Ultrasound, gait training, Therapeutic exercise, balance training, neuromuscular re-education, patient/family education, prosthetic training, manual techniques, passive ROM, dry needling, taping, vasopnuematic device, vestibular, spinal manipulations, joint manipulations  PLAN FOR NEXT SESSION: DN as desired.  Core strengthening.     Scot Jun, PT, DPT, OCS, ATC 01/08/22  3:55 PM

## 2022-01-15 ENCOUNTER — Encounter: Payer: BC Managed Care – PPO | Admitting: Physical Therapy

## 2022-01-22 ENCOUNTER — Encounter: Payer: BC Managed Care – PPO | Admitting: Rehabilitative and Restorative Service Providers"

## 2022-01-22 DIAGNOSIS — R7301 Impaired fasting glucose: Secondary | ICD-10-CM | POA: Diagnosis not present

## 2022-02-04 DIAGNOSIS — J3489 Other specified disorders of nose and nasal sinuses: Secondary | ICD-10-CM | POA: Diagnosis not present

## 2022-02-04 DIAGNOSIS — J329 Chronic sinusitis, unspecified: Secondary | ICD-10-CM | POA: Diagnosis not present

## 2022-02-04 DIAGNOSIS — R0981 Nasal congestion: Secondary | ICD-10-CM | POA: Diagnosis not present

## 2022-02-04 DIAGNOSIS — R051 Acute cough: Secondary | ICD-10-CM | POA: Diagnosis not present

## 2022-02-06 ENCOUNTER — Encounter: Payer: Self-pay | Admitting: Rehabilitative and Restorative Service Providers"

## 2022-02-06 ENCOUNTER — Ambulatory Visit (INDEPENDENT_AMBULATORY_CARE_PROVIDER_SITE_OTHER): Payer: BC Managed Care – PPO | Admitting: Rehabilitative and Restorative Service Providers"

## 2022-02-06 DIAGNOSIS — M5459 Other low back pain: Secondary | ICD-10-CM

## 2022-02-06 DIAGNOSIS — R262 Difficulty in walking, not elsewhere classified: Secondary | ICD-10-CM | POA: Diagnosis not present

## 2022-02-06 NOTE — Therapy (Addendum)
OUTPATIENT PHYSICAL THERAPY TREATMENT /DISCHARGE   Patient Name: Nancy Sparks MRN: [id number] DOB:1965-10-31, 57 y.o., female Today's Date: 02/06/2022  END OF SESSION:  PT End of Session - 02/06/22 1537     Visit Number 3    Number of Visits 8    Date for PT Re-Evaluation 02/11/21    Authorization Type BLUE CROSS BLUE [employer]IELD    PT Start Time 3474    PT Stop Time 1601    PT Time Calculation (min) 24 min    Activity Tolerance Patient tolerated treatment well    Behavior During Therapy WFL for tasks assessed/performed               Past Medical History:  Diagnosis Date   Anemia    "w/pregnancies   Anxiety    hx of   CAD (coronary artery disease)    LHC 6/19: pLAD, oD1 75, pRCA 20, mRCA 90 >> PCI: DES to RCA // Echo 6/19: EF 55-60, normal wall motion   Complication of anesthesia    itching relieved with benadryl w/all my surgeries (07/24/2017)   Depression    hx   Dyslipidemia    Fibroid    GERD (gastroesophageal reflux disease)    PRN meds   Gestational diabetes    hx of   Heart murmur    hx   Heart palpitations    hx of   Hx of adenomatous polyp of colon 12/24/2002   Hyperlipidemia    on meds   Hypertension    on meds   Past Surgical History:  Procedure Laterality Date   [place]; 1998; 2001   COLONOSCOPY  2017   CG-MAC-movi(good)-2 polyps-TA x 1, tics   CORONARY STENT INTERVENTION N/A 07/28/2017   Procedure: CORONARY STENT INTERVENTION;  Surgeon: Troy Sine, MD;  Location: [place] [facility];  Service: Cardiovascular;  Laterality: N/A;  RCA   LEFT HEART CATH AND CORONARY ANGIOGRAPHY N/A 07/28/2017   Procedure: LEFT HEART CATH AND CORONARY ANGIOGRAPHY;  Surgeon: Troy Sine, MD;  Location: [place] [facility];  Service: Cardiovascular;  Laterality: N/A;   LEFT HEART CATH AND CORONARY ANGIOGRAPHY N/A 09/04/2017   Procedure: LEFT HEART CATH AND CORONARY ANGIOGRAPHY;  Surgeon: Sherren Mocha, MD;  Location: [place] [facility];  Service: Cardiovascular;  Laterality: N/A;   LUMBAR LAMINECTOMY/DECOMPRESSION MICRODISCECTOMY  04/25/2011   Procedure: LUMBAR LAMINECTOMY/DECOMPRESSION MICRODISCECTOMY 1 LEVEL;  Surgeon: Elaina Hoops, MD;  Location: [place] [facility];  Service: Neurosurgery;  Laterality: Left;  Left Lumbar Three-Four Laminectomy and Diskectomy   LUMBAR LAMINECTOMY/DECOMPRESSION MICRODISCECTOMY  04/26/2011   Procedure: LUMBAR LAMINECTOMY/DECOMPRESSION MICRODISCECTOMY 1 LEVEL;  Surgeon: Elaina Hoops, MD;  Location: [place] [facility];  Service: Neurosurgery;  Laterality: N/A;  Re-Exploration of Lumbar Wound and Evacuation of Epidural Hematoma   NOVASURE ABLATION  2006   POLYPECTOMY     TA x 1   TUBAL LIGATION  2001   WISDOM TOOTH EXTRACTION     Patient Active Problem List   Diagnosis Date Noted   Coronary artery disease of native artery of native heart with stable angina pectoris ([place]) 08/18/2020   PVC's (premature ventricular contractions) 08/18/2020   Sigmoid diverticulitis 11/26/2017   Heterozygous familial hypercholesterolemia 11/02/2017   Pain in joint of right ankle 10/15/2017   Anxiety disorder 08/12/2017   Coronary artery disease involving native coronary artery with angina pectoris ([place])    HLD (hyperlipidemia) 07/24/2017   Benign neoplasm  of colon 07/15/2017   Family history of malignant neoplasm of gastrointestinal tract 07/15/2017   Gastro-esophageal reflux disease without esophagitis 07/15/2017   History of gestational diabetes mellitus 07/15/2017   Hormone replacement therapy 07/15/2017   Impaired fasting glucose 07/15/2017   Overweight 07/15/2017   Libido, decreased 01/01/2016   Hot flashes 01/01/2016   Insomnia 01/01/2016   Essential hypertension 01/03/2015   Family history of colon cancer - sister 04/24/2014   OAB (overactive bladder) 11/04/2013   Heart palpitations    Hx of adenomatous polyp of colon 12/24/2002    PCP: Haywood Pao, MD  REFERRING PROVIDER: Haywood Pao, MD  REFERRING DIAG: Chronic low back pain, unspecified back pain laterality, unspecified whether sciatica present [M54.50, G89.29]   Rationale for Evaluation and Treatment: Rehabilitation  THERAPY DIAG:  Difficulty in walking, not elsewhere classified  Other low back pain  ONSET DATE: 4-5 weeks ago.   SUBJECTIVE:                                                                                                                                                                                           SUBJECTIVE STATEMENT: Pt indicated doing pretty good overall. Pt indicated about 90 % better overall.  Pt indicated she was going to try pickle ball   PERTINENT HISTORY:  Depression, HTN  PAIN:   NPRS scale: 3/10 Pain location: Mid lower back  Pain description: Tight  Aggravating factors: sitting with cross Rt leg (about 10 mins) Relieving factors: Changing position, tylenol, ice.   PRECAUTIONS: None  WEIGHT BEARING RESTRICTIONS: No  FALLS:  Has patient fallen in last 6 months? No  LIVING ENVIRONMENT: Lives with: lives with their spouse Lives in: House/apartment Stairs: Yes: Internal: 12 steps; on right going up and External: 1 steps; none Has following equipment at home: None  OCCUPATION: SAHM   PLOF: Independent  PATIENT GOALS: Pt would like to get back to her recreational activities, such as walking and playing pickle ball.   NEXT MD VISIT:   OBJECTIVE:   DIAGNOSTIC FINDINGS:  12/24/2021 Diffuse lumbar spondylosis and facet disease.  Mild lumbar degenerative  scoliosis.  There is no instability on flexion-extension views.   PATIENT SURVEYS:  02/06/2022:  FOTO update: 70  12/24/2021 FOTO 44.66%, 61% in 12 visits.    COGNITION: 12/24/2021 Overall cognitive status: Within functional limits for tasks assessed     SENSATION: 12/24/2021 [facility]  POSTURE:  11/20/2023No Significant postural limitations  PALPATION: 12/24/2021 Pt with tenderness to  palpation at Rt Lat just above innominate bone  LUMBAR ROM:   AROM 12/24/2021 01/08/2022 02/06/2022  Flexion 50% To mid  shin without complaints in back To mid shin without complaints  Extension 10%  50% WFL 100 % WFL  Right lateral flexion Mid thigh   To knee joint  Left lateral flexion Mid thigh   To knee joint  Right rotation 25%     Left rotation 25%      (Blank rows = not tested)  LOWER EXTREMITY ROM:     Active  Right 12/24/2021 Left 12/24/2021  Hip flexion [facility]  Knee flexion [facility] WFL  Knee extension WFL WFL    (Blank rows = not tested)  LOWER EXTREMITY MMT:    MMT Right 12/24/2021 Left 12/24/2021  Hip flexion 5/5 5/5  Knee flexion 5/5 5/5  Knee extension 5/5 5/5   (Blank rows = not tested)  FUNCTIONAL TESTS:  12/24/2021 6 minute walk test: 1270f with pain in Rt hip going from 1-3.   GAIT: 12/24/2021 Distance walked: 50 ft Assistive device utilized: None Level of assistance: Complete Independence Comments: Normal gait pattern today.    TODAY'S TREATMENT:                                                                                                                             DATE:  02/06/2022 Manual: Palpation and compression to Rt lumbar inferior paraspinals  Trigger Point Dry-Needling  Treatment instructions: Expect mild to moderate muscle soreness. S/S of pneumothorax if dry needled over a lung field, and to seek immediate medical attention should they occur. Patient verbalized understanding of these instructions and education.  Patient Consent Given: Yes Education handout provided: Yes Muscles treated: Rt inferior lumbar paraspinals Treatment response/outcome: Rt inferior lumbar paraspinals/multifidi   Therex: Standing lumbar extension x 1 AROM Prone opposite arm/leg lifting off table 3 sec hold x 10 each  Supine lumbar trunk rotation stretch 15 sec x 5 bilateral Supine bridge 2-3 sec hold x 15    TODAY'S TREATMENT:                                                                                                                              DATE:  01/08/2022 Manual: Palpation and compression to Rt lumbar inferior paraspinals  Trigger Point Dry-Needling  Treatment instructions: Expect mild to moderate muscle soreness. S/S of pneumothorax if dry needled over a lung field, and to seek immediate medical attention should they occur. Patient verbalized understanding of these instructions and education.  Patient Consent Given:  Yes Education handout provided: Yes Muscles treated: Rt inferior lumbar paraspinals Treatment response/outcome: Rt inferior lumbar paraspinals/multifidi   Therex: Standing lumbar extension x 5 AROM Prone opposite arm/leg lifting off table 3 sec hold x 10 each c moist heat to lumbar Supine lumbar trunk rotation stretch 15 sec x 3 bilateral Supine bridge 2-3 sec hold x 15 Seated multifidi UE press to underside of table 5 sec hold x 10 Nustep Lvl 6 10 mins UE/LE     PATIENT EDUCATION:  12/24/2021 Education details: HEP update , dry needling handout Person educated: Patient Education method: Explanation and Demonstration Education comprehension: verbalized understanding and returned demonstration  HOME EXERCISE PROGRAM: Access Code: [email or web address] URL: [email or web address] Date: 01/08/2022 Prepared by: Scot Jun  Exercises - Supine Lower Trunk Rotation  - 2 x daily - 7 x weekly - 1 sets - 5 reps - 15 hold - Supine Posterior Pelvic Tilt  - 2 x daily - 7 x weekly - 2 sets - 10 reps - Supine Piriformis Stretch with Foot on Ground  - 2 x daily - 7 x weekly - 2 sets - 30 hold - Standing Lumbar Extension with Counter  - 3-5 x daily - 7 x weekly - 1 sets - 5-10 reps - Prone Alternating Arm and Leg Lifts  - 1 x daily - 7 x weekly - 1-2 sets - 10 reps - 3 hold - Seated Multifidi Isometric  - 2-3 x daily - 7 x weekly - 1 sets - 10 reps - 5-10 hold   ASSESSMENT:  CLINICAL IMPRESSION: Pt has  attended 3 visits overall reporting good improvement (90%).  Pt to return to some exercise activity and will reassess response of symptoms.  If symptoms do well, expect discharge. If not, continue treatment to improve.   OBJECTIVE IMPAIRMENTS: decreased activity tolerance, difficulty walking, decreased balance, decreased endurance, decreased mobility, decreased ROM, decreased strength, impaired flexibility, impaired UE/LE use, postural dysfunction, and pain.  ACTIVITY LIMITATIONS: bending, lifting, carry, locomotion, cleaning, community activity, driving, and or occupation  PERSONAL FACTORS: Past experiences are also affecting patient's functional outcome.  REHAB POTENTIAL: Good  CLINICAL DECISION MAKING: Stable/uncomplicated  EVALUATION COMPLEXITY: Low    GOALS: Short term PT Goals Target date: 01/14/2022 Pt will be I and compliant with HEP. Baseline:  Goal status: met Pt will decrease pain by 25% overall Baseline: Goal status: partially met  Long term PT goals Target date: 02/04/2022 Pt will improve lumbar ROM to [facility] to improve functional mobility Baseline: Goal status: Met 02/06/2022 Pt will improve FOTO to at least 61% functional to show improved function Baseline: Goal status: Met 02/06/2022 Pt will reduce pain to overall less than 1-2/10 with usual activity and work activity. Baseline: Goal status: on going 02/06/2022 Pt will be able to ambulate community distances at least 1000 ft without onset of familiar pain. gait pattern without complaints  Baseline: Goal status: Met 02/06/2022  PLAN: PT FREQUENCY: 1-3 times per week   PT DURATION: 6-8 weeks  PLANNED INTERVENTIONS (unless contraindicated): aquatic PT, Canalith repositioning, cryotherapy, Electrical stimulation, Iontophoresis with 4 mg/ml dexamethasome, Moist heat, traction, Ultrasound, gait training, Therapeutic exercise, balance training, neuromuscular re-education, patient/family education, prosthetic training, manual  techniques, passive ROM, dry needling, taping, vasopnuematic device, vestibular, spinal manipulations, joint manipulations  PLAN FOR NEXT SESSION: trial HEP.  If returning, recert.  Discharge after 30 days inactivity.     Scot Jun, PT, DPT, OCS, ATC 02/06/22  4:06 PM  PHYSICAL THERAPY DISCHARGE SUMMARY  Visits from Start of  Care: 3  Current functional level related to goals / functional outcomes: See note   Remaining deficits: See note   Education / Equipment: HEP  Patient goals were  mostly met . Patient is being discharged due to not returning since the last visit.  Scot Jun, PT, DPT, OCS, ATC 03/12/22  8:18 AM

## 2022-03-20 ENCOUNTER — Other Ambulatory Visit: Payer: Self-pay | Admitting: Physician Assistant

## 2022-04-26 ENCOUNTER — Other Ambulatory Visit: Payer: Self-pay | Admitting: Pharmacist

## 2022-04-26 DIAGNOSIS — E7801 Familial hypercholesterolemia: Secondary | ICD-10-CM

## 2022-04-26 DIAGNOSIS — I25118 Atherosclerotic heart disease of native coronary artery with other forms of angina pectoris: Secondary | ICD-10-CM

## 2022-04-26 MED ORDER — REPATHA SURECLICK 140 MG/ML ~~LOC~~ SOAJ: SUBCUTANEOUS | 3 refills | Status: DC

## 2022-05-07 ENCOUNTER — Telehealth: Payer: Self-pay | Admitting: Cardiovascular Disease

## 2022-05-07 NOTE — Telephone Encounter (Signed)
*  STAT* If patient is at the pharmacy, call can be transferred to refill team.   1. Which medications need to be refilled? (please list name of each medication and dose if known)   rosuvastatin (CRESTOR) 40 MG tablet    2. Which pharmacy/location (including street and city if local pharmacy) is medication to be sent to? [place], [place]   3. Do they need a 30 day or 90 day supply? 90 day

## 2022-05-09 ENCOUNTER — Other Ambulatory Visit: Payer: Self-pay

## 2022-05-09 MED ORDER — ROSUVASTATIN CALCIUM 40 MG PO TABS: ORAL_TABLET | ORAL | 3 refills | Status: DC

## 2022-05-29 DIAGNOSIS — E7801 Familial hypercholesterolemia: Secondary | ICD-10-CM | POA: Diagnosis not present

## 2022-05-29 DIAGNOSIS — D126 Benign neoplasm of colon, unspecified: Secondary | ICD-10-CM | POA: Diagnosis not present

## 2022-05-29 DIAGNOSIS — I10 Essential (primary) hypertension: Secondary | ICD-10-CM | POA: Diagnosis not present

## 2022-05-29 DIAGNOSIS — I209 Angina pectoris, unspecified: Secondary | ICD-10-CM | POA: Diagnosis not present

## 2022-05-29 DIAGNOSIS — I2510 Atherosclerotic heart disease of native coronary artery without angina pectoris: Secondary | ICD-10-CM | POA: Diagnosis not present

## 2022-06-10 ENCOUNTER — Encounter: Payer: Self-pay | Admitting: Cardiovascular Disease

## 2022-06-12 ENCOUNTER — Telehealth: Payer: Self-pay | Admitting: Cardiovascular Disease

## 2022-06-12 ENCOUNTER — Other Ambulatory Visit (HOSPITAL_COMMUNITY): Payer: Self-pay

## 2022-06-12 ENCOUNTER — Telehealth: Payer: Self-pay

## 2022-06-12 NOTE — Telephone Encounter (Signed)
PA submitted, please see separate encounter with updates on determination

## 2022-06-12 NOTE — Telephone Encounter (Signed)
Patient is aware prior Nancy Sparks has been approved

## 2022-06-12 NOTE — Telephone Encounter (Signed)
Pharmacy Patient Advocate Encounter   Received notification from [facility] that prior authorization for REPATHA is needed.    PA submitted on 06/12/22 Key [id number] Status is pending  Haze Rushing, CPhT Pharmacy Patient Advocate Specialist Direct Number: [phone] Fax: [phone]

## 2022-06-12 NOTE — Telephone Encounter (Signed)
Caller stated she is following up on patient's prior authorization for Evolocumab (REPATHA SURECLICK) 140 MG/ML SOAJ.

## 2022-06-12 NOTE — Telephone Encounter (Signed)
Patient is aware PA was submitted and we do not have a decision as of yet

## 2022-06-12 NOTE — Telephone Encounter (Signed)
Pharmacy Patient Advocate Encounter  Prior Authorization for REPATHA has been approved.    Effective dates: 06/12/22 through 06/12/23  Haze Rushing, CPhT Pharmacy Patient Advocate Specialist Direct Number: [phone] Fax: [phone]

## 2022-06-13 NOTE — Telephone Encounter (Signed)
Pt already made aware PA was approved.

## 2022-06-24 ENCOUNTER — Other Ambulatory Visit: Payer: Self-pay | Admitting: *Deleted

## 2022-06-24 MED ORDER — METOPROLOL TARTRATE 50 MG PO TABS: 50.0000 mg | ORAL_TABLET | Freq: Two times a day (BID) | ORAL | 1 refills | Status: DC

## 2022-06-26 ENCOUNTER — Other Ambulatory Visit: Payer: Self-pay

## 2022-06-26 MED ORDER — METOPROLOL TARTRATE 50 MG PO TABS: 50.0000 mg | ORAL_TABLET | Freq: Two times a day (BID) | ORAL | 1 refills | Status: DC

## 2022-07-23 ENCOUNTER — Other Ambulatory Visit: Payer: Self-pay | Admitting: Obstetrics & Gynecology

## 2022-07-23 ENCOUNTER — Other Ambulatory Visit: Payer: Self-pay

## 2022-07-23 ENCOUNTER — Encounter: Payer: Self-pay | Admitting: Cardiovascular Disease

## 2022-07-23 DIAGNOSIS — Z1231 Encounter for screening mammogram for malignant neoplasm of breast: Secondary | ICD-10-CM

## 2022-07-23 DIAGNOSIS — G4700 Insomnia, unspecified: Secondary | ICD-10-CM

## 2022-07-23 MED ORDER — ISOSORBIDE MONONITRATE ER 30 MG PO TB24: 30.0000 mg | ORAL_TABLET | Freq: Every day | ORAL | 0 refills | Status: DC

## 2022-07-23 MED ORDER — AMLODIPINE BESYLATE 5 MG PO TABS: 5.0000 mg | ORAL_TABLET | Freq: Every day | ORAL | 0 refills | Status: DC

## 2022-07-23 NOTE — Telephone Encounter (Signed)
Pt Nancy Sparks in triage line stating that she doesn't need refill at this time but couldn't get appt for AEX until 09/2022 and wants to make sure she has refills available for her trazodone rx prior to Dr. Seymour Bars leaving the practice since she will need refill prior to appt.   Last AEX 08/29/2021--scheduled for 09/25/2022 w/ JC.  Rx pend.

## 2022-07-24 MED ORDER — TRAZODONE HCL 300 MG PO TABS: 150.0000 mg | ORAL_TABLET | Freq: Every day | ORAL | 0 refills | Status: DC

## 2022-07-30 ENCOUNTER — Other Ambulatory Visit: Payer: Self-pay

## 2022-07-30 NOTE — Telephone Encounter (Signed)
Prior authorization request received for trazadone. Sent to plan today.  Key: [id number] Waiting on response. Patient is aware.

## 2022-07-31 ENCOUNTER — Other Ambulatory Visit: Payer: Self-pay

## 2022-07-31 MED ORDER — TRAZODONE HCL 150 MG PO TABS: 150.0000 mg | ORAL_TABLET | Freq: Every day | ORAL | 1 refills | Status: DC

## 2022-07-31 NOTE — Telephone Encounter (Signed)
[facility] sent a message saying that patients insurance will not cover the trazadone 300mg  where she takes 1/2 tablet at bedtime as needed but that it will cover trazadone 150mg  take 1 po as needed. Please approve rx if appropriate. Aex was 08-29-21 Next aex is 09-25-22

## 2022-08-01 NOTE — Telephone Encounter (Signed)
Pharmacy sent a fax saying that insurance will not approve 300mg  of trazodone but will for 150mg . Provider sent rx for the 150mg  & patient is aware.

## 2022-08-02 NOTE — Telephone Encounter (Signed)
Prior authorization was denied for 300mg  of trazadone but 150mg  was sent in to pharmacy

## 2022-08-04 ENCOUNTER — Other Ambulatory Visit: Payer: Self-pay | Admitting: Cardiovascular Disease

## 2022-08-13 ENCOUNTER — Ambulatory Visit
Admission: RE | Admit: 2022-08-13 | Discharge: 2022-08-13 | Disposition: A | Discharge status: Home / Self Care | Payer: BC Managed Care – PPO | Source: Ambulatory Visit | Attending: Obstetrics & Gynecology | Admitting: Obstetrics & Gynecology

## 2022-08-13 DIAGNOSIS — Z1231 Encounter for screening mammogram for malignant neoplasm of breast: Secondary | ICD-10-CM

## 2022-09-16 ENCOUNTER — Other Ambulatory Visit: Payer: Self-pay | Admitting: Cardiovascular Disease

## 2022-09-25 ENCOUNTER — Ambulatory Visit (INDEPENDENT_AMBULATORY_CARE_PROVIDER_SITE_OTHER): Payer: BC Managed Care – PPO | Admitting: Radiology

## 2022-09-25 ENCOUNTER — Encounter: Payer: Self-pay | Admitting: Radiology

## 2022-09-25 ENCOUNTER — Other Ambulatory Visit: Payer: Self-pay | Admitting: Radiology

## 2022-09-25 VITALS — BP 124/86 | Ht 65.0 in | Wt 190.0 lb

## 2022-09-25 DIAGNOSIS — E6609 Other obesity due to excess calories: Secondary | ICD-10-CM

## 2022-09-25 DIAGNOSIS — Z01419 Encounter for gynecological examination (general) (routine) without abnormal findings: Secondary | ICD-10-CM | POA: Diagnosis not present

## 2022-09-25 DIAGNOSIS — R0602 Shortness of breath: Secondary | ICD-10-CM

## 2022-09-25 DIAGNOSIS — G4700 Insomnia, unspecified: Secondary | ICD-10-CM

## 2022-09-25 DIAGNOSIS — E7801 Familial hypercholesterolemia: Secondary | ICD-10-CM

## 2022-09-25 DIAGNOSIS — Z6831 Body mass index (BMI) 31.0-31.9, adult: Secondary | ICD-10-CM

## 2022-09-25 MED ORDER — TRAZODONE HCL 150 MG PO TABS: 150.0000 mg | ORAL_TABLET | Freq: Every day | ORAL | 1 refills | Status: DC

## 2022-09-25 NOTE — Progress Notes (Signed)
   Nancy Sparks February 27, 1965 [id number]   History: Postmenopausal 57 y.o. presents for annual exam. Requests fasting labs, lab error at PCP, they lost her sample. Had A1c checked at PCP with fingerstick. Increased SOB, has a heart stent, has appt with cardiology but not until next month. No gyn concerns.   Gynecologic History Postmenopausal Last Pap: 2022. Results were: normal Last mammogram: 2024. Results were: normal Last colonoscopy: 2022   Obstetric History OB History  Gravida Para Term Preterm AB Living  4 3 3   1 3   SAB IAB Ectopic Multiple Live Births  1            # Outcome Date GA Lbr Len/2nd Weight Sex Type Anes PTL Lv  4 SAB           3 Term           2 Term           1 Term              The following portions of the patient's history were reviewed and updated as appropriate: allergies, current medications, past family history, past medical history, past social history, past surgical history, and problem list.  Review of Systems Pertinent items noted in HPI and remainder of comprehensive ROS otherwise negative.  Past medical history, past surgical history, family history and social history were all reviewed and documented in the EPIC chart.  Exam:  Vitals:   09/25/22 1046  BP: 124/86  Weight: 190 lb (86.2 kg)  Height: 5\' 5"  (1.651 m)   Body mass index is 31.62 kg/m.  General appearance:  Normal Thyroid:  Symmetrical, normal in size, without palpable masses or nodularity. Respiratory  Auscultation:  Clear without wheezing or rhonchi Cardiovascular  Auscultation:  Regular rate, without rubs, murmurs or gallops  Edema/varicosities:  Not grossly evident Abdominal  Soft,nontender, without masses, guarding or rebound.  Liver/spleen:  No organomegaly noted  Hernia:  None appreciated  Skin  Inspection:  Grossly normal Breasts: Examined lying and sitting.   Right: Without masses, retractions, nipple discharge or axillary adenopathy.   Left: Without  masses, retractions, nipple discharge or axillary adenopathy. Genitourinary   Inguinal/mons:  Normal without inguinal adenopathy  External genitalia:  Normal appearing vulva with no masses, tenderness, or lesions  BUS/Urethra/Skene's glands:  Normal  Vagina:  Normal appearing with normal color and discharge, no lesions. Atrophy: mild   Cervix:  Normal appearing without discharge or lesions  Uterus:  Normal in size, shape and contour.  Midline and mobile, nontender  Adnexa/parametria:     Rt: Normal in size, without masses or tenderness.   Lt: Normal in size, without masses or tenderness.  Anus and perineum: Normal    Raynelle Fanning, CMA present for exam  Assessment/Plan:   1. Well woman exam with routine gynecological exam Pap 2025 Recommend dermatology skin check  2. Class 1 obesity due to excess calories with serious comorbidity and body mass index (BMI) of 31.0 to 31.9 in adult Weight loss recommended  3. Familial hypercholesterolemia - CBC - Comp Met (CMET) - Lipid Profile  4. SOB (shortness of breath) Has appt with cardiology next month- recommend to call to get in sooner    Discussed SBE, colonoscopy and DEXA screening as directed. Recommend of exercise weekly, including weight bearing exercise. Encouraged the use of seatbelts and sunscreen.  Return in 1 year for annual or sooner prn.  Arlie Solomons B WHNP-BC, 11:10 AM 09/25/2022

## 2022-09-26 LAB — LIPID PANEL
Cholesterol: 218 mg/dL — ABNORMAL HIGH (ref <200)
HDL: 64 mg/dL (ref >=50)
LDL Cholesterol (Calc): 127 mg/dL — ABNORMAL HIGH
Non-HDL Cholesterol (Calc): 154 mg/dL — ABNORMAL HIGH (ref <130)
Total CHOL/HDL Ratio: 3.4 (calc) (ref <5.0)
Triglycerides: 159 mg/dL — ABNORMAL HIGH (ref <150)

## 2022-09-26 LAB — COMPREHENSIVE METABOLIC PANEL
AG Ratio: 1.9 (calc) (ref 1.0–2.5)
ALT: 35 U/L — ABNORMAL HIGH (ref 6–29)
AST: 27 U/L (ref 10–35)
Albumin: 4.8 g/dL (ref 3.6–5.1)
Alkaline phosphatase (APISO): 88 U/L (ref 37–153)
BUN: 13 mg/dL (ref 7–25)
CO2: 25 mmol/L (ref 20–32)
Calcium: 9.9 mg/dL (ref 8.6–10.4)
Chloride: 103 mmol/L (ref 98–110)
Creat: 0.74 mg/dL (ref 0.50–1.03)
Globulin: 2.5 g/dL (ref 1.9–3.7)
Glucose, Bld: 113 mg/dL — ABNORMAL HIGH (ref 65–99)
Potassium: 4.7 mmol/L (ref 3.5–5.3)
Sodium: 136 mmol/L (ref 135–146)
Total Bilirubin: 0.4 mg/dL (ref 0.2–1.2)
Total Protein: 7.3 g/dL (ref 6.1–8.1)

## 2022-09-26 LAB — CBC
HCT: 39.3 % (ref 35.0–45.0)
Hemoglobin: 13 g/dL (ref 11.7–15.5)
MCH: 29.7 pg (ref 27.0–33.0)
MCHC: 33.1 g/dL (ref 32.0–36.0)
MCV: 89.7 fL (ref 80.0–100.0)
MPV: 10.5 fL (ref 7.5–12.5)
Platelets: 218 10*3/uL (ref 140–400)
RBC: 4.38 10*6/uL (ref 3.80–5.10)
RDW: 13.2 % (ref 11.0–15.0)
WBC: 5.6 10*3/uL (ref 3.8–10.8)

## 2022-10-09 ENCOUNTER — Encounter: Payer: Self-pay | Admitting: Cardiovascular Disease

## 2022-10-09 ENCOUNTER — Ambulatory Visit: Payer: BC Managed Care – PPO | Attending: Cardiovascular Disease | Admitting: Cardiovascular Disease

## 2022-10-09 VITALS — BP 118/86 | HR 82 | Ht 66.0 in | Wt 193.4 lb

## 2022-10-09 DIAGNOSIS — I25118 Atherosclerotic heart disease of native coronary artery with other forms of angina pectoris: Secondary | ICD-10-CM

## 2022-10-09 DIAGNOSIS — I10 Essential (primary) hypertension: Secondary | ICD-10-CM | POA: Diagnosis not present

## 2022-10-09 DIAGNOSIS — R079 Chest pain, unspecified: Secondary | ICD-10-CM | POA: Diagnosis not present

## 2022-10-09 DIAGNOSIS — E7801 Familial hypercholesterolemia: Secondary | ICD-10-CM | POA: Diagnosis not present

## 2022-10-09 DIAGNOSIS — I493 Ventricular premature depolarization: Secondary | ICD-10-CM

## 2022-10-09 NOTE — Progress Notes (Signed)
Cardiology Office Note   Date:  10/13/2022   ID:  Nancy Sparks, DOB [birth year], MRN [id number]   PCP:  Gaspar Garbe, MDCardiologist:  Thurmon Fair, MD  Electrophysiologist:  None   Evaluation Performed:  Follow-Up Visit  Chief Complaint:  CAD, HTN, HFH  History of Present Illness:    Nancy Sparks is a 57 y.o. female with systemic hypertension and severe hypercholesterolemia almost certainly related to heterozygous familial hypercholesterolemia who presented with unstable angina and received a drug-eluting stent to the right coronary artery in June 2019.  She also had a 75% stenosis in a small first diagonal artery left for medical therapy.  She had recurrent problems with angina after the procedure despite having no evidence of problems on repeat angiography, suggesting she may have a component of coronary vasospasm.    After a long period of time for her chest complaints she began experiencing some shortness of breath and chest tightness climbing stairs in the last roughly 3 months.  This is not consistent.  Sometimes she feels short of breath at rest, without provocation.  She has not had activity limiting symptoms.  She had a normal nuclear perfusion pattern on a SPECT study 12/08/2018.  She does have PVCs which are mildly symptomatic (probably cause her sensation of brief shortness of breath at rest), documented on event monitor.  She is trying to stay physically active.  BMI is now 31, mildly obese range.  She is no longer taking any GLP-1 agonist due to side effects.  Glycemic control is fair with a hemoglobin A1c most recently 5.8%.  Her most recent LDL cholesterol was 127, which is outside typical target range but is a marked improvement compared to her baseline untreated LDL cholesterol 217.  He is taking both Repatha and rosuvastatin.  ECG shows normal sinus rhythm and borderline early repull changes.   Past Medical History:  Diagnosis Date   Anemia     "w/pregnancies   Anxiety    hx of   CAD (coronary artery disease)    LHC 6/19: pLAD, oD1 75, pRCA 20, mRCA 90 >> PCI: DES to RCA // Echo 6/19: EF 55-60, normal wall motion   Complication of anesthesia    itching relieved with benadryl w/all my surgeries (07/24/2017)   Depression    hx   Dyslipidemia    Fibroid    GERD (gastroesophageal reflux disease)    PRN meds   Gestational diabetes    hx of   Heart murmur    hx   Heart palpitations    hx of   Hx of adenomatous polyp of colon 12/24/2002   Hyperlipidemia    on meds   Hypertension    on meds   Past Surgical History:  Procedure Laterality Date   BACK SURGERY     CESAREAN SECTION  1996; 1998; 2001   COLONOSCOPY  2017   CG-MAC-movi(good)-2 polyps-TA x 1, tics   CORONARY STENT INTERVENTION N/A 07/28/2017   Procedure: CORONARY STENT INTERVENTION;  Surgeon: Lennette Bihari, MD;  Location: [facility] INVASIVE CV LAB;  Service: Cardiovascular;  Laterality: N/A;  RCA   LEFT HEART CATH AND CORONARY ANGIOGRAPHY N/A 07/28/2017   Procedure: LEFT HEART CATH AND CORONARY ANGIOGRAPHY;  Surgeon: Lennette Bihari, MD;  Location: [facility] INVASIVE CV LAB;  Service: Cardiovascular;  Laterality: N/A;   LEFT HEART CATH AND CORONARY ANGIOGRAPHY N/A 09/04/2017   Procedure: LEFT HEART CATH AND CORONARY ANGIOGRAPHY;  Surgeon:  Tonny Bollman, MD;  Location: [facility] INVASIVE CV LAB;  Service: Cardiovascular;  Laterality: N/A;   LUMBAR LAMINECTOMY/DECOMPRESSION MICRODISCECTOMY  04/25/2011   Procedure: LUMBAR LAMINECTOMY/DECOMPRESSION MICRODISCECTOMY 1 LEVEL;  Surgeon: Mariam Dollar, MD;  Location: [facility] NEURO ORS;  Service: Neurosurgery;  Laterality: Left;  Left Lumbar Three-Four Laminectomy and Diskectomy   LUMBAR LAMINECTOMY/DECOMPRESSION MICRODISCECTOMY  04/26/2011   Procedure: LUMBAR LAMINECTOMY/DECOMPRESSION MICRODISCECTOMY 1 LEVEL;  Surgeon: Mariam Dollar, MD;  Location: [facility] NEURO ORS;  Service: Neurosurgery;  Laterality: N/A;  Re-Exploration of Lumbar Wound and Evacuation of  Epidural Hematoma   NOVASURE ABLATION  2006   POLYPECTOMY     TA x 1   TUBAL LIGATION  2001   WISDOM TOOTH EXTRACTION       Current Meds  Medication Sig   amLODipine (NORVASC) 5 MG tablet Take 1 tablet (5 mg total) by mouth daily. Please keep September appointment for further refills   aspirin 81 MG EC tablet Take 1 tablet (81 mg total) by mouth daily.   esomeprazole (NEXIUM) 20 MG capsule 20 mg as needed.   Evolocumab (REPATHA SURECLICK) 140 MG/ML SOAJ Inject 1 pen every 14 days subutaneously   isosorbide mononitrate (IMDUR) 30 MG 24 hr tablet Take 1 tablet (30 mg total) by mouth daily. Please keep September appointment for further refills   metoprolol tartrate (LOPRESSOR) 50 MG tablet Take 1 tablet (50 mg total) by mouth 2 (two) times daily.   rosuvastatin (CRESTOR) 40 MG tablet TAKE 1 TABLET(40 MG) BY MOUTH AT BEDTIME   traZODone (DESYREL) 150 MG tablet Take 1 tablet (150 mg total) by mouth at bedtime.     Allergies:   Latex, Tape, and Tapentadol   Social History   Tobacco Use   Smoking status: Former    Current packs/day: 0.10    Average packs/day: 0.1 packs/day for 2.0 years (0.2 ttl pk-yrs)    Types: Cigarettes   Smokeless tobacco: Never   Tobacco comments:    quit smoking in college  Vaping Use   Vaping status: Never Used  Substance Use Topics   Alcohol use: Yes    Alcohol/week: 5.0 - 6.0 standard drinks of alcohol    Types: 5 - 6 Standard drinks or equivalent per week   Drug use: Not Currently     Family Hx: The patient's family history includes Anesthesia problems in her mother and sister; Atrial fibrillation in her father; Colon cancer (age of onset: 70) in her sister; Colon polyps (age of onset: 4) in her sister; Colon polyps (age of onset: 39) in her mother; Coronary artery disease in her father; Diabetes in her father; Heart disease in her father; Hyperlipidemia in her brother, father, mother, and sister; Hypertension in her father and mother; Other in her  sister; Thyroid disease in her sister. There is no history of Esophageal cancer, Stomach cancer, or Rectal cancer.  ROS:   Please see the history of present illness.    All other systems reviewed and are negative.   Prior CV studies:   The following studies were reviewed today: Lexiscan Myoview 12/08/2018 Nuclear stress EF: 64%. The left ventricular ejection fraction is normal (55-65%). There was no ST segment deviation noted during stress. This is a low risk study. There is no evidence of ischemia The study is normal.  Event monitor 08/24/2019 Dominant rhythm is normal sinus with normal circadian variation. There are relatively frequent premature ventricular contractions representing 2% of all QRS complexes. The vast majority of these have the same morphology.  These sometimes occur in a pattern of trigeminy. Ventricular couplets are rarely seen. There is no evidence of ventricular tachycardia, atrial fibrillation or meaningful bradycardia or pauses. The patient symptoms appear to correlate with periods of increased frequency of premature ventricular beats.   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 This explains the apparent variations in pulse rate (as PVCs may not be detected by the visual receptor of the Apple watch).    Labs/Other Tests and Data Reviewed:    EKG: Ordered today and shows normal sinus rhythm, borderline changes of early repolarization. Recent Labs: 01/05/2019 TSH 3.29 08/18/2018 creatinine 0.7, hemoglobin 12.6, hemoglobin A1c 5.2%, normal LFTs  08/30/2019 Creatinine 0.8, hemoglobin A1c 5.6%, hemoglobin 13.3, normal liver functions  05/05/2020 Hemoglobin 13.7, creatinine 0.9, potassium 3.5, ALT 36   Recent Lipid Panel Lab Results  Component Value Date/Time   CHOL 218 (H) 09/25/2022 11:15 AM   TRIG 159 (H) 09/25/2022 11:15 AM   HDL 64 09/25/2022 11:15 AM   CHOLHDL 3.4 09/25/2022 11:15 AM   LDLCALC 127 (H) 09/25/2022 11:15 AM   LDLDIRECT  115 (H) 09/03/2017 12:09 PM   LDLDIRECT 155.5 05/14/2011 09:24 AM    08/30/2019 Total cholesterol 149, HDL 63, LDL 49, triglycerides 186  04/18/2020 Cholesterol 165 HDL 72, LDL 58, triglycerides 179  Wt Readings from Last 3 Encounters:  10/09/22 193 lb 6.4 oz (87.7 kg)  09/25/22 190 lb (86.2 kg)  12/06/21 189 lb (85.7 kg)     Objective:    Vital Signs:  BP 118/86 (BP Location: Left Arm, Patient Position: Sitting, Cuff Size: Large)   Pulse 82   Ht 5\' 6"  (1.676 m)   Wt 193 lb 6.4 oz (87.7 kg)   SpO2 94%   BMI 31.22 kg/m     General: Alert, oriented x3, no distress, mildly obese Head: no evidence of trauma, PERRL, EOMI, no exophtalmos or lid lag, no myxedema, no xanthelasma; normal ears, nose and oropharynx Neck: normal jugular venous pulsations and no hepatojugular reflux; brisk carotid pulses without delay and no carotid bruits Chest: clear to auscultation, no signs of consolidation by percussion or palpation, normal fremitus, symmetrical and full respiratory excursions Cardiovascular: normal position and quality of the apical impulse, regular rhythm, normal first and second heart sounds, no murmurs, rubs or gallops Abdomen: no tenderness or distention, no masses by palpation, no abnormal pulsatility or arterial bruits, normal bowel sounds, no hepatosplenomegaly Extremities: no clubbing, cyanosis or edema; 2+ radial, ulnar and brachial pulses bilaterally; 2+ right femoral, posterior tibial and dorsalis pedis pulses; 2+ left femoral, posterior tibial and dorsalis pedis pulses; no subclavian or femoral bruits Neurological: grossly nonfocal Psych: Normal mood and affect  ASSESSMENT & PLAN:    1. Coronary artery disease of native artery of native heart with stable angina pectoris (HCC)   2. Chest pain of uncertain etiology   3. Heterozygous familial hypercholesterolemia   4. Essential hypertension   5. PVC's (premature ventricular contractions)      CAD: Symptoms have been  well-controlled on a combination of metoprolol, amlodipine, long-acting nitrates, but now have recurred.  Will schedule her for a PET scan. HFH: Despite rosuvastatin and Repatha, her LDL cholesterol still elevated.  When she next has a lab draw, need to check LP(a), but she is already on a PCSK9 inhibitor. HTN: Well-controlled. PVCs: Minimally symptomatic.  Was intolerant of higher doses of metoprolol for fatigue.   Patient Instructions  Medication Instructions:  No changes *If you need a refill on your cardiac  medications before your next appointment, please call your pharmacy*  Testing/Procedures: How to Prepare for Your Cardiac PET/CT Stress Test:  1. Please do not take these medications before your test:   Medications that may interfere with the cardiac pharmacological stress agent (ex. nitrates - including erectile dysfunction medications, isosorbide mononitrate, tamulosin or beta-blockers) the day of the exam. (Erectile dysfunction medication should be held for at least 72 hrs prior to test) Theophylline containing medications for 12 hours. Dipyridamole 48 hours prior to the test. Your remaining medications may be taken with water.  2. Nothing to eat or drink, except water, 3 hours prior to arrival time.   NO caffeine/decaffeinated products, or chocolate 12 hours prior to arrival.  3. NO perfume, cologne or lotion on chest or abdomen area.          - FEMALES - Please avoid wearing dresses to this appointment.  4. Total time is 1 to 2 hours; you may want to bring reading material for the waiting time.  5. Please report to Radiology at the [facility] Main Entrance 30 minutes early for your test.  [address] [place], [place] [address]  6. Please report to Radiology at [facility] Main Entrance, medical mall, 30 mins prior to your test.  [address]  [place], [place]  [phone]  Diabetic Preparation:  Hold oral  medications. You may take NPH and Lantus insulin. Do not take Humalog or Humulin R (Regular Insulin) the day of your test. Check blood sugars prior to leaving the house. If able to eat breakfast prior to 3 hour fasting, you may take all medications, including your insulin, Do not worry if you miss your breakfast dose of insulin - start at your next meal. Patients who wear a continuous glucose monitor MUST remove the device prior to scanning.  IF YOU THINK YOU MAY BE PREGNANT, OR ARE NURSING PLEASE INFORM THE TECHNOLOGIST.  In preparation for your appointment, medication and supplies will be purchased.  Appointment availability is limited, so if you need to cancel or reschedule, please call the Radiology Department at [phone] [facility]) OR [phone] [facility])  24 hours in advance to avoid a cancellation fee of $100.00  What to Expect After you Arrive:  Once you arrive and check in for your appointment, you will be taken to a preparation room within the Radiology Department.  A technologist or Nurse will obtain your medical history, verify that you are correctly prepped for the exam, and explain the procedure.  Afterwards,  an IV will be started in your arm and electrodes will be placed on your skin for EKG monitoring during the stress portion of the exam. Then you will be escorted to the PET/CT scanner.  There, staff will get you positioned on the scanner and obtain a blood pressure and EKG.  During the exam, you will continue to be connected to the EKG and blood pressure machines.  A small, safe amount of a radioactive tracer will be injected in your IV to obtain a series of pictures of your heart along with an injection of a stress agent.    After your Exam:  It is recommended that you eat a meal and drink a caffeinated beverage to counter act any effects of the stress agent.  Drink plenty of fluids for the remainder of the day and urinate frequently for the first couple of hours after the  exam.  Your doctor will inform you of your test  results within 7-10 business days.  For more information and frequently asked questions, please visit our website : [email or web address]  For questions about your test or how to prepare for your test, please call: Cardiac Imaging Nurse Navigators Office: [phone]    Follow-Up: At [facility], you and your health needs are our priority.  As part of our continuing mission to provide you with exceptional heart care, we have created designated Provider Care Teams.  These Care Teams include your primary Cardiologist (physician) and Advanced Practice Providers (APPs -  Physician Assistants and Nurse Practitioners) who all work together to provide you with the care you need, when you need it.  We recommend signing up for the patient portal called "MyChart".  Sign up information is provided on this After Visit Summary.  MyChart is used to connect with patients for Virtual Visits (Telemedicine).  Patients are able to view lab/test results, encounter notes, upcoming appointments, etc.  Non-urgent messages can be sent to your provider as well.   To learn more about what you can do with MyChart, go to [email or web address].    Your next appointment:   1 year(s)  Provider:   Thurmon Fair, MD       Signed, Thurmon Fair, MD  10/13/2022 12:34 PM    [facility]

## 2022-10-09 NOTE — Patient Instructions (Signed)
Medication Instructions:  No changes *If you need a refill on your cardiac medications before your next appointment, please call your pharmacy*  Testing/Procedures: How to Prepare for Your Cardiac PET/CT Stress Test:  1. Please do not take these medications before your test:   Medications that may interfere with the cardiac pharmacological stress agent (ex. nitrates - including erectile dysfunction medications, isosorbide mononitrate, tamulosin or beta-blockers) the day of the exam. (Erectile dysfunction medication should be held for at least 72 hrs prior to test) Theophylline containing medications for 12 hours. Dipyridamole 48 hours prior to the test. Your remaining medications may be taken with water.  2. Nothing to eat or drink, except water, 3 hours prior to arrival time.   NO caffeine/decaffeinated products, or chocolate 12 hours prior to arrival.  3. NO perfume, cologne or lotion on chest or abdomen area.          - FEMALES - Please avoid wearing dresses to this appointment.  4. Total time is 1 to 2 hours; you may want to bring reading material for the waiting time.  5. Please report to Radiology at the [facility] Main Entrance 30 minutes early for your test.  [address] [place], [place] [address]  6. Please report to Radiology at [facility] Main Entrance, medical mall, 30 mins prior to your test.  [address]  [place], [place]  [phone]  Diabetic Preparation:  Hold oral medications. You may take NPH and Lantus insulin. Do not take Humalog or Humulin R (Regular Insulin) the day of your test. Check blood sugars prior to leaving the house. If able to eat breakfast prior to 3 hour fasting, you may take all medications, including your insulin, Do not worry if you miss your breakfast dose of insulin - start at your next meal. Patients who wear a continuous glucose monitor MUST remove the device prior to scanning.  IF YOU  THINK YOU MAY BE PREGNANT, OR ARE NURSING PLEASE INFORM THE TECHNOLOGIST.  In preparation for your appointment, medication and supplies will be purchased.  Appointment availability is limited, so if you need to cancel or reschedule, please call the Radiology Department at [phone] [facility]) OR [phone] [facility])  24 hours in advance to avoid a cancellation fee of $100.00  What to Expect After you Arrive:  Once you arrive and check in for your appointment, you will be taken to a preparation room within the Radiology Department.  A technologist or Nurse will obtain your medical history, verify that you are correctly prepped for the exam, and explain the procedure.  Afterwards,  an IV will be started in your arm and electrodes will be placed on your skin for EKG monitoring during the stress portion of the exam. Then you will be escorted to the PET/CT scanner.  There, staff will get you positioned on the scanner and obtain a blood pressure and EKG.  During the exam, you will continue to be connected to the EKG and blood pressure machines.  A small, safe amount of a radioactive tracer will be injected in your IV to obtain a series of pictures of your heart along with an injection of a stress agent.    After your Exam:  It is recommended that you eat a meal and drink a caffeinated beverage to counter act any effects of the stress agent.  Drink plenty of fluids for the remainder of the day and urinate frequently for the first couple of hours  after the exam.  Your doctor will inform you of your test results within 7-10 business days.  For more information and frequently asked questions, please visit our website : [email or web address]  For questions about your test or how to prepare for your test, please call: Cardiac Imaging Nurse Navigators Office: [phone]    Follow-Up: At [facility], you and your health needs are our priority.  As part of our continuing mission to  provide you with exceptional heart care, we have created designated Provider Care Teams.  These Care Teams include your primary Cardiologist (physician) and Advanced Practice Providers (APPs -  Physician Assistants and Nurse Practitioners) who all work together to provide you with the care you need, when you need it.  We recommend signing up for the patient portal called "MyChart".  Sign up information is provided on this After Visit Summary.  MyChart is used to connect with patients for Virtual Visits (Telemedicine).  Patients are able to view lab/test results, encounter notes, upcoming appointments, etc.  Non-urgent messages can be sent to your provider as well.   To learn more about what you can do with MyChart, go to [email or web address].    Your next appointment:   1 year(s)  Provider:   Thurmon Fair, MD

## 2022-10-15 ENCOUNTER — Encounter (HOSPITAL_COMMUNITY): Payer: Self-pay

## 2022-10-15 ENCOUNTER — Telehealth (HOSPITAL_COMMUNITY): Payer: Self-pay | Admitting: *Deleted

## 2022-10-15 NOTE — Telephone Encounter (Signed)
Reaching out to patient to offer assistance regarding upcoming cardiac imaging study; pt verbalizes understanding of appt date/time, parking situation and where to check in, pre-test NPO status, and verified current allergies; name and call back number provided for further questions should they arise  Larey Brick RN Navigator Cardiac Imaging [facility] [phone] office [phone] cell  Patient aware to avoid Imdur and caffeine prior to her cardiac PET study.

## 2022-10-16 ENCOUNTER — Encounter: Payer: Self-pay | Admitting: Cardiovascular Disease

## 2022-10-16 ENCOUNTER — Ambulatory Visit (HOSPITAL_COMMUNITY)
Admission: RE | Admit: 2022-10-16 | Discharge: 2022-10-16 | Disposition: A | Discharge status: Home / Self Care | Payer: BC Managed Care – PPO | Source: Ambulatory Visit | Attending: Cardiovascular Disease | Admitting: Cardiovascular Disease

## 2022-10-16 DIAGNOSIS — I25118 Atherosclerotic heart disease of native coronary artery with other forms of angina pectoris: Secondary | ICD-10-CM | POA: Diagnosis not present

## 2022-10-16 DIAGNOSIS — R079 Chest pain, unspecified: Secondary | ICD-10-CM | POA: Insufficient documentation

## 2022-10-16 DIAGNOSIS — G4700 Insomnia, unspecified: Secondary | ICD-10-CM

## 2022-10-16 LAB — NM PET CT CARDIAC PERFUSION MULTI W/ABSOLUTE BLOODFLOW
LV dias vol: 87 mL (ref 46–106)
LV sys vol: 32 mL
MBFR: 3.5
Nuc Rest EF: 63 %
Nuc Stress EF: 68 %
Rest MBF: 0.8 ml/g/min
Rest Nuclear Isotope Dose: 22.7 mCi
ST Depression (mm): 0 mm
Stress MBF: 2.8 ml/g/min
Stress Nuclear Isotope Dose: 22.7 mCi

## 2022-10-16 MED ORDER — RUBIDIUM RB82 GENERATOR (RUBYFILL)
22.6500 | PACK | Freq: Once | INTRAVENOUS | Status: AC
  Administered 2022-10-16: 22.65 via INTRAVENOUS

## 2022-10-16 MED ORDER — ISOSORBIDE MONONITRATE ER 30 MG PO TB24: 30.0000 mg | ORAL_TABLET | Freq: Every day | ORAL | 3 refills | Status: DC

## 2022-10-16 MED ORDER — ROSUVASTATIN CALCIUM 40 MG PO TABS: ORAL_TABLET | ORAL | 3 refills | Status: DC

## 2022-10-16 MED ORDER — METOPROLOL TARTRATE 50 MG PO TABS: 50.0000 mg | ORAL_TABLET | Freq: Two times a day (BID) | ORAL | 3 refills | Status: DC

## 2022-10-16 MED ORDER — REGADENOSON 0.4 MG/5ML IV SOLN
INTRAVENOUS | Status: AC
  Filled 2022-10-16: qty 5

## 2022-10-16 MED ORDER — REGADENOSON 0.4 MG/5ML IV SOLN
0.4000 mg | Freq: Once | INTRAVENOUS | Status: AC
  Administered 2022-10-16: 0.4 mg via INTRAVENOUS

## 2022-10-16 MED ORDER — AMLODIPINE BESYLATE 5 MG PO TABS: 5.0000 mg | ORAL_TABLET | Freq: Every day | ORAL | 3 refills | Status: DC

## 2022-10-16 NOTE — Progress Notes (Signed)
Patient presents for a cardiac PET stress test and tolerated procedure without incident. She reported a headache and some tightness around her neck that were nearly gone by the conclusion of the test. Patient maintained acceptable vital signs throughout the test and was offered caffeine after test.  Patient ambulated out of department with a steady gait.

## 2022-10-18 MED ORDER — TRAZODONE HCL 150 MG PO TABS: 150.0000 mg | ORAL_TABLET | Freq: Every day | ORAL | 3 refills | Status: DC

## 2022-10-18 NOTE — Telephone Encounter (Signed)
Ok to send 90 with 3 refills

## 2022-11-27 DIAGNOSIS — I10 Essential (primary) hypertension: Secondary | ICD-10-CM | POA: Diagnosis not present

## 2022-11-27 DIAGNOSIS — Z0001 Encounter for general adult medical examination with abnormal findings: Secondary | ICD-10-CM | POA: Diagnosis not present

## 2022-11-27 DIAGNOSIS — R7301 Impaired fasting glucose: Secondary | ICD-10-CM | POA: Diagnosis not present

## 2022-11-27 DIAGNOSIS — Z1389 Encounter for screening for other disorder: Secondary | ICD-10-CM | POA: Diagnosis not present

## 2022-11-27 LAB — CBC AND DIFFERENTIAL
HCT: 41 (ref 36–46)
Hemoglobin: 13.7 (ref 12.0–16.0)
Neutrophils Absolute: 3.5
Platelets: 194 10*3/uL (ref 150–400)
WBC: 5.8

## 2022-11-27 LAB — HEMOGLOBIN A1C: Hemoglobin A1C: 6.7

## 2022-11-27 LAB — LIPID PANEL
Cholesterol: 213 — AB (ref 0–200)
HDL: 67 (ref 35–70)
LDL Cholesterol: 122
LDl/HDL Ratio: 3.2
Triglycerides: 136 (ref 40–160)

## 2022-11-27 LAB — HEPATIC FUNCTION PANEL
Alkaline Phosphatase: 114 (ref 25–125)
Bilirubin, Total: 0.3

## 2022-11-27 LAB — TSH: TSH: 2.73 (ref 0.41–5.90)

## 2022-11-27 LAB — CBC: RBC: 4.5 (ref 3.87–5.11)

## 2022-11-27 LAB — BASIC METABOLIC PANEL
BUN: 20 (ref 4–21)
Chloride: 105 (ref 99–108)
Creatinine: 0.8 (ref 0.5–1.1)
Glucose: 153
Potassium: 4.2 meq/L (ref 3.5–5.1)
Sodium: 141 (ref 137–147)

## 2022-12-04 DIAGNOSIS — Z1339 Encounter for screening examination for other mental health and behavioral disorders: Secondary | ICD-10-CM | POA: Diagnosis not present

## 2022-12-04 DIAGNOSIS — Z0000 Encounter for general adult medical examination without abnormal findings: Secondary | ICD-10-CM | POA: Diagnosis not present

## 2022-12-04 DIAGNOSIS — E119 Type 2 diabetes mellitus without complications: Secondary | ICD-10-CM | POA: Diagnosis not present

## 2022-12-04 DIAGNOSIS — Z1331 Encounter for screening for depression: Secondary | ICD-10-CM | POA: Diagnosis not present

## 2022-12-05 DIAGNOSIS — E119 Type 2 diabetes mellitus without complications: Secondary | ICD-10-CM | POA: Diagnosis not present

## 2022-12-05 DIAGNOSIS — I10 Essential (primary) hypertension: Secondary | ICD-10-CM | POA: Diagnosis not present

## 2022-12-05 DIAGNOSIS — R82998 Other abnormal findings in urine: Secondary | ICD-10-CM | POA: Diagnosis not present

## 2022-12-25 ENCOUNTER — Encounter: Payer: Self-pay | Admitting: Pharmacist

## 2023-01-14 DIAGNOSIS — E119 Type 2 diabetes mellitus without complications: Secondary | ICD-10-CM | POA: Diagnosis not present

## 2023-01-20 DIAGNOSIS — Z0289 Encounter for other administrative examinations: Secondary | ICD-10-CM

## 2023-01-21 ENCOUNTER — Encounter: Payer: Self-pay | Admitting: Nurse Practitioner

## 2023-01-21 ENCOUNTER — Ambulatory Visit (INDEPENDENT_AMBULATORY_CARE_PROVIDER_SITE_OTHER): Payer: BC Managed Care – PPO | Admitting: Nurse Practitioner

## 2023-01-21 VITALS — BP 131/82 | HR 72 | Temp 97.9°F | Ht 66.0 in | Wt 190.0 lb

## 2023-01-21 DIAGNOSIS — R002 Palpitations: Secondary | ICD-10-CM

## 2023-01-21 DIAGNOSIS — I10 Essential (primary) hypertension: Secondary | ICD-10-CM

## 2023-01-21 DIAGNOSIS — E1169 Type 2 diabetes mellitus with other specified complication: Secondary | ICD-10-CM

## 2023-01-21 DIAGNOSIS — E785 Hyperlipidemia, unspecified: Secondary | ICD-10-CM

## 2023-01-21 DIAGNOSIS — I25118 Atherosclerotic heart disease of native coronary artery with other forms of angina pectoris: Secondary | ICD-10-CM

## 2023-01-21 DIAGNOSIS — Z6830 Body mass index (BMI) 30.0-30.9, adult: Secondary | ICD-10-CM

## 2023-01-21 DIAGNOSIS — E66811 Obesity, class 1: Secondary | ICD-10-CM

## 2023-01-21 NOTE — Progress Notes (Signed)
Office: [phone]  /  Fax: [phone]   Initial Visit  Nancy Sparks was seen in clinic today to evaluate for obesity. She is interested in losing weight to improve overall health and reduce the risk of weight related complications. She presents today to review program treatment options, initial physical assessment, and evaluation.     She was referred by: Specialist  When asked what else they would like to accomplish? She states: Adopt healthier eating patterns, Improve energy levels and physical activity, Improve quality of life, Improve appearance, and Improve self-confidence   When asked how has your weight affected you? She states: Having fatigue and Having poor endurance  Some associated conditions: CAD-heart stent to the right coronary artery in June 2019, hypertension, heart murmur, PVCs, GERD, OAB, anxiety, hyperlipidemia, DMT2  Contributing factors: Family history of obesity, Use of obesogenic medications: Beta-blockers, Psychotropic medications, and Contraceptives or hormonal therapy, and Menopause  Weight promoting medications identified: Beta-blockers and Contraceptives or hormonal therapy  Current nutrition plan: None  Current level of physical activity: Pickleball, walking  Current or previous pharmacotherapy:  Wegovy-recently prescribed Ozempic but hasn't started taking it yet.   Response to medication: Lost weight initially but was unable to sustain weight loss-stopped Wegovy due to cost and GI side effects.     Past medical history includes:   Past Medical History:  Diagnosis Date   Anemia    "w/pregnancies   Anxiety    hx of   CAD (coronary artery disease)    LHC 6/19: pLAD, oD1 75, pRCA 20, mRCA 90 >> PCI: DES to RCA // Echo 6/19: EF 55-60, normal wall motion   Complication of anesthesia    itching relieved with benadryl w/all my surgeries (07/24/2017)   Depression    hx   Dyslipidemia    Fibroid    GERD (gastroesophageal reflux disease)    PRN  meds   Gestational diabetes    hx of   Heart murmur    hx   Heart palpitations    hx of   Hx of adenomatous polyp of colon 12/24/2002   Hyperlipidemia    on meds   Hypertension    on meds     Objective:   BP 131/82   Pulse 72   Temp 97.9 F (36.6 C)   Ht 5\' 6"  (1.676 m)   Wt 190 lb (86.2 kg)   SpO2 97%   BMI 30.67 kg/m  She was weighed on the bioimpedance scale: Body mass index is 30.67 kg/m.  Peak Weight:193 lbs , Body Fat%:38.7, Visceral Fat Rating:10, Weight trend over the last 12 months: Increasing  General:  Alert, oriented and cooperative. Patient is in no acute distress.  Respiratory: Normal respiratory effort, no problems with respiration noted   Gait: able to ambulate independently  Mental Status: Normal mood and affect. Normal behavior. Normal judgment and thought content.   DIAGNOSTIC DATA REVIEWED:  BMET    Component Value Date/Time   NA 136 09/25/2022 1115   NA 141 09/03/2017 1609   K 4.7 09/25/2022 1115   CL 103 09/25/2022 1115   CO2 25 09/25/2022 1115   GLUCOSE 113 (H) 09/25/2022 1115   BUN 13 09/25/2022 1115   BUN 20 09/03/2017 1609   CREATININE 0.74 09/25/2022 1115   CALCIUM 9.9 09/25/2022 1115   GFRNONAA 86 09/03/2017 1609   GFRAA 100 09/03/2017 1609   No results found for: "HGBA1C" No results found for: "INSULIN" CBC    Component Value Date/Time  WBC 5.6 09/25/2022 1115   RBC 4.38 09/25/2022 1115   HGB 13.0 09/25/2022 1115   HGB 13.1 09/03/2017 1609   HCT 39.3 09/25/2022 1115   HCT 38.7 09/03/2017 1609   PLT 218 09/25/2022 1115   PLT 178 09/03/2017 1609   MCV 89.7 09/25/2022 1115   MCV 90 09/03/2017 1609   MCH 29.7 09/25/2022 1115   MCHC 33.1 09/25/2022 1115   RDW 13.2 09/25/2022 1115   RDW 14.1 09/03/2017 1609   Iron/TIBC/Ferritin/ %Sat No results found for: "IRON", "TIBC", "FERRITIN", "IRONPCTSAT" Lipid Panel     Component Value Date/Time   CHOL 218 (H) 09/25/2022 1115   TRIG 159 (H) 09/25/2022 1115   HDL 64  09/25/2022 1115   CHOLHDL 3.4 09/25/2022 1115   VLDL 12 07/25/2017 1051   LDLCALC 127 (H) 09/25/2022 1115   LDLDIRECT 115 (H) 09/03/2017 1209   LDLDIRECT 155.5 05/14/2011 0924   Hepatic Function Panel     Component Value Date/Time   PROT 7.3 09/25/2022 1115   ALBUMIN 4.4 05/05/2020 0929   AST 27 09/25/2022 1115   ALT 35 (H) 09/25/2022 1115   ALKPHOS 92 05/05/2020 0929   BILITOT 0.4 09/25/2022 1115   BILIDIR <0.1 (L) 07/24/2017 2258   IBILI NOT CALCULATED 07/24/2017 2258      Component Value Date/Time   TSH 1.97 03/18/2017 1525     Assessment and Plan:   Type 2 diabetes mellitus with hyperlipidemia (HCC) Continue to follow up with PCP. Continue meds as directed  Essential hypertension Continue to follow-up with cardiology.  Continue meds as directed.  Coronary artery disease of native artery of native heart with stable angina pectoris [facility]) Continue follow-up with cardiology.  Continue meds as directed.  Heart palpitations Continue follow-up with cardiology.  Continue meds as directed.  Class 1 drug-induced obesity with serious comorbidity and body mass index (BMI) of 30.0 to 30.9 in adult        Obesity Treatment / Action Plan:  Patient will work on garnering support from family and friends to begin weight loss journey. Will work on eliminating or reducing the presence of highly palatable, calorie dense foods in the home. Will complete provided nutritional and psychosocial assessment questionnaire before the next appointment. Will be scheduled for indirect calorimetry to determine resting energy expenditure in a fasting state.  This will allow [place] to create a reduced calorie, high-protein meal plan to promote loss of fat mass while preserving muscle mass. Counseled on the health benefits of losing 5%-15% of total body weight. Was counseled on nutritional approaches to weight loss and benefits of reducing processed foods and consuming plant-based foods and high  quality protein as part of nutritional weight management. Was counseled on pharmacotherapy and role as an adjunct in weight management.   Obesity Education Performed Today:  She was weighed on the bioimpedance scale and results were discussed and documented in the synopsis.  We discussed obesity as a disease and the importance of a more detailed evaluation of all the factors contributing to the disease.  We discussed the importance of long term lifestyle changes which include nutrition, exercise and behavioral modifications as well as the importance of customizing this to her specific health and social needs.  We discussed the benefits of reaching a healthier weight to alleviate the symptoms of existing conditions and reduce the risks of the biomechanical, metabolic and psychological effects of obesity.  Nancy Sparks appears to be in the action stage of change and states they are  ready to start intensive lifestyle modifications and behavioral modifications.  30 minutes was spent today on this visit including the above counseling, pre-visit chart review, and post-visit documentation.  Reviewed by clinician on day of visit: allergies, medications, problem list, medical history, surgical history, family history, social history, and previous encounter notes pertinent to obesity diagnosis.    Theodis Sato Chakita Mcgraw FNP-C

## 2023-02-06 ENCOUNTER — Encounter: Payer: Self-pay | Admitting: Cardiovascular Disease

## 2023-02-11 ENCOUNTER — Encounter: Payer: Self-pay | Admitting: Bariatrics

## 2023-02-11 ENCOUNTER — Ambulatory Visit (INDEPENDENT_AMBULATORY_CARE_PROVIDER_SITE_OTHER): Payer: BC Managed Care – PPO | Admitting: Bariatrics

## 2023-02-11 VITALS — BP 160/94 | HR 72 | Temp 97.8°F | Ht 66.0 in | Wt 192.0 lb

## 2023-02-11 DIAGNOSIS — Z1331 Encounter for screening for depression: Secondary | ICD-10-CM | POA: Diagnosis not present

## 2023-02-11 DIAGNOSIS — E6609 Other obesity due to excess calories: Secondary | ICD-10-CM

## 2023-02-11 DIAGNOSIS — E785 Hyperlipidemia, unspecified: Secondary | ICD-10-CM | POA: Diagnosis not present

## 2023-02-11 DIAGNOSIS — E66811 Other obesity due to excess calories: Secondary | ICD-10-CM

## 2023-02-11 DIAGNOSIS — R0602 Shortness of breath: Secondary | ICD-10-CM

## 2023-02-11 DIAGNOSIS — I25118 Atherosclerotic heart disease of native coronary artery with other forms of angina pectoris: Secondary | ICD-10-CM | POA: Diagnosis not present

## 2023-02-11 DIAGNOSIS — E119 Type 2 diabetes mellitus without complications: Secondary | ICD-10-CM

## 2023-02-11 DIAGNOSIS — E669 Obesity, unspecified: Secondary | ICD-10-CM

## 2023-02-11 DIAGNOSIS — E538 Deficiency of other specified B group vitamins: Secondary | ICD-10-CM

## 2023-02-11 DIAGNOSIS — Z6831 Body mass index (BMI) 31.0-31.9, adult: Secondary | ICD-10-CM

## 2023-02-11 DIAGNOSIS — R5383 Other fatigue: Secondary | ICD-10-CM | POA: Diagnosis not present

## 2023-02-11 DIAGNOSIS — E1169 Type 2 diabetes mellitus with other specified complication: Secondary | ICD-10-CM | POA: Diagnosis not present

## 2023-02-11 DIAGNOSIS — E559 Vitamin D deficiency, unspecified: Secondary | ICD-10-CM

## 2023-02-11 DIAGNOSIS — I10 Essential (primary) hypertension: Secondary | ICD-10-CM

## 2023-02-11 DIAGNOSIS — Z7985 Long-term (current) use of injectable non-insulin antidiabetic drugs: Secondary | ICD-10-CM

## 2023-02-11 NOTE — Progress Notes (Signed)
 At a Glance:  Vitals Temp: 97.8 F (36.6 C) BP: (!) 169/104 Pulse Rate: 72 SpO2: 96 %   Anthropometric Measurements Height: 5' 6 (1.676 m) Weight: 192 lb (87.1 kg) BMI (Calculated): 31 Starting Weight: 192lb   Body Composition  Body Fat %: 39.5 % Fat Mass (lbs): 76 lbs Muscle Mass (lbs): 110.8 lbs Total Body Water (lbs): 78.8 lbs Visceral Fat Rating : 10   Other Clinical Data RMR: 2146 Fasting: yes Labs: yes Today's Visit #: 1 Starting Date: 02/11/23     Indirect Calorimeter:   Resting Metabolic Rate ( RMR):  RMR (actual): 2146 kcal RMR (calculated): 1595 kcal The calculated basal metabolic rate is 8404 thus her basal metabolic rate is better than expected.  Plan:   Indirect calorimeter completed, interpreted and reviewed with patient today and allowed to ask questions.  Discussed the implications for the chosen plan and exercise based on the RMR reading.  Will consider repeating the RMR in the future based on weight loss.    Chief Complaint:  Obesity   Subjective:  EMMAJANE ALTAMURA (MR# [id number]) is a 58 y.o. female who presents for evaluation and treatment of obesity and related comorbidities.   Sarahlynn is currently in the action stage of change and ready to dedicate time achieving and maintaining a healthier weight. Ananiah is interested in becoming our patient and working on intensive lifestyle modifications including (but not limited to) diet and exercise for weight loss.  Sylvia has been struggling with her weight. She has been unsuccessful in either losing weight, maintaining weight loss, or reaching her healthy weight goal.  Nai's habits were reviewed today and are as follows: Her family eats meals together, she thinks her family will eat healthier with her, she started gaining weight at age 42, she skips meals frequently, she has problems with excessive hunger, and she frequently eats larger portions than normal.   She started gaining weight at  age 44. She has dealt with weight issues for most of her life.   Current or previous pharmacotherapy: GLP-1  Response to medication: currently taking Ozempic as of 1 year.   Other Fatigue Temperence admits to daytime somnolence and admits to waking up still tired. Patient has a history of symptoms of daytime fatigue. Millicent generally gets 7 or 8 hours of sleep per night, and states that she has generally restful sleep. Snoring is present. Apneic episodes is not present. Epworth Sleepiness Score is 4.   Shortness of Breath Tayleigh notes increasing shortness of breath with exercising and seems to be worsening over time with weight gain. She notes getting out of breath sooner with activity than she used to. This has not gotten worse recently. Annasophia denies shortness of breath at rest or orthopnea.  Depression Screen Rebakah's Food and Mood (modified PHQ-9) score was 17. 15-19 moderate severe depression     10/01/2017    3:48 PM  Depression screen PHQ 2/9  Decreased Interest 0  Down, Depressed, Hopeless 0  PHQ - 2 Score 0     Assessment and Plan:   Other Fatigue Tiye does feel that her weight is causing her energy to be lower than it should be. Fatigue may be related to obesity, depression or many other causes. Labs will be ordered, and in the meanwhile, Charae will focus on self care including making healthy food choices, increasing physical activity and focusing on stress reduction.  Shortness of Breath Denece does not feel that she gets out of breath more easily  that she used to when she exercises. Xaniyah's shortness of breath appears to be obesity related and exercise induced. She has agreed to work on weight loss and gradually increase exercise to treat her exercise induced shortness of breath. Will continue to monitor closely.  Health Maintenance:   Obesity   Plan: Will do  indirect calorimetry, and labs.  EKG was done on 10/09/2022.    Vitamin D  Deficiency She is at risk for vitamin D   deficiency due to obesity.   She is not on vitamin D .   Plan: Will check for vitamin D  deficiency.   Dyneisha had a positive depression screening. Depression is commonly associated with obesity and often results in emotional eating behaviors. We will monitor this closely and work on CBT to help improve the non-hunger eating patterns. Referral to Psychology may be required if no improvement is seen as she continues in our clinic.   Vitamin B12 deficiency:   She is at risk for B 12 deficiency.  She is not taking B12 or any other B vitamins at this time.  Plan: Will check B12.  Type II Diabetes HgbA1c is at goal. Last A1c was 6.7 CBGs: Fasting 115 to 140       Episodes of hypoglycemia: yes - dawn phenomenon Medication(s): Ozempic 0.25 mg SQ weekly  No results found for: HGBA1C Lab Results  Component Value Date   LDLCALC 127 (H) 09/25/2022   CREATININE 0.74 09/25/2022   Lab Results  Component Value Date   GFR 72.05 05/05/2020   GFR 92.53 05/14/2011    Plan: Continue Ozempic 0.25 mg SQ weekly and Metformin 500 mg twice daily with meals Continue all other medications. (Will begin Metformin 500 mg twice daily with meals.) Will keep all carbohydrates low both sweets and starches.  Will continue exercise regimen to 30 to 60 minutes on most days of the week.  Aim for 7 to 9 hours of sleep nightly.  Eat more low glycemic index foods.   Hypertension Hypertension control uncertain.  Medication(s): Amlodipine  5 mg 1 daily   BP Readings from Last 3 Encounters:  02/11/23 (!) 169/104  01/21/23 131/82  10/16/22 125/69   Lab Results  Component Value Date   CREATININE 0.74 09/25/2022   CREATININE 0.90 05/05/2020   CREATININE 0.79 09/03/2017   Lab Results  Component Value Date   GFR 72.05 05/05/2020   GFR 92.53 05/14/2011    Plan: Continue all antihypertensives at current dosages. No added salt. Will keep sodium content to 1,500 mg or less per day.    CAD of native  artery:   She had an EKG on 10/09/2022. She sees cardiologist about 2 X a year. She is taking Repatha .   Plan: She will continue to see her cardiologist.   Previous labs reviewed today. Date: 11/27/2022 CMP, Lipids, HgbA1c, and CBC and TSH  Labs done today CMP, Lipids, Insulin , Vit D, and Vit B12   Generalized Obesity: BMI (Calculated): 31   Callie is currently in the action stage of change and her goal is to begin weight loss efforts. I recommend Glendora begin the structured treatment plan as follows:  She has agreed to Category 3 Plan  Exercise goals: For substantial health benefits, adults should do at least 150 minutes (2 hours and 30 minutes) a week of moderate-intensity, or 75 minutes (1 hour and 15 minutes) a week of vigorous-intensity aerobic physical activity, or an equivalent combination of moderate- and vigorous-intensity aerobic activity. Aerobic activity should be performed in episodes  of at least 10 minutes, and preferably, it should be spread throughout the week.  Behavioral modification strategies:increasing lean protein intake, decreasing simple carbohydrates, increase H2O intake, no skipping meals, keeping healthy foods in the home, better snacking choices, and avoiding temptations  She was informed of the importance of frequent follow-up visits to maximize her success with intensive lifestyle modifications for her multiple health conditions. She was informed we would discuss her lab results at her next visit unless there is a critical issue that needs to be addressed sooner. Alece agreed to keep her next visit at the agreed upon time to discuss these results.  Objective:  General: Cooperative, alert, well developed, in no acute distress. HEENT: Conjunctivae and lids unremarkable. Cardiovascular: Regular rhythm.  Lungs: Normal work of breathing. Neurologic: No focal deficits.   Lab Results  Component Value Date   CREATININE 0.74 09/25/2022   BUN 13 09/25/2022   NA  136 09/25/2022   K 4.7 09/25/2022   CL 103 09/25/2022   CO2 25 09/25/2022   Lab Results  Component Value Date   ALT 35 (H) 09/25/2022   AST 27 09/25/2022   ALKPHOS 92 05/05/2020   BILITOT 0.4 09/25/2022   No results found for: HGBA1C No results found for: INSULIN  Lab Results  Component Value Date   TSH 1.97 03/18/2017   Lab Results  Component Value Date   CHOL 218 (H) 09/25/2022   HDL 64 09/25/2022   LDLCALC 127 (H) 09/25/2022   LDLDIRECT 115 (H) 09/03/2017   TRIG 159 (H) 09/25/2022   CHOLHDL 3.4 09/25/2022   Lab Results  Component Value Date   WBC 5.6 09/25/2022   HGB 13.0 09/25/2022   HCT 39.3 09/25/2022   MCV 89.7 09/25/2022   PLT 218 09/25/2022   No results found for: IRON, TIBC, FERRITIN  Attestation Statements:  Applicable history such as the following:  allergies, medications, problem list, medical history, surgical history, family history, social history, and previous encounter notes reviewed by clinician on day of visit:  Time spent on visit including the items listed below was 50 minutes.  -preparing to see the patient (e.g., review of tests, history, previous notes) -obtaining and/or reviewing separately obtained history -counseling and educating the patient/family/caregiver -documenting clinical information in the electronic or other health record -ordering medications, tests, or procedures -independently interpreting results and communicating results to the patient/ family/caregiver -referring and communicating with other health care professionals  -care coordination   This may have been prepared with the assistance of Engineer, Civil (consulting).  Occasional wrong-word or sound-a-like substitutions may have occurred due to the inherent limitations of voice recognition software.    Clayborne Daring, DO

## 2023-02-12 ENCOUNTER — Encounter: Payer: Self-pay | Admitting: Bariatrics

## 2023-02-12 DIAGNOSIS — E559 Vitamin D deficiency, unspecified: Secondary | ICD-10-CM | POA: Insufficient documentation

## 2023-02-12 LAB — LIPID PANEL WITH LDL/HDL RATIO
Cholesterol, Total: 148 mg/dL (ref 100–199)
HDL: 69 mg/dL (ref >39)
LDL Chol Calc (NIH): 57 mg/dL (ref 0–99)
LDL/HDL Ratio: 0.8 {ratio} (ref 0.0–3.2)
Triglycerides: 127 mg/dL (ref 0–149)
VLDL Cholesterol Cal: 22 mg/dL (ref 5–40)

## 2023-02-12 LAB — INSULIN, RANDOM: INSULIN: 14.9 u[IU]/mL (ref 2.6–24.9)

## 2023-02-12 LAB — VITAMIN B12: Vitamin B-12: 424 pg/mL (ref 232–1245)

## 2023-02-12 LAB — VITAMIN D 25 HYDROXY (VIT D DEFICIENCY, FRACTURES): Vit D, 25-Hydroxy: 24.3 ng/mL — ABNORMAL LOW (ref 30.0–100.0)

## 2023-02-24 ENCOUNTER — Other Ambulatory Visit: Payer: Self-pay

## 2023-02-25 ENCOUNTER — Ambulatory Visit: Payer: BC Managed Care – PPO | Admitting: Bariatrics

## 2023-02-25 ENCOUNTER — Encounter: Payer: Self-pay | Admitting: Bariatrics

## 2023-02-25 VITALS — BP 148/92 | HR 85 | Temp 97.6°F | Ht 66.0 in | Wt 191.0 lb

## 2023-02-25 DIAGNOSIS — E785 Hyperlipidemia, unspecified: Secondary | ICD-10-CM | POA: Diagnosis not present

## 2023-02-25 DIAGNOSIS — E669 Obesity, unspecified: Secondary | ICD-10-CM

## 2023-02-25 DIAGNOSIS — E1169 Type 2 diabetes mellitus with other specified complication: Secondary | ICD-10-CM | POA: Diagnosis not present

## 2023-02-25 DIAGNOSIS — Z7985 Long-term (current) use of injectable non-insulin antidiabetic drugs: Secondary | ICD-10-CM

## 2023-02-25 DIAGNOSIS — E6609 Other obesity due to excess calories: Secondary | ICD-10-CM

## 2023-02-25 DIAGNOSIS — E559 Vitamin D deficiency, unspecified: Secondary | ICD-10-CM | POA: Diagnosis not present

## 2023-02-25 DIAGNOSIS — Z6830 Body mass index (BMI) 30.0-30.9, adult: Secondary | ICD-10-CM

## 2023-02-25 MED ORDER — VITAMIN D (ERGOCALCIFEROL) 1.25 MG (50000 UNIT) PO CAPS: 50000.0000 [IU] | ORAL_CAPSULE | ORAL | 0 refills | Status: DC

## 2023-02-25 NOTE — Progress Notes (Signed)
First follow-up after initial visit.        WEIGHT SUMMARY AND BIOMETRICS  Weight Lost Since Last Visit: 1lb  Weight Gained Since Last Visit: 0   Vitals Temp: 97.6 F (36.4 C) BP: (!) 148/92 Pulse Rate: 85 SpO2: 98 %   Anthropometric Measurements Height: 5\' 6"  (1.676 m) Weight: 191 lb (86.6 kg) BMI (Calculated): 30.84 Weight at Last Visit: 192lb Weight Lost Since Last Visit: 1lb Weight Gained Since Last Visit: 0 Starting Weight: 192lb Total Weight Loss (lbs): 1 lb (0.454 kg)   Body Composition  Body Fat %: 38.4 % Fat Mass (lbs): 73.6 lbs Muscle Mass (lbs): 112.2 lbs Total Body Water (lbs): 77.6 lbs Visceral Fat Rating : 10   Other Clinical Data Fasting: no Labs: no Today's Visit #: 2 Starting Date: 02/11/23    OBESITY Nancy Sparks is here to discuss her progress with her obesity treatment plan along with follow-up of her obesity related diagnoses.    Nutrition Plan: the Category 3 plan - 25% adherence.  Current exercise:  Personal trainer   Interim History:  She is down 1 additional lb since her first visit.  She states that she has been following weight watchers plan and has been less adherent to our plan at this time. Eating all of the food on the plan., Protein intake is as prescribed, Is not skipping meals, and Water intake is adequate.  Initial positives regarding the dietary plan: She is changing her habits over time.  Initial challenges regarding  the dietary plan:   Pharmacotherapy: She has recently started Ozempic for her diabetes.   Hunger is moderately controlled.  Cravings are moderately controlled.  Assessment/Plan:   Type II Diabetes HgbA1c is not at goal. Last A1c was 6.7. She has a new diagnosis of diabetes type 2. She has a history of gestational diabetes.  CBGs: Post prandial 100 to 130       Episodes of hypoglycemia:  yes - as low as 59 in the am.  Medication(s): She recently started on Ozempic and now is at the dose of 0.5 mg weekly.  She is using a continuous glucose monitor but has had difficulty with it staying attached to her arm.   Lab Results  Component Value Date   HGBA1C 6.7 11/27/2022   Lab Results  Component Value Date   LDLCALC 57 02/11/2023   CREATININE 0.8 11/27/2022   Lab Results  Component Value Date   GFR 72.05 05/05/2020   GFR 92.53 05/14/2011    Plan: Continue Ozempic 0.5 mg SQ weekly (new start).  Discussed options to help with her constipation which is probably secondary to protein in her Ozempic such as increased water, increased  fiber slowly, and Colace.  She can additionally try either Citrucel or MiraLAX over-the-counter. She will continue to use her continuous glucose monitor and discuss options for keeping her monitor in place. She was given our information sheet on hypoglycemia and we discussed keeping glucose tablets on hand. She will decrease her carbohydrates at night and will have more protein and healthy fats. Will keep all carbohydrates low both sweets and starches.  Will continue exercise regimen to 30 to 60 minutes on most days of the week.  Aim for 7 to 9 hours of sleep nightly.  Eat more low glycemic index foods.    Vitamin D Deficiency Vitamin D is not at goal of 50.  Most recent vitamin D level was 24.3. She is on  prescription ergocalciferol 50,000 IU weekly. Lab Results  Component Value Date   VD25OH 24.3 (L) 02/11/2023    Plan: Continue prescription vitamin D 50,000 IU weekly.     Generalized Obesity: Current BMI BMI (Calculated): 30.84   Pharmacotherapy Plan Continue  Ozempic 0.5 mg SQ weekly  Leisha is currently in the action stage of change. As such, her goal is to continue with weight loss efforts.  She has agreed to the Category 3 plan.  Exercise goals: All adults should avoid inactivity. Some physical activity is better than none,  and adults who participate in any amount of physical activity gain some health benefits.  Behavioral modification strategies: increasing lean protein intake, decreasing simple carbohydrates , no meal skipping, meal planning , increase water intake, better snacking choices, planning for success, increasing lower sugar fruits, increasing fiber rich foods, avoiding temptations, keep healthy foods in the home, weigh protein portions, mindful eating, and eat on smaller plate.  Elide has agreed to follow-up with our clinic in 2 weeks.    Labs reviewed today from last visit (CMP, Lipids, HgbA1c, insulin, vitamin D, B 12, and thyroid panel).   Objective:   VITALS: Per patient if applicable, see vitals. GENERAL: Alert and in no acute distress. CARDIOPULMONARY: No increased WOB. Speaking in clear sentences.  PSYCH: Pleasant and cooperative. Speech normal rate and rhythm. Affect is appropriate. Insight and judgement are appropriate. Attention is focused, linear, and appropriate.  NEURO: Oriented as arrived to appointment on time with no prompting.   Attestation Statements:   This was prepared with the assistance of Engineer, civil (consulting).  Occasional wrong-word or sound-a-like substitutions may have occurred due to the inherent limitations of voice recognition software.   Corinna Capra, DO

## 2023-03-04 ENCOUNTER — Encounter: Payer: Self-pay | Admitting: Bariatrics

## 2023-03-04 DIAGNOSIS — R7303 Prediabetes: Secondary | ICD-10-CM | POA: Insufficient documentation

## 2023-03-11 ENCOUNTER — Encounter (INDEPENDENT_AMBULATORY_CARE_PROVIDER_SITE_OTHER): Payer: Self-pay | Admitting: Adult Health

## 2023-03-11 ENCOUNTER — Ambulatory Visit (INDEPENDENT_AMBULATORY_CARE_PROVIDER_SITE_OTHER): Payer: BC Managed Care – PPO | Admitting: Adult Health

## 2023-03-11 VITALS — BP 118/77 | HR 73 | Temp 97.4°F | Ht 66.0 in | Wt 191.0 lb

## 2023-03-11 DIAGNOSIS — E1169 Type 2 diabetes mellitus with other specified complication: Secondary | ICD-10-CM | POA: Diagnosis not present

## 2023-03-11 DIAGNOSIS — K5903 Drug induced constipation: Secondary | ICD-10-CM | POA: Diagnosis not present

## 2023-03-11 DIAGNOSIS — E785 Hyperlipidemia, unspecified: Secondary | ICD-10-CM

## 2023-03-11 DIAGNOSIS — Z7985 Long-term (current) use of injectable non-insulin antidiabetic drugs: Secondary | ICD-10-CM

## 2023-03-11 DIAGNOSIS — I10 Essential (primary) hypertension: Secondary | ICD-10-CM

## 2023-03-11 DIAGNOSIS — E559 Vitamin D deficiency, unspecified: Secondary | ICD-10-CM

## 2023-03-11 DIAGNOSIS — Z6830 Body mass index (BMI) 30.0-30.9, adult: Secondary | ICD-10-CM

## 2023-03-11 DIAGNOSIS — E66811 Obesity, class 1: Secondary | ICD-10-CM

## 2023-03-11 NOTE — Progress Notes (Signed)
 WEIGHT SUMMARY AND BIOMETRICS  Vitals Temp: (!) 97.4 F (36.3 C) BP: 118/77 Pulse Rate: 73 SpO2: 97 %   Anthropometric Measurements Height: 5' 6 (1.676 m) Weight: 191 lb (86.6 kg) BMI (Calculated): 30.84 Weight at Last Visit: 191lb Weight Lost Since Last Visit: 0 Weight Gained Since Last Visit: 0 Starting Weight: 192lb Total Weight Loss (lbs): 1 lb (0.454 kg)   Body Composition  Body Fat %: 39.4 % Fat Mass (lbs): 75.4 lbs Muscle Mass (lbs): 110 lbs Total Body Water (lbs): 78.8 lbs Visceral Fat Rating : 10   Other Clinical Data Fasting: no Labs: no Today's Visit #: 3 Starting Date: 02/11/23    Chief Complaint:   OBESITY Nancy Sparks is here to discuss her progress with her obesity treatment plan. She is on the the Category 3 Plan / Weight Watchers 23 Points and states she is following her eating plan approximately 20 % of the time.  She states she is exercising Personal Trainer/Walking 45/20+ minutes 2/2 times per week.   Interim History:  She has used Weight Watchers [facility]) on/off for 20 years. Best weight loss with [employer] has been 30 lbs 0 Points: Eggs, [place], Salsa. Chicken,  1 Point: 1 slice of velveta chese 2 Points: Serving of Olive Oil 7 Points: Real pour of wine   She has been following a combination of Cat 3 Meal Plan and [employer] theory.  Subjective:   1. Drug-induced constipation Since restarting Ozempic she has experienced constipation. Previous to GLP-1 therapy she would have daily BMs Currently on Ozempic 0.5mg  and now having BM every other day She has been using OTC Colace and Miralax   2. Type 2 diabetes mellitus with hyperlipidemia (HCC) Lab Results  Component Value Date   HGBA1C 6.7 11/27/2022     Latest Reference Range & Units 05/05/20 09:29 09/25/22 11:15 11/27/22 00:00  Glucose 65 - 99 mg/dL 856 (H) 886 (H) 846 (E)  (H): Data is abnormally high (E): External lab result  New onset diabetes She was on GLP-1 therapy briefly in  2024 PCP recently started Ozempic She has followed this titration schedule: 2 weeks on Ozempic 0.25mg  2 weeks on Ozempic 0.5mg  She will increase to Ozempic 1mg  this Sunday- plans to be on this dose for at least 4 weeks  PCP also started her on Metformin- she only had a few doses and stopped due to SE  3. Vitamin D  deficiency  Latest Reference Range & Units 02/11/23 09:27  Vitamin D , 25-Hydroxy 30.0 - 100.0 ng/mL 24.3 (L)  Vitamin B12 232 - 1,245 pg/mL 424  (L): Data is abnormally low  She is on weekly Ergocalciferol - denies N/V/Muscle Weakness  4. Essential hypertension BP at goal at OV She is on  aspirin  81 MG EC tablet  Evolocumab  (REPATHA  SURECLICK) 140 MG/ML SOAJ  rosuvastatin  (CRESTOR ) 40 MG tablet  isosorbide  mononitrate (IMDUR ) 30 MG 24 hr tablet  amLODipine  (NORVASC ) 5 MG tablet  metoprolol  tartrate (LOPRESSOR ) 50 MG tablet   Assessment/Plan:   1. Drug-induced constipation Remain well hydrated and increase fiber Remain as active as possible  2. Type 2 diabetes mellitus with hyperlipidemia (HCC) (Primary) Continue Ozempic titration per PCP  3. Vitamin D  deficiency Continue weekly Ergocalciferol   4. Essential hypertension Continue  aspirin  81 MG EC tablet  Evolocumab  (REPATHA  SURECLICK) 140 MG/ML SOAJ  rosuvastatin  (CRESTOR ) 40 MG tablet  isosorbide  mononitrate (IMDUR ) 30 MG 24 hr tablet  amLODipine  (NORVASC ) 5 MG tablet  metoprolol  tartrate (LOPRESSOR ) 50 MG tablet  5. Class 1 obesity due to excess calories with serious comorbidity and body mass index (BMI) of 30.0 to 30.9 in adult, Current BMI 30.84  Nancy Sparks is currently in the action stage of change. As such, her goal is to continue with weight loss efforts. She has agreed to the Category 3 Plan.   Exercise goals: For substantial health benefits, adults should do at least 150 minutes (2 hours and 30 minutes) a week of moderate-intensity, or 75 minutes (1 hour and 15 minutes) a week of vigorous-intensity  aerobic physical activity, or an equivalent combination of moderate- and vigorous-intensity aerobic activity. Aerobic activity should be performed in episodes of at least 10 minutes, and preferably, it should be spread throughout the week.  Behavioral modification strategies: increasing lean protein intake, decreasing simple carbohydrates, increasing vegetables, increasing water intake, no skipping meals, meal planning and cooking strategies, keeping healthy foods in the home, ways to avoid boredom eating, and planning for success.  Nancy Sparks has agreed to follow-up with our clinic in 4 weeks. She was informed of the importance of frequent follow-up visits to maximize her success with intensive lifestyle modifications for her multiple health conditions.   Objective:   Blood pressure 118/77, pulse 73, temperature (!) 97.4 F (36.3 C), height 5' 6 (1.676 m), weight 191 lb (86.6 kg), SpO2 97%. Body mass index is 30.83 kg/m.  General: Cooperative, alert, well developed, in no acute distress. HEENT: Conjunctivae and lids unremarkable. Cardiovascular: Regular rhythm.  Lungs: Normal work of breathing. Neurologic: No focal deficits.   Lab Results  Component Value Date   CREATININE 0.8 11/27/2022   BUN 20 11/27/2022   NA 141 11/27/2022   K 4.2 11/27/2022   CL 105 11/27/2022   CO2 25 09/25/2022   Lab Results  Component Value Date   ALT 35 (H) 09/25/2022   AST 27 09/25/2022   ALKPHOS 114 11/27/2022   BILITOT 0.4 09/25/2022   Lab Results  Component Value Date   HGBA1C 6.7 11/27/2022   Lab Results  Component Value Date   INSULIN  14.9 02/11/2023   Lab Results  Component Value Date   TSH 2.73 11/27/2022   Lab Results  Component Value Date   CHOL 148 02/11/2023   HDL 69 02/11/2023   LDLCALC 57 02/11/2023   LDLDIRECT 115 (H) 09/03/2017   TRIG 127 02/11/2023   CHOLHDL 3.4 09/25/2022   Lab Results  Component Value Date   VD25OH 24.3 (L) 02/11/2023   Lab Results  Component Value  Date   WBC 5.8 11/27/2022   HGB 13.7 11/27/2022   HCT 41 11/27/2022   MCV 89.7 09/25/2022   PLT 194 11/27/2022   No results found for: IRON, TIBC, FERRITIN  Attestation Statements:   Reviewed by clinician on day of visit: allergies, medications, problem list, medical history, surgical history, family history, social history, and previous encounter notes.  Time spent on visit including pre-visit chart review and post-visit care and charting was 32 minutes.   I have reviewed the above documentation for accuracy and completeness, and I agree with the above. -  Joyia Riehle d. Christina Gintz, NP-C

## 2023-04-09 ENCOUNTER — Ambulatory Visit (INDEPENDENT_AMBULATORY_CARE_PROVIDER_SITE_OTHER): Payer: BC Managed Care – PPO | Admitting: Adult Health

## 2023-04-09 ENCOUNTER — Encounter (INDEPENDENT_AMBULATORY_CARE_PROVIDER_SITE_OTHER): Payer: Self-pay | Admitting: Adult Health

## 2023-04-09 VITALS — BP 113/77 | HR 93 | Temp 97.6°F | Ht 66.0 in | Wt 183.0 lb

## 2023-04-09 DIAGNOSIS — K5903 Drug induced constipation: Secondary | ICD-10-CM

## 2023-04-09 DIAGNOSIS — I10 Essential (primary) hypertension: Secondary | ICD-10-CM

## 2023-04-09 DIAGNOSIS — E785 Hyperlipidemia, unspecified: Secondary | ICD-10-CM

## 2023-04-09 DIAGNOSIS — E1169 Type 2 diabetes mellitus with other specified complication: Secondary | ICD-10-CM

## 2023-04-09 DIAGNOSIS — Z7985 Long-term (current) use of injectable non-insulin antidiabetic drugs: Secondary | ICD-10-CM

## 2023-04-09 DIAGNOSIS — I25118 Atherosclerotic heart disease of native coronary artery with other forms of angina pectoris: Secondary | ICD-10-CM

## 2023-04-09 DIAGNOSIS — Z6829 Body mass index (BMI) 29.0-29.9, adult: Secondary | ICD-10-CM

## 2023-04-09 DIAGNOSIS — E66811 Obesity, class 1: Secondary | ICD-10-CM

## 2023-04-09 DIAGNOSIS — E559 Vitamin D deficiency, unspecified: Secondary | ICD-10-CM | POA: Diagnosis not present

## 2023-04-09 MED ORDER — VITAMIN D (ERGOCALCIFEROL) 1.25 MG (50000 UNIT) PO CAPS
50000.0000 [IU] | ORAL_CAPSULE | ORAL | 0 refills | Status: DC
Start: 2023-04-09 — End: 2023-07-31

## 2023-04-09 NOTE — Progress Notes (Signed)
 WEIGHT SUMMARY AND BIOMETRICS  Vitals Temp: 97.6 F (36.4 C) BP: 113/77 Pulse Rate: 93 SpO2: 98 %   Anthropometric Measurements Height: 5\' 6"  (1.676 m) Weight: 183 lb (83 kg) BMI (Calculated): 29.55 Weight at Last Visit: 191 lb Weight Lost Since Last Visit: 8 lb Weight Gained Since Last Visit: 0 Starting Weight: 192 lb Total Weight Loss (lbs): 9 lb (4.082 kg)   Body Composition  Body Fat %: 36.6 % Fat Mass (lbs): 67.2 lbs Muscle Mass (lbs): 110.6 lbs Total Body Water (lbs): 76.2 lbs Visceral Fat Rating : 9   Other Clinical Data Fasting: no Labs: no Today's Visit #: 4 Starting Date: 02/11/23    Chief Complaint:   OBESITY Nancy Sparks is here to discuss her progress with her obesity treatment plan.  She is on the the Category 3 Plan/Weight Watchers [facility]) and states she is following her eating plan approximately 80 % of the time.  She states she is exercising Gym 45-60 minutes 5 times per week.   Interim History:  Reviewed Bioimpedance results with pt: Muscle Mass: +0.6 lb Adipose Mass: -8.2 lbs  She has slowly titrated up on Ozempic- has had 2 doses at 1mg  Denies mass in neck, dysphagia, dyspepsia, persistent hoarseness, abdominal pain, or N/V/C  She has remained off Metforming due to SE when taking  Subjective:   1. Vitamin D deficiency  Latest Reference Range & Units 02/11/23 09:27  Vitamin D, 25-Hydroxy 30.0 - 100.0 ng/mL 24.3 (L)  (L): Data is abnormally low  She is on weekly Ergocalciferol- denies N/V/Muscle Weakness  2. Essential hypertension BP improved and at goal She denies CP with exertion aspirin 81 MG EC tablet  Evolocumab (REPATHA SURECLICK) 140 MG/ML SOAJ  rosuvastatin (CRESTOR) 40 MG tablet  isosorbide mononitrate (IMDUR) 30 MG 24 hr tablet  amLODipine (NORVASC) 5 MG tablet  metoprolol tartrate (LOPRESSOR) 50 MG tablet   3. Type 2 diabetes mellitus with hyperlipidemia (HCC) Lab Results  Component Value Date   HGBA1C 6.7  11/27/2022    She was on GLP-1 therapy briefly in 2024 PCP recently started Ozempic She has titrated up to 1mg  once per week. First injection she experienced N/V approx 48 hrs after injection Second injection- no SE She has f/u with PCP next week   Of note- PCP also started her on Metformin- she only had a few doses and stopped due to SE She has remained off  4. Drug-induced constipation She will experience constipation every 3 days or so. She treats with OTC Colace She denies abdominal pain or hematochezia  5. Coronary artery disease of native artery of native heart with stable angina pectoris (HCC) aspirin 81 MG EC tablet  Evolocumab (REPATHA SURECLICK) 140 MG/ML SOAJ  rosuvastatin (CRESTOR) 40 MG tablet  isosorbide mononitrate (IMDUR) 30 MG 24 hr tablet  amLODipine (NORVASC) 5 MG tablet  metoprolol tartrate (LOPRESSOR) 50 MG tablet   She denies CP with exertion  Assessment/Plan:   1. Vitamin D deficiency Refill Vitamin D, Ergocalciferol, (DRISDOL) 1.25 MG (50000 UNIT) CAPS capsule Take 1 capsule (50,000 Units total) by mouth every 7 (seven) days. Dispense: 5 capsule, Refills: 0 ordered   2. Essential hypertension (Primary) Continue regular exercise and   3. Type 2 diabetes mellitus with hyperlipidemia (HCC) Continue Ozempic per PCP Discuss medication management at f/u next week  4. Drug-induced constipation Remain well hydrated Consume fiber rich foods Continue OTC Colace PRN  5. Coronary artery disease of native artery of native heart with stable  angina pectoris (HCC) Continue aspirin 81 MG EC tablet  Evolocumab (REPATHA SURECLICK) 140 MG/ML SOAJ  rosuvastatin (CRESTOR) 40 MG tablet  isosorbide mononitrate (IMDUR) 30 MG 24 hr tablet  amLODipine (NORVASC) 5 MG tablet  metoprolol tartrate (LOPRESSOR) 50 MG tablet    6. Class 1 obesity due to excess calories with serious comorbidity and body mass index (BMI) of 30.0 to 30.9 in adult, Current BMI 29.6  Nancy Sparks  is currently in the action stage of change. As such, her goal is to continue with weight loss efforts. She has agreed to the Category 3 Plan. / [employer]  Exercise goals: All adults should avoid inactivity. Some physical activity is better than none, and adults who participate in any amount of physical activity gain some health benefits. Adults should also include muscle-strengthening activities that involve all major muscle groups on 2 or more days a week.  Behavioral modification strategies: increasing lean protein intake, decreasing simple carbohydrates, increasing vegetables, increasing water intake, decreasing eating out, no skipping meals, meal planning and cooking strategies, keeping healthy foods in the home, and planning for success.  Nancy Sparks has agreed to follow-up with our clinic in 4 weeks. She was informed of the importance of frequent follow-up visits to maximize her success with intensive lifestyle modifications for her multiple health conditions.   Objective:   Blood pressure 113/77, pulse 93, temperature 97.6 F (36.4 C), height 5\' 6"  (1.676 m), weight 183 lb (83 kg), SpO2 98%. Body mass index is 29.54 kg/m.  General: Cooperative, alert, well developed, in no acute distress. HEENT: Conjunctivae and lids unremarkable. Cardiovascular: Regular rhythm.  Lungs: Normal work of breathing. Neurologic: No focal deficits.   Lab Results  Component Value Date   CREATININE 0.8 11/27/2022   BUN 20 11/27/2022   NA 141 11/27/2022   K 4.2 11/27/2022   CL 105 11/27/2022   CO2 25 09/25/2022   Lab Results  Component Value Date   ALT 35 (H) 09/25/2022   AST 27 09/25/2022   ALKPHOS 114 11/27/2022   BILITOT 0.4 09/25/2022   Lab Results  Component Value Date   HGBA1C 6.7 11/27/2022   Lab Results  Component Value Date   INSULIN 14.9 02/11/2023   Lab Results  Component Value Date   TSH 2.73 11/27/2022   Lab Results  Component Value Date   CHOL 148 02/11/2023   HDL 69 02/11/2023    LDLCALC 57 02/11/2023   LDLDIRECT 115 (H) 09/03/2017   TRIG 127 02/11/2023   CHOLHDL 3.4 09/25/2022   Lab Results  Component Value Date   VD25OH 24.3 (L) 02/11/2023   Lab Results  Component Value Date   WBC 5.8 11/27/2022   HGB 13.7 11/27/2022   HCT 41 11/27/2022   MCV 89.7 09/25/2022   PLT 194 11/27/2022   No results found for: "IRON", "TIBC", "FERRITIN"  Attestation Statements:   Reviewed by clinician on day of visit: allergies, medications, problem list, medical history, surgical history, family history, social history, and previous encounter notes.  I have reviewed the above documentation for accuracy and completeness, and I agree with the above. -  Breyanna Valera d. Torrian Canion, NP-C

## 2023-04-15 DIAGNOSIS — E119 Type 2 diabetes mellitus without complications: Secondary | ICD-10-CM | POA: Diagnosis not present

## 2023-04-15 DIAGNOSIS — I10 Essential (primary) hypertension: Secondary | ICD-10-CM | POA: Diagnosis not present

## 2023-04-23 ENCOUNTER — Ambulatory Visit (INDEPENDENT_AMBULATORY_CARE_PROVIDER_SITE_OTHER): Payer: BC Managed Care – PPO | Admitting: Adult Health

## 2023-04-23 ENCOUNTER — Encounter (INDEPENDENT_AMBULATORY_CARE_PROVIDER_SITE_OTHER): Payer: Self-pay | Admitting: Adult Health

## 2023-04-23 VITALS — BP 120/76 | HR 93 | Temp 97.4°F | Ht 66.0 in | Wt 183.0 lb

## 2023-04-23 DIAGNOSIS — I10 Essential (primary) hypertension: Secondary | ICD-10-CM

## 2023-04-23 DIAGNOSIS — E1169 Type 2 diabetes mellitus with other specified complication: Secondary | ICD-10-CM

## 2023-04-23 DIAGNOSIS — Z7985 Long-term (current) use of injectable non-insulin antidiabetic drugs: Secondary | ICD-10-CM

## 2023-04-23 DIAGNOSIS — E559 Vitamin D deficiency, unspecified: Secondary | ICD-10-CM | POA: Diagnosis not present

## 2023-04-23 DIAGNOSIS — I25118 Atherosclerotic heart disease of native coronary artery with other forms of angina pectoris: Secondary | ICD-10-CM

## 2023-04-23 DIAGNOSIS — E785 Hyperlipidemia, unspecified: Secondary | ICD-10-CM

## 2023-04-23 DIAGNOSIS — E6609 Other obesity due to excess calories: Secondary | ICD-10-CM

## 2023-04-23 DIAGNOSIS — E66811 Obesity, class 1: Secondary | ICD-10-CM

## 2023-04-23 DIAGNOSIS — Z6829 Body mass index (BMI) 29.0-29.9, adult: Secondary | ICD-10-CM

## 2023-04-23 NOTE — Progress Notes (Addendum)
 WEIGHT SUMMARY AND BIOMETRICS  Vitals Temp: (!) 97.4 F (36.3 C) BP: 120/76 Pulse Rate: 93 SpO2: 98 %   Anthropometric Measurements Height: 5' 6 (1.676 m) Weight: 183 lb (83 kg) BMI (Calculated): 29.55 Weight at Last Visit: 183 lb Weight Lost Since Last Visit: 0 lb Weight Gained Since Last Visit: 0 lb Starting Weight: 192 lb Total Weight Loss (lbs): 9 lb (4.082 kg) Waist Measurement : 42 inches   Body Composition  Body Fat %: 35.9 % Fat Mass (lbs): 66 lbs Muscle Mass (lbs): 111.6 lbs Total Body Water (lbs): 77.6 lbs Visceral Fat Rating : 9   Other Clinical Data Fasting: no Labs: no Today's Visit #: 5 Starting Date: 02/11/23    Chief Complaint:   OBESITY Nancy Sparks is here to discuss her progress with her obesity treatment plan.  She is on the the Category 3 Plan and states she is following her eating plan approximately 90 % of the time.  She states she is exercising Cardiovascular Exericse and Strength Training 60 minutes 5 times per week.   Interim History:  PCP is managing her Ozempic therapy-she was recently continued on weekly 1mg  She reports stable appetite and denies significant GI upset  She recently traveled to [place] for [employer] trip. She was at the [facility] for 5 days. She was unable walk on beach due to in climate weather.  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 Type 2 diabetes mellitus with hyperlipidemia (HCC) Lab Results  Component Value Date   HGBA1C 6.7 11/27/2022   She was on GLP-1 therapy briefly in 2024 PCP recently started Ozempic She has followed titration schedule and now on Ozempic 1mg  Denies mass in neck, dysphagia, dyspepsia, persistent hoarseness, abdominal pain, or N/V/Worsening Constipation  PCP also started her on Metformin- she only had a few doses and  stopped due to SE   2. Vitamin D  deficiency  Latest Reference Range & Units 02/11/23 09:27  Vitamin D , 25-Hydroxy 30.0 - 100.0 ng/mL 24.3 (L)  Vitamin B12 232 - 1,245 pg/mL 424  (L): Data is abnormally low  She is on weekly Ergocalciferol - denies N/V/Muscle Weakness  3. Essential hypertension BP at goal She denies CP with exertion She is currently on aspirin  81 MG EC tablet  Evolocumab  (REPATHA  SURECLICK) 140 MG/ML SOAJ  rosuvastatin  (CRESTOR ) 40 MG tablet  isosorbide  mononitrate (IMDUR ) 30 MG 24 hr tablet  amLODipine  (NORVASC ) 5 MG tablet  metoprolol  tartrate (LOPRESSOR ) 50 MG tablet   4. Coronary artery disease of native artery of native heart with stable angina pectoris (HCC) 10/16/2022 NM PET CT CARDIAC PERFUSION MULTI W/ABSOLUTE BLOODFLOW  IMPRESSION: No acute or clinically significant extracardiac findings.   Findings are consistent with a very small area of ischemia (corresponds to known small diagonal artery stenosis). The study is low risk.   LV perfusion is abnormal. There is evidence of ischemia. Defect 1: There is a small defect with mild reduction in uptake present in the basal anterolateral location(s) that is reversible. There is normal wall motion in the defect area. Consistent with ischemia. The defect is consistent with abnormal perfusion in the LAD-diagonal territory.   Rest left ventricular function is normal. Rest EF: 63%. Stress left ventricular function is normal. Stress EF: 68%. End diastolic cavity size is normal. End systolic cavity size is normal.  Myocardial blood flow was computed to be 0.5ml/g/min at rest and 2.51ml/g/min at stress. Global myocardial blood flow reserve was 3.50 and was normal.   Coronary calcium  assessment not performed due to prior revascularization.   Electronically signed by: Jerel Balding, MD  Assessment/Plan:   1. Type 2 diabetes mellitus with hyperlipidemia (HCC) Limit sugar/simple CHO Increase lean protein Continue weekly  Ozempic 1mg   2. Vitamin D  deficiency (Primary) Continue weekly Ergocalciferol - denies need for refill today  3. Essential hypertension Continue aspirin  81 MG EC tablet  Evolocumab  (REPATHA  SURECLICK) 140 MG/ML SOAJ  rosuvastatin  (CRESTOR ) 40 MG tablet  isosorbide  mononitrate (IMDUR ) 30 MG 24 hr tablet  amLODipine  (NORVASC ) 5 MG tablet  metoprolol  tartrate (LOPRESSOR ) 50 MG tablet   4. Coronary artery disease of native artery of native heart with stable angina pectoris (HCC) Continue healthy eating and regular cardiovascular exercise  5. Class 1 obesity due to excess calories with serious comorbidity and body mass index (BMI) of 30.0 to 30.9 in adult, Current BMI 29.6  Sharah is currently in the action stage of change. As such, her goal is to continue with weight loss efforts. She has agreed to the Category 3 Plan.   Exercise goals: For substantial health benefits, adults should do at least 150 minutes (2 hours and 30 minutes) a week of moderate-intensity, or 75 minutes (1 hour and 15 minutes) a week of vigorous-intensity aerobic physical activity, or an equivalent combination of moderate- and vigorous-intensity aerobic activity. Aerobic activity should be performed in episodes of at least 10 minutes, and preferably, it should be spread throughout the week.  Behavioral modification strategies: increasing lean protein intake, decreasing simple carbohydrates, increasing vegetables, increasing water intake, decreasing eating out, no skipping meals, meal planning and cooking strategies, keeping healthy foods in the home, and planning for success.  Kesi has agreed to follow-up with our clinic in 4 weeks. She was informed of the importance of frequent follow-up visits to maximize her success with intensive lifestyle modifications for her multiple health conditions.   Objective:   Blood pressure 120/76, pulse 93, temperature (!) 97.4 F (36.3 C), height 5' 6 (1.676 m), weight 183 lb (83 kg),  SpO2 98%. Body mass index is 29.54 kg/m.  General: Cooperative, alert, well developed, in no acute distress. HEENT: Conjunctivae and lids unremarkable. Cardiovascular: Regular rhythm.  Lungs: Normal work of breathing. Neurologic: No focal deficits.   Lab Results  Component Value Date   CREATININE 0.8 11/27/2022   BUN 20 11/27/2022   NA 141 11/27/2022   K 4.2 11/27/2022   CL 105 11/27/2022   CO2 25 09/25/2022   Lab Results  Component Value Date   ALT 35 (H) 09/25/2022   AST 27 09/25/2022   ALKPHOS 114 11/27/2022   BILITOT 0.4 09/25/2022   Lab Results  Component Value Date   HGBA1C 6.7 11/27/2022   Lab Results  Component Value Date   INSULIN  14.9 02/11/2023   Lab Results  Component Value Date   TSH 2.73 11/27/2022   Lab Results  Component Value Date   CHOL 148 02/11/2023   HDL 69 02/11/2023   LDLCALC 57 02/11/2023   LDLDIRECT 115 (H) 09/03/2017   TRIG 127 02/11/2023   CHOLHDL 3.4 09/25/2022   Lab Results  Component Value Date   VD25OH 24.3 (L) 02/11/2023   Lab Results  Component Value Date   WBC 5.8 11/27/2022   HGB 13.7 11/27/2022   HCT 41 11/27/2022   MCV 89.7 09/25/2022   PLT 194  11/27/2022   No results found for: IRON, TIBC, FERRITIN  Attestation Statements:   Reviewed by clinician on day of visit: allergies, medications, problem list, medical history, surgical history, family history, social history, and previous encounter notes.  Time spent on visit including pre-visit chart review and post-visit care and charting was 27 minutes.   I have reviewed the above documentation for accuracy and completeness, and I agree with the above. -  Alick Lecomte d. Satine Hausner, NP-C

## 2023-05-13 ENCOUNTER — Other Ambulatory Visit (INDEPENDENT_AMBULATORY_CARE_PROVIDER_SITE_OTHER): Payer: Self-pay | Admitting: Adult Health

## 2023-05-21 ENCOUNTER — Ambulatory Visit (INDEPENDENT_AMBULATORY_CARE_PROVIDER_SITE_OTHER): Admitting: Adult Health

## 2023-06-03 ENCOUNTER — Other Ambulatory Visit: Payer: Self-pay | Admitting: Cardiovascular Disease

## 2023-06-03 DIAGNOSIS — I25118 Atherosclerotic heart disease of native coronary artery with other forms of angina pectoris: Secondary | ICD-10-CM

## 2023-06-03 DIAGNOSIS — E7801 Familial hypercholesterolemia: Secondary | ICD-10-CM

## 2023-06-18 ENCOUNTER — Ambulatory Visit (INDEPENDENT_AMBULATORY_CARE_PROVIDER_SITE_OTHER): Admitting: Adult Health

## 2023-06-25 ENCOUNTER — Telehealth: Payer: Self-pay | Admitting: Pharmacy Technician

## 2023-06-25 ENCOUNTER — Other Ambulatory Visit (HOSPITAL_COMMUNITY): Payer: Self-pay

## 2023-06-25 NOTE — Telephone Encounter (Signed)
 Pharmacy Patient Advocate Encounter  Received notification from [facility] that Prior Authorization for REPATHA  has been APPROVED from 06/25/23 to 06/24/24   PA #/Case ID/Reference #: [id number]

## 2023-06-25 NOTE — Telephone Encounter (Signed)
 Pharmacy Patient Advocate Encounter   Received notification from CoverMyMeds that prior authorization for repatha  is required/requested.   Insurance verification completed.   The patient is insured through [facility] .   Per test claim: PA required; PA submitted to above mentioned insurance via CoverMyMeds Key/confirmation #/EOC [id number] Status is pending

## 2023-06-26 ENCOUNTER — Encounter (INDEPENDENT_AMBULATORY_CARE_PROVIDER_SITE_OTHER): Payer: Self-pay

## 2023-07-02 DIAGNOSIS — Z79890 Hormone replacement therapy (postmenopausal): Secondary | ICD-10-CM | POA: Diagnosis not present

## 2023-07-02 DIAGNOSIS — E7801 Familial hypercholesterolemia: Secondary | ICD-10-CM | POA: Diagnosis not present

## 2023-07-02 DIAGNOSIS — E119 Type 2 diabetes mellitus without complications: Secondary | ICD-10-CM | POA: Diagnosis not present

## 2023-07-11 ENCOUNTER — Other Ambulatory Visit: Payer: Self-pay | Admitting: Obstetrics & Gynecology

## 2023-07-11 DIAGNOSIS — G4700 Insomnia, unspecified: Secondary | ICD-10-CM

## 2023-07-14 NOTE — Telephone Encounter (Signed)
 Med refill request: Trazodone  Last AEX: 09/25/2022-[facility] Next AEX: nothing yet, recall sent per EMR. Msg sent to appt desk to go ahead and schedule.  Last MMG (if hormonal med): n/a Refill authorized: rx pend.

## 2023-07-31 ENCOUNTER — Ambulatory Visit (INDEPENDENT_AMBULATORY_CARE_PROVIDER_SITE_OTHER): Admitting: Adult Health

## 2023-07-31 ENCOUNTER — Encounter (INDEPENDENT_AMBULATORY_CARE_PROVIDER_SITE_OTHER): Payer: Self-pay | Admitting: Adult Health

## 2023-07-31 VITALS — BP 102/71 | HR 82 | Temp 97.8°F | Ht 66.0 in | Wt 176.0 lb

## 2023-07-31 DIAGNOSIS — Z6828 Body mass index (BMI) 28.0-28.9, adult: Secondary | ICD-10-CM

## 2023-07-31 DIAGNOSIS — E1169 Type 2 diabetes mellitus with other specified complication: Secondary | ICD-10-CM

## 2023-07-31 DIAGNOSIS — E785 Hyperlipidemia, unspecified: Secondary | ICD-10-CM

## 2023-07-31 DIAGNOSIS — I10 Essential (primary) hypertension: Secondary | ICD-10-CM | POA: Diagnosis not present

## 2023-07-31 DIAGNOSIS — Z7985 Long-term (current) use of injectable non-insulin antidiabetic drugs: Secondary | ICD-10-CM

## 2023-07-31 DIAGNOSIS — E559 Vitamin D deficiency, unspecified: Secondary | ICD-10-CM

## 2023-07-31 DIAGNOSIS — E6609 Other obesity due to excess calories: Secondary | ICD-10-CM

## 2023-07-31 DIAGNOSIS — E66811 Obesity, class 1: Secondary | ICD-10-CM

## 2023-07-31 MED ORDER — VITAMIN D (ERGOCALCIFEROL) 1.25 MG (50000 UNIT) PO CAPS: 50000.0000 [IU] | ORAL_CAPSULE | ORAL | 0 refills | Status: DC

## 2023-07-31 NOTE — Progress Notes (Signed)
 WEIGHT SUMMARY AND BIOMETRICS  Vitals Temp: 97.8 F (36.6 C) BP: 102/71 Pulse Rate: 82 SpO2: 97 %   Anthropometric Measurements Height: 5' 6 (1.676 m) Weight: 176 lb (79.8 kg) BMI (Calculated): 28.42 Weight at Last Visit: 183lb Weight Lost Since Last Visit: 7lb Weight Gained Since Last Visit: 0lb Starting Weight: 192lb Total Weight Loss (lbs): 16 lb (7.258 kg)   Body Composition  Body Fat %: 33.4 % Fat Mass (lbs): 58.8 lbs Muscle Mass (lbs): 111.4 lbs Total Body Water (lbs): 73.6 lbs Visceral Fat Rating : 8   Other Clinical Data Fasting: No Labs: no Today's Visit #: 6 Starting Date: 02/11/23 Comments: Cat 3    Chief Complaint:   OBESITY Nancy Sparks is here to discuss her progress with her obesity treatment plan.  She is on the the Category 3 Plan/Weight Watchers and states she is following her eating plan approximately 75 % of the time.  She states she is exercising PickleBall/Walking 45-60 minutes 5 times per week.   Interim History:  Last OV at [facility] was 04/23/2023 PCP is managing weekly Ozempic  She had to decreased from 1mg  to 0.5mg  end of March due to profound GI upset (N/V/Constipation). She has been maintained on the lowest Mx dose and tolerating well She is Metformin intolerant  Reviewed Bioimpedance results wit pt: Muscle Mass:- 0.2 lb Adipose Mass: -7.2 lbs  Subjective:   1. Type 2 diabetes mellitus with hyperlipidemia [facility]) Lab Results  Component Value Date   HGBA1C 6.7 11/27/2022    She is not checking home CBG, however denies sx's of hypogylcemia PCP is managing weekly Ozempic  She had to decreased from 1mg  to 0.5mg  end of March due to profound GI upset (N/V/Constipation). She has been maintained on the lowest Mx dose and tolerating well Denies mass in neck, dysphagia, dyspepsia, persistent hoarseness, abdominal pain, or N/V/C  She is Metformin intolerant  2. Vitamin D  deficiency  Latest Reference Range & Units 02/11/23 09:27   Vitamin D , 25-Hydroxy 30.0 - 100.0 ng/mL 24.3 (L)  (L): Data is abnormally low  PCP ran recent Vit D Level 07/02/2503 Vit D 32.3- well below goal of 50-70  She has missed a few weekly doses of Ergocalciferol   3. Essential hypertension BP at goal at OV She is able to either walk or play pickleball for 45-60 mins at leat5 x weekly She is currently on aspirin  81 MG EC tablet  rosuvastatin  (CRESTOR ) 40 MG tablet  isosorbide  mononitrate (IMDUR ) 30 MG 24 hr tablet  amLODipine  (NORVASC ) 5 MG tablet  metoprolol  tartrate (LOPRESSOR ) 50 MG tablet  Evolocumab  (REPATHA  SURECLICK) 140 MG/ML SOAJ   Assessment/Plan:   1. Type 2 diabetes mellitus with hyperlipidemia (HCC) (Primary) Continue healthy eating and regular cardiovascular exercise Continue weekly Ozempic 0.5mg - monitor for SE  2. Vitamin D  deficiency Refill Vitamin D , Ergocalciferol , (DRISDOL ) 1.25 MG (50000 UNIT) CAPS capsule Take 1 capsule (50,000 Units total) by mouth every 7 (seven) days. Dispense: 12 capsule, Refills: 0 ordered   3. Essential hypertension Continue healthy eating and regular cardiovascular exercise Continue aspirin  81 MG EC tablet  rosuvastatin  (CRESTOR ) 40 MG tablet  isosorbide  mononitrate (IMDUR ) 30 MG 24 hr tablet  amLODipine  (NORVASC ) 5 MG tablet  metoprolol  tartrate (LOPRESSOR ) 50 MG tablet  Evolocumab  (REPATHA  SURECLICK) 140 MG/ML SOAJ   4. Class 1 obesity due to excess calories with serious comorbidity and body mass index (BMI) of 30.0 to 30.9 in adult, Current BMI 28.4  Nancy Sparks is currently in the  action stage of change. As such, her goal is to continue with weight loss efforts. She has agreed to the Category 3 Plan./ Weight Watchers  Exercise goals: For substantial health benefits, adults should do at least 150 minutes (2 hours and 30 minutes) a week of moderate-intensity, or 75 minutes (1 hour and 15 minutes) a week of vigorous-intensity aerobic physical activity, or an equivalent combination of  moderate- and vigorous-intensity aerobic activity. Aerobic activity should be performed in episodes of at least 10 minutes, and preferably, it should be spread throughout the week.  Behavioral modification strategies: increasing lean protein intake, decreasing simple carbohydrates, increasing vegetables, increasing water intake, no skipping meals, meal planning and cooking strategies, keeping healthy foods in the home, ways to avoid boredom eating, and planning for success.  Nancy Sparks has agreed to follow-up with our clinic in 6 weeks. She was informed of the importance of frequent follow-up visits to maximize her success with intensive lifestyle modifications for her multiple health conditions.   Objective:   Blood pressure 102/71, pulse 82, temperature 97.8 F (36.6 C), height 5' 6 (1.676 m), weight 176 lb (79.8 kg), SpO2 97%. Body mass index is 28.41 kg/m.  General: Cooperative, alert, well developed, in no acute distress. HEENT: Conjunctivae and lids unremarkable. Cardiovascular: Regular rhythm.  Lungs: Normal work of breathing. Neurologic: No focal deficits.   Lab Results  Component Value Date   CREATININE 0.8 11/27/2022   BUN 20 11/27/2022   NA 141 11/27/2022   K 4.2 11/27/2022   CL 105 11/27/2022   CO2 25 09/25/2022   Lab Results  Component Value Date   ALT 35 (H) 09/25/2022   AST 27 09/25/2022   ALKPHOS 114 11/27/2022   BILITOT 0.4 09/25/2022   Lab Results  Component Value Date   HGBA1C 6.7 11/27/2022   Lab Results  Component Value Date   INSULIN  14.9 02/11/2023   Lab Results  Component Value Date   TSH 2.73 11/27/2022   Lab Results  Component Value Date   CHOL 148 02/11/2023   HDL 69 02/11/2023   LDLCALC 57 02/11/2023   LDLDIRECT 115 (H) 09/03/2017   TRIG 127 02/11/2023   CHOLHDL 3.4 09/25/2022   Lab Results  Component Value Date   VD25OH 24.3 (L) 02/11/2023   Lab Results  Component Value Date   WBC 5.8 11/27/2022   HGB 13.7 11/27/2022   HCT 41  11/27/2022   MCV 89.7 09/25/2022   PLT 194 11/27/2022   No results found for: IRON, TIBC, FERRITIN  Attestation Statements:   Reviewed by clinician on day of visit: allergies, medications, problem list, medical history, surgical history, family history, social history, and previous encounter notes.  I have reviewed the above documentation for accuracy and completeness, and I agree with the above. -  Brenda Samano d. Elida Harbin, NP-C

## 2023-08-04 DIAGNOSIS — H00021 Hordeolum internum right upper eyelid: Secondary | ICD-10-CM | POA: Diagnosis not present

## 2023-08-28 ENCOUNTER — Encounter: Payer: Self-pay | Admitting: Cardiovascular Disease

## 2023-08-31 ENCOUNTER — Telehealth: Admitting: Physician Assistant

## 2023-08-31 DIAGNOSIS — J069 Acute upper respiratory infection, unspecified: Secondary | ICD-10-CM | POA: Diagnosis not present

## 2023-08-31 DIAGNOSIS — J029 Acute pharyngitis, unspecified: Secondary | ICD-10-CM | POA: Diagnosis not present

## 2023-08-31 DIAGNOSIS — H6990 Unspecified Eustachian tube disorder, unspecified ear: Secondary | ICD-10-CM

## 2023-08-31 MED ORDER — LIDOCAINE VISCOUS HCL 2 % MT SOLN: 5.0000 mL | Freq: Three times a day (TID) | OROMUCOSAL | 0 refills | Status: AC

## 2023-08-31 MED ORDER — FLUTICASONE PROPIONATE 50 MCG/ACT NA SUSP: 2.0000 | Freq: Every day | NASAL | 6 refills | Status: AC

## 2023-08-31 MED ORDER — CETIRIZINE HCL 10 MG PO TABS: 10.0000 mg | ORAL_TABLET | Freq: Every day | ORAL | 0 refills | Status: AC

## 2023-08-31 NOTE — Progress Notes (Signed)
 Virtual Visit Consent   Nancy Sparks, you are scheduled for a virtual visit with a [facility] provider today. Just as with appointments in the office, your consent must be obtained to participate. Your consent will be active for this visit and any virtual visit you may have with one of our providers in the next 365 days. If you have a MyChart account, a copy of this consent can be sent to you electronically.  As this is a virtual visit, video technology does not allow for your provider to perform a traditional examination. This may limit your provider's ability to fully assess your condition. If your provider identifies any concerns that need to be evaluated in person or the need to arrange testing (such as labs, EKG, etc.), we will make arrangements to do so. Although advances in technology are sophisticated, we cannot ensure that it will always work on either your end or our end. If the connection with a video visit is poor, the visit may have to be switched to a telephone visit. With either a video or telephone visit, we are not always able to ensure that we have a secure connection.  By engaging in this virtual visit, you consent to the provision of healthcare and authorize for your insurance to be billed (if applicable) for the services provided during this visit. Depending on your insurance coverage, you may receive a charge related to this service.  I need to obtain your verbal consent now. Are you willing to proceed with your visit today? Nancy Sparks has provided verbal consent on 08/31/2023 for a virtual visit (video or telephone). Nancy Sparks, [place]  Date: 08/31/2023 12:57 PM   Virtual Visit via Video Note   I, Nancy Sparks, connected with  Nancy Sparks  ([id number], 17-Aug-1965) on 08/31/23 at 12:45 PM EDT by a video-enabled telemedicine application and verified that I am speaking with the correct person using two identifiers.  Location: Patient: Virtual Visit Location Patient:  Home Provider: Virtual Visit Location Provider: Home Office   I discussed the limitations of evaluation and management by telemedicine and the availability of in person appointments. The patient expressed understanding and agreed to proceed.    History of Present Illness: Nancy Sparks is a 57 y.o. who identifies as a female who was assigned female at birth, and is being seen today for URI.  HPI: Ear Fullness  There is pain in both ears. The current episode started today. The problem has been unchanged. Associated symptoms include a sore throat. She has tried acetaminophen  for the symptoms. The treatment provided mild relief.    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   Pain in joint of right ankle 10/15/2017   Anxiety disorder 08/12/2017   Coronary artery disease involving native coronary artery with angina pectoris (HCC)    HLD (hyperlipidemia) 07/24/2017   Benign neoplasm of colon 07/15/2017   Family history of malignant neoplasm of gastrointestinal tract 07/15/2017   Gastro-esophageal reflux disease without esophagitis 07/15/2017   History of gestational diabetes mellitus 07/15/2017   Hormone replacement therapy 07/15/2017   Impaired fasting glucose 07/15/2017   Overweight 07/15/2017   Libido, decreased 01/01/2016   Hot flashes 01/01/2016  Insomnia 01/01/2016   Essential hypertension 01/03/2015   Family history of colon cancer - sister 04/24/2014   OAB (overactive bladder) 11/04/2013   Heart palpitations    Hx of adenomatous polyp of colon 12/24/2002    Allergies:  Allergies  Allergen Reactions   Latex Rash   Tape Rash    Paper tape ok    Tapentadol Rash    Paper tape ok   Medications:  Current Outpatient Medications:    cetirizine  (ZYRTEC  ALLERGY) 10 MG tablet, Take 1 tablet (10 mg total) by mouth daily for 14 days., Disp: 14 tablet, Rfl: 0   fluticasone  (FLONASE ) 50 MCG/ACT nasal spray, Place 2 sprays into both nostrils daily., Disp: 16 g, Rfl: 6   magic mouthwash (lidocaine , diphenhydrAMINE , alum & mag hydroxide) suspension, Swish and spit 5 mLs 3 (three) times daily for 5 days., Disp: 75 mL, Rfl: 0   ALPRAZolam  (XANAX ) 0.5 MG tablet, as needed., Disp: , Rfl:    amLODipine  (NORVASC ) 5 MG tablet, Take 1 tablet (5 mg total) by mouth daily. Please keep September appointment for further refills, Disp: 90 tablet, Rfl: 3   aspirin  81 MG EC tablet, Take 1 tablet (81 mg total) by mouth daily., Disp: 90 tablet, Rfl: 3   esomeprazole (NEXIUM) 20 MG capsule, 20 mg as needed., Disp: , Rfl:    Evolocumab  (REPATHA  SURECLICK) 140 MG/ML SOAJ, INJECT 1 PEN EVERY 14 DAYS SUBCUTANEOUS, Disp: 6 mL, Rfl: 3   isosorbide  mononitrate (IMDUR ) 30 MG 24 hr tablet, Take 1 tablet (30 mg total) by mouth daily. Please keep September appointment for further refills, Disp: 90 tablet, Rfl: 3   metoprolol  tartrate (LOPRESSOR ) 50 MG tablet, Take 1 tablet (50 mg total) by mouth 2 (two) times daily., Disp: 180 tablet, Rfl: 3   nitroGLYCERIN  (NITROSTAT ) 0.4 MG SL tablet, Place 1 tablet (0.4 mg total) under the tongue every 5 (five) minutes as needed for chest pain. (Patient not taking: Reported on 07/31/2023), Disp: 90 tablet, Rfl: 3   rosuvastatin  (CRESTOR ) 40 MG tablet, TAKE 1 TABLET(40 MG) BY MOUTH AT BEDTIME, Disp: 90 tablet, Rfl: 3   Semaglutide  (OZEMPIC, 0.25 OR 0.5 MG/DOSE, [place]), Inject 0.5 mg into the skin once a week., Disp: , Rfl:    traZODone  (DESYREL ) 150 MG tablet, Take 1 tablet (150 mg total) by mouth at bedtime., Disp: 90 tablet, Rfl: 3   Vitamin D , Ergocalciferol , (DRISDOL ) 1.25 MG (50000 UNIT) CAPS capsule, Take 1 capsule (50,000 Units total) by mouth  every 7 (seven) days., Disp: 12 capsule, Rfl: 0  Observations/Objective: Patient is well-developed, well-nourished in no acute distress.  Resting comfortably  at home.  Head is normocephalic, atraumatic.  No labored breathing.  Speech is clear and coherent with logical content.  Patient is alert and oriented at baseline.    Assessment and Plan: 1. Disorder of Eustachian tube, unspecified laterality (Primary)  2. Upper respiratory tract infection, unspecified type  Patients present symptoms suspicious for URI with Eustachian tube dysfunction. Differentials include allergic rhinitis,  bacterial pneumonia, sinusitis. Do not suspect underlying cardiopulmonary process. I considered, but think unlikely, dangerous causes of this patient's symptoms to include ACS, CHF or pneumonia, pneumothorax. Patient is nontoxic appearing and not in need of emergent medical intervention.  Plan: reassurance, reassessment, over the counter medications, discharge with PCP follow-up  Follow Up Instructions: I discussed the assessment and treatment plan with the patient. The patient was provided an opportunity to ask questions and all were answered. The patient agreed with the plan  and demonstrated an understanding of the instructions.  A copy of instructions were sent to the patient via MyChart unless otherwise noted below.    The patient was advised to call back or seek an in-person evaluation if the symptoms worsen or if the condition fails to improve as anticipated.    Nancy Shuck, PA-C

## 2023-08-31 NOTE — Patient Instructions (Signed)
 Nancy Sparks, thank you for joining Teena Shuck, PA-C for today's virtual visit.  While this provider is not your primary care provider (PCP), if your PCP is located in our provider database this encounter information will be shared with them immediately following your visit.   A [place] MyChart account gives you access to today's visit and all your visits, tests, and labs performed at [facility]  click here if you don't have a [facility] MyChart account or go to [email or web address]  Consent: (Patient) Nancy Sparks provided verbal consent for this virtual visit at the beginning of the encounter.  Current Medications:  Current Outpatient Medications:    cetirizine  (ZYRTEC  ALLERGY) 10 MG tablet, Take 1 tablet (10 mg total) by mouth daily for 14 days., Disp: 14 tablet, Rfl: 0   fluticasone  (FLONASE ) 50 MCG/ACT nasal spray, Place 2 sprays into both nostrils daily., Disp: 16 g, Rfl: 6   magic mouthwash (lidocaine , diphenhydrAMINE , alum & mag hydroxide) suspension, Swish and spit 5 mLs 3 (three) times daily for 5 days., Disp: 75 mL, Rfl: 0   ALPRAZolam  (XANAX ) 0.5 MG tablet, as needed., Disp: , Rfl:    amLODipine  (NORVASC ) 5 MG tablet, Take 1 tablet (5 mg total) by mouth daily. Please keep September appointment for further refills, Disp: 90 tablet, Rfl: 3   aspirin  81 MG EC tablet, Take 1 tablet (81 mg total) by mouth daily., Disp: 90 tablet, Rfl: 3   esomeprazole (NEXIUM) 20 MG capsule, 20 mg as needed., Disp: , Rfl:    Evolocumab  (REPATHA  SURECLICK) 140 MG/ML SOAJ, INJECT 1 PEN EVERY 14 DAYS SUBCUTANEOUS, Disp: 6 mL, Rfl: 3   isosorbide  mononitrate (IMDUR ) 30 MG 24 hr tablet, Take 1 tablet (30 mg total) by mouth daily. Please keep September appointment for further refills, Disp: 90 tablet, Rfl: 3   metoprolol  tartrate (LOPRESSOR ) 50 MG tablet, Take 1 tablet (50 mg total) by mouth 2 (two) times daily., Disp: 180 tablet, Rfl: 3   nitroGLYCERIN  (NITROSTAT ) 0.4 MG SL  tablet, Place 1 tablet (0.4 mg total) under the tongue every 5 (five) minutes as needed for chest pain. (Patient not taking: Reported on 07/31/2023), Disp: 90 tablet, Rfl: 3   rosuvastatin  (CRESTOR ) 40 MG tablet, TAKE 1 TABLET(40 MG) BY MOUTH AT BEDTIME, Disp: 90 tablet, Rfl: 3   Semaglutide  (OZEMPIC, 0.25 OR 0.5 MG/DOSE, [place]), Inject 0.5 mg into the skin once a week., Disp: , Rfl:    traZODone  (DESYREL ) 150 MG tablet, Take 1 tablet (150 mg total) by mouth at bedtime., Disp: 90 tablet, Rfl: 3   Vitamin D , Ergocalciferol , (DRISDOL ) 1.25 MG (50000 UNIT) CAPS capsule, Take 1 capsule (50,000 Units total) by mouth every 7 (seven) days., Disp: 12 capsule, Rfl: 0   Medications ordered in this encounter:  Meds ordered this encounter  Medications   magic mouthwash (lidocaine , diphenhydrAMINE , alum & mag hydroxide) suspension    Sig: Swish and spit 5 mLs 3 (three) times daily for 5 days.    Dispense:  75 mL    Refill:  0    Supervising Provider:   LAMPTEY, PHILIP O [id number]   fluticasone  (FLONASE ) 50 MCG/ACT nasal spray    Sig: Place 2 sprays into both nostrils daily.    Dispense:  16 g    Refill:  6    Supervising Provider:   BLAISE ALEENE KIDD [[id number]]   cetirizine  (ZYRTEC  ALLERGY) 10 MG tablet    Sig: Take 1 tablet (10 mg total) by mouth daily for  14 days.    Dispense:  14 tablet    Refill:  0    Supervising Provider:   BLAISE ALEENE KIDD [[id number]]     *If you need refills on other medications prior to your next appointment, please contact your pharmacy*  Follow-Up: Call back or seek an in-person evaluation if the symptoms worsen or if the condition fails to improve as anticipated.  [facility] [phone]  Other Instructions Please report to the nearest Emergency room with any worsening symptoms. Follow up with primary care provider (PCP) in 2 -3 days.    If you have been instructed to have an in-person evaluation today at a local Urgent Care facility, please use the link  below. It will take you to a list of all of our available [facility], including address, phone number and hours of operation. Please do not delay care.  [facility]  If you or a family member do not have a primary care provider, use the link below to schedule a visit and establish care. When you choose a [facility] [place] primary care physician or advanced practice provider, you gain a long-term partner in health. Find a Primary Care Provider  Learn more about [facility] in-office and virtual care options: [facility] - Get Care Now

## 2023-09-09 ENCOUNTER — Ambulatory Visit (INDEPENDENT_AMBULATORY_CARE_PROVIDER_SITE_OTHER): Admitting: Adult Health

## 2023-09-17 ENCOUNTER — Ambulatory Visit (INDEPENDENT_AMBULATORY_CARE_PROVIDER_SITE_OTHER): Admitting: Adult Health

## 2023-09-26 ENCOUNTER — Ambulatory Visit: Admitting: Radiology

## 2023-10-01 ENCOUNTER — Ambulatory Visit: Admitting: Radiology

## 2023-10-07 ENCOUNTER — Other Ambulatory Visit: Payer: Self-pay | Admitting: Cardiovascular Disease

## 2023-10-16 ENCOUNTER — Ambulatory Visit (INDEPENDENT_AMBULATORY_CARE_PROVIDER_SITE_OTHER): Admitting: Adult Health

## 2023-10-16 ENCOUNTER — Encounter (INDEPENDENT_AMBULATORY_CARE_PROVIDER_SITE_OTHER): Payer: Self-pay | Admitting: Adult Health

## 2023-10-16 VITALS — BP 138/88 | HR 82 | Temp 98.0°F | Ht 66.0 in | Wt 181.0 lb

## 2023-10-16 DIAGNOSIS — E785 Hyperlipidemia, unspecified: Secondary | ICD-10-CM | POA: Diagnosis not present

## 2023-10-16 DIAGNOSIS — E1169 Type 2 diabetes mellitus with other specified complication: Secondary | ICD-10-CM | POA: Diagnosis not present

## 2023-10-16 DIAGNOSIS — F419 Anxiety disorder, unspecified: Secondary | ICD-10-CM

## 2023-10-16 DIAGNOSIS — I10 Essential (primary) hypertension: Secondary | ICD-10-CM | POA: Diagnosis not present

## 2023-10-16 DIAGNOSIS — E66811 Obesity, class 1: Secondary | ICD-10-CM

## 2023-10-16 DIAGNOSIS — Z7985 Long-term (current) use of injectable non-insulin antidiabetic drugs: Secondary | ICD-10-CM

## 2023-10-16 DIAGNOSIS — E559 Vitamin D deficiency, unspecified: Secondary | ICD-10-CM

## 2023-10-16 DIAGNOSIS — Z6829 Body mass index (BMI) 29.0-29.9, adult: Secondary | ICD-10-CM

## 2023-10-16 MED ORDER — ALPRAZOLAM 0.5 MG PO TABS: ORAL_TABLET | ORAL | 0 refills | Status: AC

## 2023-10-16 MED ORDER — VITAMIN D (ERGOCALCIFEROL) 1.25 MG (50000 UNIT) PO CAPS: 50000.0000 [IU] | ORAL_CAPSULE | ORAL | 0 refills | Status: AC
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
## 2023-10-17 ENCOUNTER — Encounter (INDEPENDENT_AMBULATORY_CARE_PROVIDER_SITE_OTHER): Payer: Self-pay | Admitting: Adult Health

## 2023-10-20 ENCOUNTER — Encounter (INDEPENDENT_AMBULATORY_CARE_PROVIDER_SITE_OTHER): Payer: Self-pay | Admitting: Adult Health

## 2023-10-22 ENCOUNTER — Other Ambulatory Visit: Payer: Self-pay | Admitting: Cardiovascular Disease

## 2023-10-22 MED ORDER — ASPIRIN 81 MG PO TBEC: 81.0000 mg | DELAYED_RELEASE_TABLET | Freq: Every day | ORAL | 3 refills | Status: AC

## 2023-10-24 ENCOUNTER — Other Ambulatory Visit: Payer: Self-pay | Admitting: Cardiovascular Disease

## 2023-10-24 ENCOUNTER — Other Ambulatory Visit: Payer: Self-pay

## 2023-10-24 MED ORDER — ISOSORBIDE MONONITRATE ER 30 MG PO TB24: 30.0000 mg | ORAL_TABLET | Freq: Every day | ORAL | 0 refills | Status: DC

## 2023-11-05 ENCOUNTER — Other Ambulatory Visit (HOSPITAL_COMMUNITY)
Admission: RE | Admit: 2023-11-05 | Discharge: 2023-11-05 | Disposition: A | Discharge status: Home / Self Care | Source: Ambulatory Visit | Attending: Radiology | Admitting: Radiology

## 2023-11-05 ENCOUNTER — Ambulatory Visit: Admitting: Radiology

## 2023-11-05 ENCOUNTER — Encounter: Payer: Self-pay | Admitting: Radiology

## 2023-11-05 VITALS — BP 112/74 | HR 84 | Ht 66.0 in | Wt 185.0 lb

## 2023-11-05 DIAGNOSIS — G4700 Insomnia, unspecified: Secondary | ICD-10-CM

## 2023-11-05 DIAGNOSIS — Z01419 Encounter for gynecological examination (general) (routine) without abnormal findings: Secondary | ICD-10-CM | POA: Insufficient documentation

## 2023-11-05 DIAGNOSIS — Z1331 Encounter for screening for depression: Secondary | ICD-10-CM

## 2023-11-05 MED ORDER — TRAZODONE HCL 150 MG PO TABS: 150.0000 mg | ORAL_TABLET | Freq: Every day | ORAL | 3 refills | Status: AC

## 2023-11-05 NOTE — Progress Notes (Signed)
   Nancy Sparks Jul 30, 1965 [id number]   History: Postmenopausal 58 y.o. presents for annual exam. Doing well, no gyn concerns. Needs a refill on trazodone  for sleep.    Gynecologic History Postmenopausal Last Pap: 2022. Results were: normal Last mammogram: 08/13/22. Results were: normal Last colonoscopy: 2022   Obstetric History OB History  Gravida Para Term Preterm AB Living  4 3 3  1 3   SAB IAB Ectopic Multiple Live Births  1        # Outcome Date GA Lbr Len/2nd Weight Sex Type Anes PTL Lv  4 SAB           3 Term           2 Term           1 Term                11/05/2023    3:29 PM 10/01/2017    3:48 PM  Depression screen PHQ 2/9  Decreased Interest 0 0  Down, Depressed, Hopeless 0 0  PHQ - 2 Score 0 0     The following portions of the patient's history were reviewed and updated as appropriate: allergies, current medications, past family history, past medical history, past social history, past surgical history, and problem list.  Review of Systems Pertinent items noted in HPI and remainder of comprehensive ROS otherwise negative.  Past medical history, past surgical history, family history and social history were all reviewed and documented in the EPIC chart.  Exam:  Vitals:   11/05/23 1528  BP: 112/74  Pulse: 84  SpO2: 97%  Weight: 185 lb (83.9 kg)  Height: 5' 6 (1.676 m)   Body mass index is 29.86 kg/m.  General appearance:  Normal Thyroid :  Symmetrical, normal in size, without palpable masses or nodularity. Respiratory  Auscultation:  Clear without wheezing or rhonchi Cardiovascular  Auscultation:  Regular rate, without rubs, murmurs or gallops  Edema/varicosities:  Not grossly evident Abdominal  Soft,nontender, without masses, guarding or rebound.  Liver/spleen:  No organomegaly noted  Hernia:  None appreciated  Skin  Inspection:  Grossly normal Breasts: Examined lying and sitting.   Right: Without masses, retractions, nipple discharge or  axillary adenopathy.   Left: Without masses, retractions, nipple discharge or axillary adenopathy. Genitourinary   Inguinal/mons:  Normal without inguinal adenopathy  External genitalia:  Normal appearing vulva with no masses, tenderness, or lesions  BUS/Urethra/Skene's glands:  Normal  Vagina:  Normal appearing with normal color and discharge, no lesions. Atrophy: mild   Cervix:  Normal appearing without discharge or lesions  Uterus:  Normal in size, shape and contour.  Midline and mobile, nontender  Adnexa/parametria:     Rt: Normal in size, without masses or tenderness.   Lt: Normal in size, without masses or tenderness.  Anus and perineum: Normal    Nancy Sparks, CMA present for exam  Assessment/Plan:   1. Well woman exam with routine gynecological exam (Primary) - Cytology - PAP( [place])  2. Insomnia, unspecified type - traZODone  (DESYREL ) 150 MG tablet; Take 1 tablet (150 mg total) by mouth at bedtime.  Dispense: 90 tablet; Refill: 3  3. Depression screening   Return in 1 year for annual or sooner prn.  Nancy Sparks B WHNP-BC, 3:50 PM 11/05/2023

## 2023-11-07 ENCOUNTER — Ambulatory Visit: Payer: Self-pay | Admitting: Radiology

## 2023-11-07 LAB — CYTOLOGY - PAP
Comment: NEGATIVE
Diagnosis: NEGATIVE
High risk HPV: NEGATIVE

## 2023-11-08 IMAGING — MG MM DIGITAL SCREENING BILAT W/ TOMO AND CAD
8 series · 8 of 24 positions shown · non-contrast
Comparison: Previous exam(s).

CLINICAL DATA: Screening.

EXAM:
DIGITAL SCREENING BILATERAL MAMMOGRAM WITH TOMOSYNTHESIS AND CAD
TECHNIQUE: Bilateral screening digital craniocaudal and mediolateral oblique
mammograms were obtained. Bilateral screening digital breast
tomosynthesis was performed. The images were evaluated with
computer-aided detection.

[R CC synth-2D]
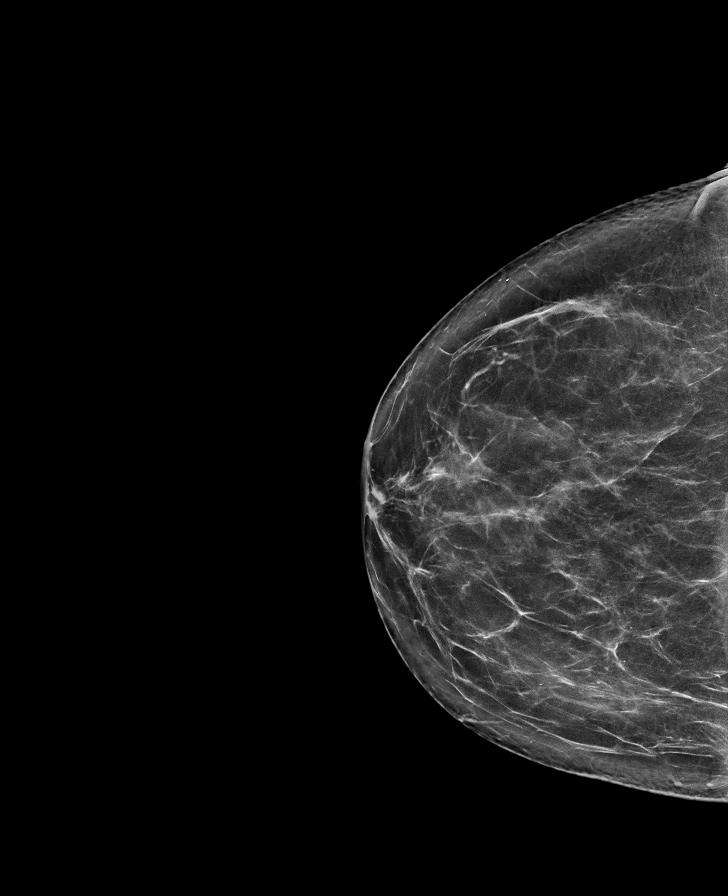

[L MLO synth-2D]
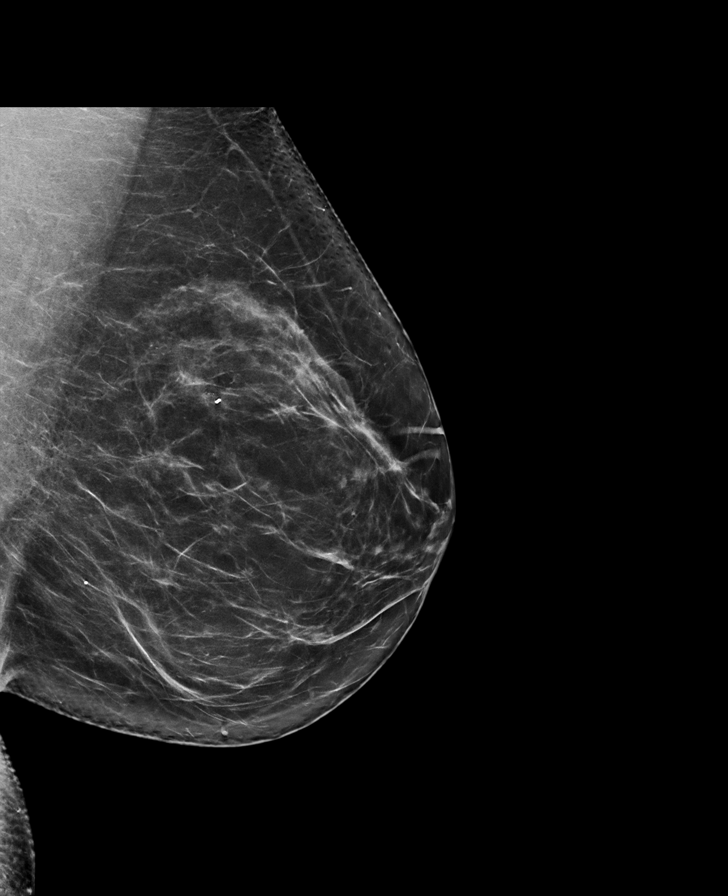

[L CC synth-2D]
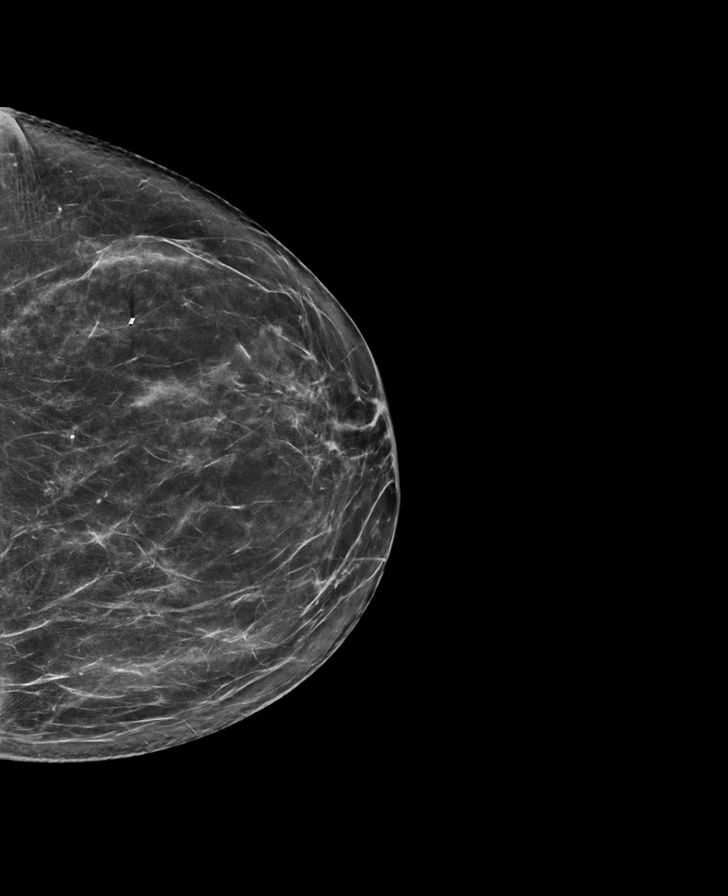

[R MLO synth-2D]
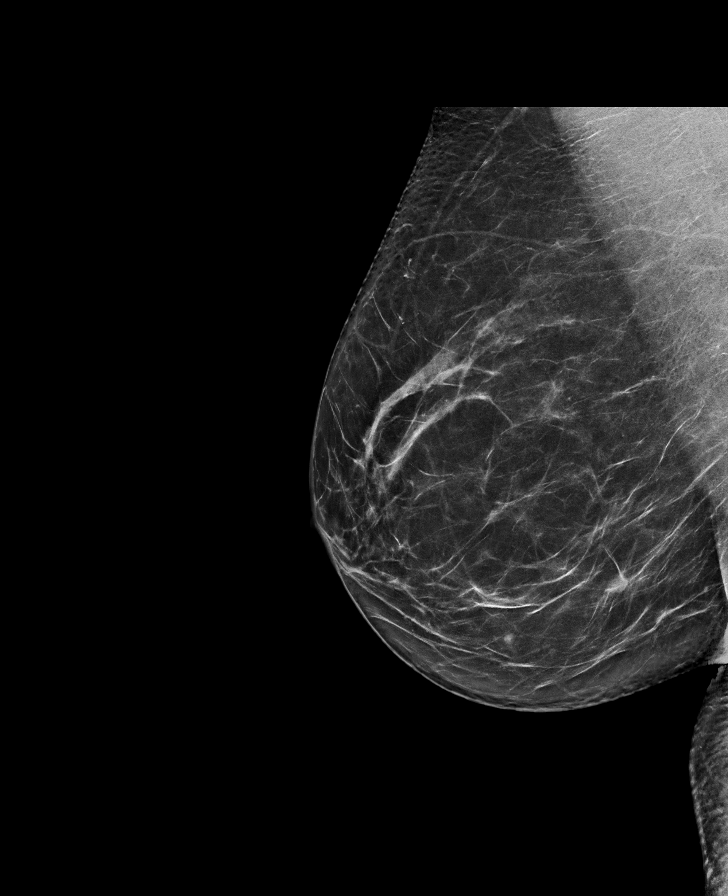

[R MLO tomo · tomo slice 43/84.0]
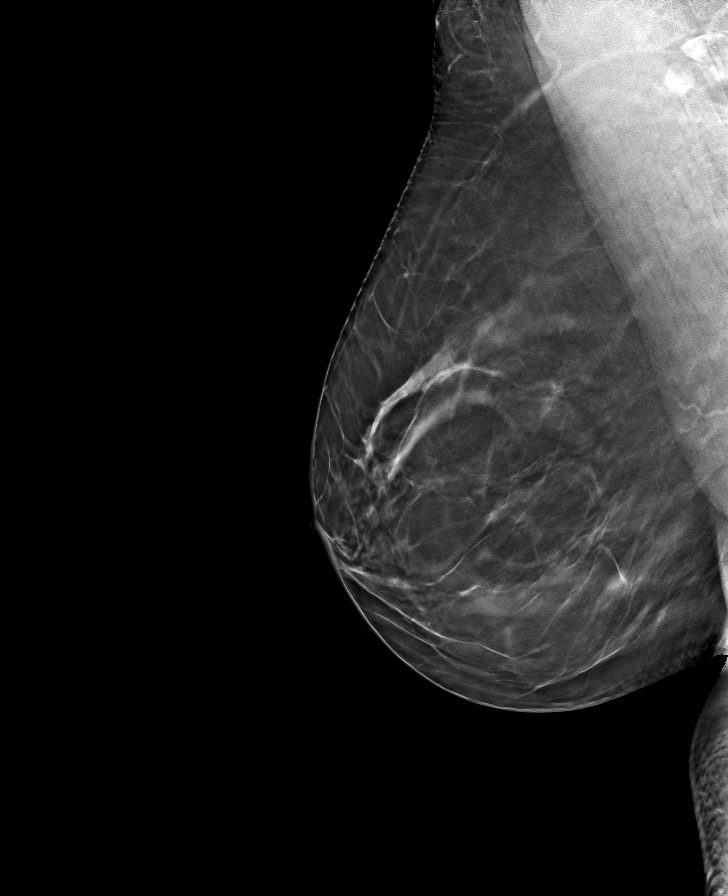

[R CC tomo · tomo slice 41/81.0]
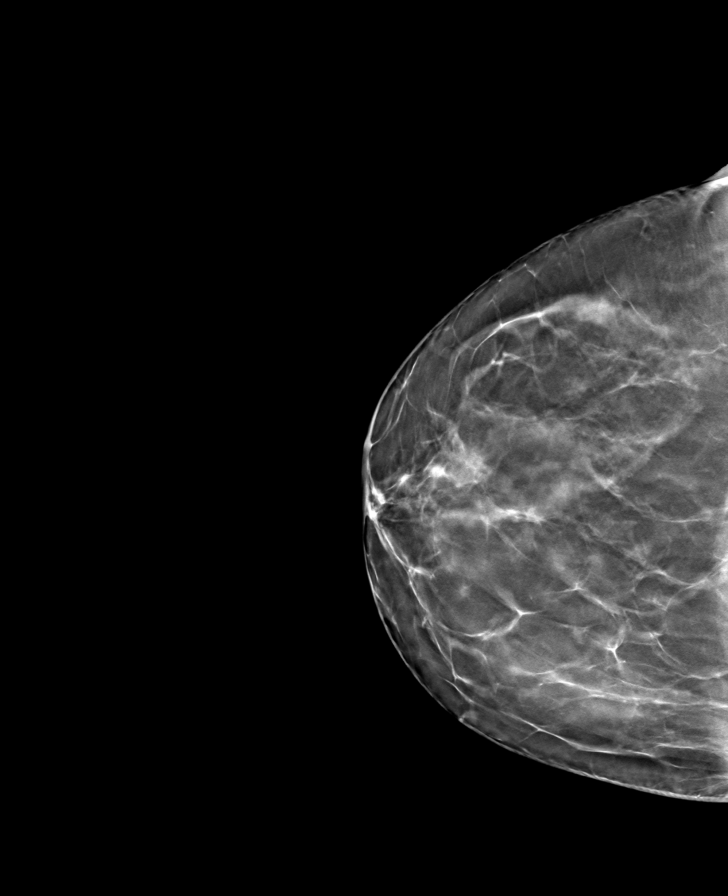

[L CC tomo · tomo slice 40/79.0]
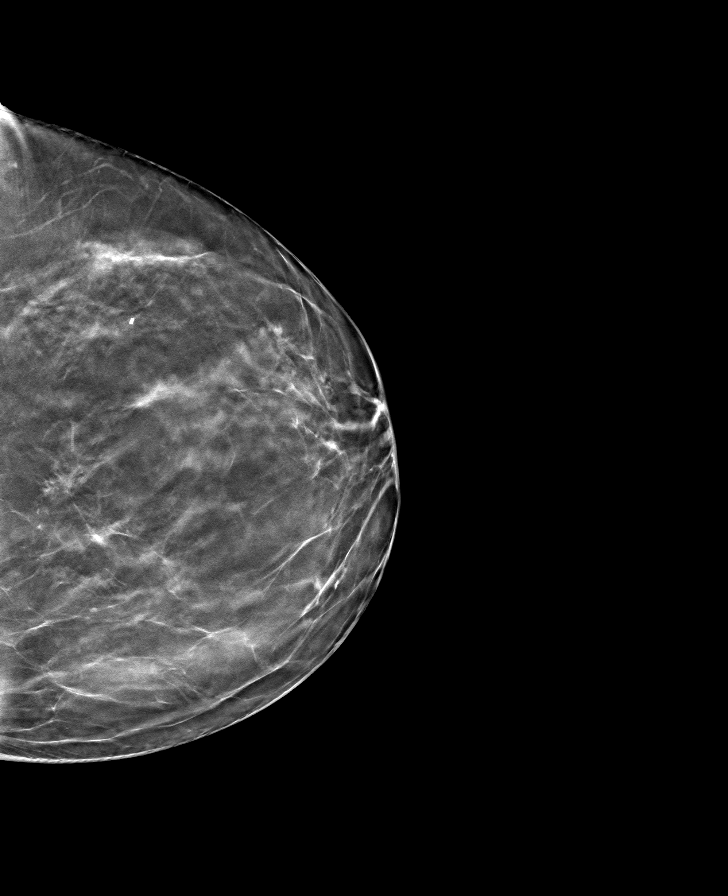

[L MLO tomo · tomo slice 42/83.0]
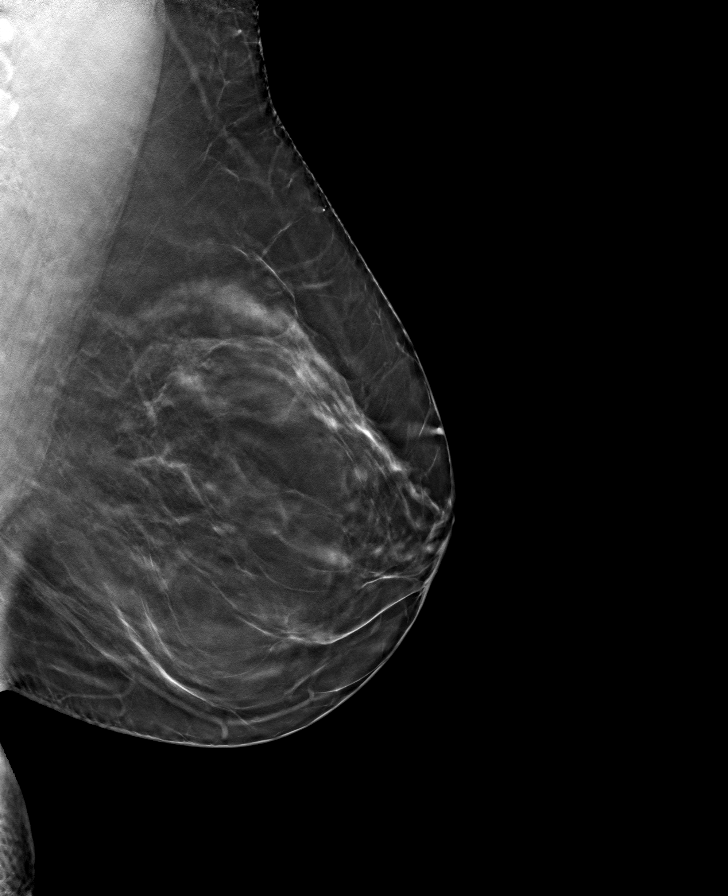

[8 of 24 positions shown; findings below may reference images not displayed]

ACR Breast Density Category b: There are scattered areas of
fibroglandular density.
FINDINGS: There are no findings suspicious for malignancy.
IMPRESSION: No mammographic evidence of malignancy. A result letter of this
screening mammogram will be mailed directly to the patient.

RECOMMENDATION:
Screening mammogram in one year. (Code:51-O-LD2)

BI-RADS CATEGORY  1: Negative.

## 2023-11-13 ENCOUNTER — Other Ambulatory Visit: Payer: Self-pay | Admitting: Cardiovascular Disease

## 2023-11-26 ENCOUNTER — Ambulatory Visit (INDEPENDENT_AMBULATORY_CARE_PROVIDER_SITE_OTHER): Admitting: Adult Health

## 2023-11-26 ENCOUNTER — Encounter (INDEPENDENT_AMBULATORY_CARE_PROVIDER_SITE_OTHER): Payer: Self-pay | Admitting: Adult Health

## 2023-11-26 VITALS — BP 114/73 | HR 78 | Temp 98.8°F | Ht 66.0 in | Wt 184.0 lb

## 2023-11-26 DIAGNOSIS — E1169 Type 2 diabetes mellitus with other specified complication: Secondary | ICD-10-CM

## 2023-11-26 DIAGNOSIS — Z7985 Long-term (current) use of injectable non-insulin antidiabetic drugs: Secondary | ICD-10-CM

## 2023-11-26 DIAGNOSIS — E669 Obesity, unspecified: Secondary | ICD-10-CM

## 2023-11-26 DIAGNOSIS — E6609 Other obesity due to excess calories: Secondary | ICD-10-CM

## 2023-11-26 DIAGNOSIS — E559 Vitamin D deficiency, unspecified: Secondary | ICD-10-CM | POA: Diagnosis not present

## 2023-11-26 DIAGNOSIS — F419 Anxiety disorder, unspecified: Secondary | ICD-10-CM | POA: Diagnosis not present

## 2023-11-26 DIAGNOSIS — Z6829 Body mass index (BMI) 29.0-29.9, adult: Secondary | ICD-10-CM

## 2023-11-26 NOTE — Progress Notes (Signed)
 WEIGHT SUMMARY AND BIOMETRICS  Vitals Temp: 98.8 F (37.1 C) BP: 114/73 Pulse Rate: 78 SpO2: 98 %   Anthropometric Measurements Height: 5' 6 (1.676 m) Weight: 184 lb (83.5 kg) BMI (Calculated): 29.71 Weight at Last Visit: 181lb Weight Lost Since Last Visit: 0lb Weight Gained Since Last Visit: 3lb Starting Weight: 192lb Total Weight Loss (lbs): 9 lb (4.082 kg)   Body Composition  Body Fat %: 36.5 % Fat Mass (lbs): 67.4 lbs Muscle Mass (lbs): 111 lbs Total Body Water (lbs): 77.8 lbs Visceral Fat Rating : 9   Other Clinical Data Fasting: No Labs: no Today's Visit #: 8 Starting Date: 02/11/23    Chief Complaint:   OBESITY Nancy Sparks is here to discuss her progress with her obesity treatment plan.  She is on the Weight Watchers 20-30 Daily Points  and states she is following her eating plan approximately 60 % of the time.  She states she is exercising: None, r/t recent R Hamstring Strain   Interim History:   Her son's wedding was a joyous event without any family drama.  Reviewed Bioimpedance Results Muscle Mass: +0.4 lb Adipose Mass: +2.4 lbs  She has been following Weight Watchers Program with 20-30 Daily Points- bored with this program. She would like to convert back her prescribed MP, which was Cat 3  She would like information on Nancy Sparks Classes.  Subjective:   1. Type 2 diabetes mellitus with hyperlipidemia (HCC) PCP manages weekly Ozempic 0.5mg  She increased to 0.75mg  for on dose and experienced one morning of N/V- however the night prior she attended Nancy Sparks and ate heavier and drank more wine than usual for her. She has upcoming f/u with her established PCP/Nancy Sparks soon- she will discuss dosing options with him at that OV. Currently, she denies mass in neck, dysphagia, dyspepsia, persistent hoarseness, abdominal pain, or N/V/C   2. Vitamin D  deficiency  Latest Reference Range & Units 02/11/23 09:27  Vitamin D ,  25-Hydroxy 30.0 - 100.0 ng/mL 24.3 (L)  (L): Data is abnormally low  She endorses stable energy levels. She is glad that her son's wedding and all the events are completed. She will [place] to [place] and is quite [place]  3. Anxiety PDMP Reviewed- No Aberrancies Noted She only required one dose of PRN Alprazolam  to quiet anxiety enough for her to sleep a solid nights rest. She reports much lower levels of stress and anxiety. She appears well rested, content, and calm at OV today  Assessment/Plan:   1. Type 2 diabetes mellitus with hyperlipidemia (HCC) Check Labs with PCP  2. Vitamin D  deficiency (Primary) Check Labs with PCP  3. Anxiety Limit caffeine Resume regular exercise when R Hamstring is back to basline  5. Obesity, CURRENT BMI 29.8  Nancy Sparks is currently in the action stage of change. As such, her goal is to continue with weight loss efforts. She has agreed to the Category 3 Plan.   Exercise goals: No exercise has been prescribed at this time.  Behavioral modification strategies: increasing lean protein intake, decreasing simple carbohydrates, increasing vegetables, increasing water intake, no skipping meals, meal planning and cooking strategies, keeping healthy foods in the home, ways to avoid boredom eating, better snacking choices, and planning for success.  Nancy Sparks has agreed to follow-up with our clinic in 4 weeks. She was informed of the importance of frequent follow-up visits to maximize her success with intensive lifestyle modifications  for her multiple health conditions.   Complete Fasting Labs with PCP this Fall- Review in Care EveryWhere  Objective:   Blood pressure 114/73, pulse 78, temperature 98.8 F (37.1 C), height 5' 6 (1.676 m), weight 184 lb (83.5 kg), SpO2 98%. Body mass index is 29.7 kg/m.  General: Cooperative, alert, well developed, in no acute distress. HEENT: Conjunctivae and lids  unremarkable. Cardiovascular: Regular rhythm.  Lungs: Normal work of breathing. Neurologic: No focal deficits.   Lab Results  Component Value Date   CREATININE 0.8 11/27/2022   BUN 20 11/27/2022   NA 141 11/27/2022   K 4.2 11/27/2022   CL 105 11/27/2022   CO2 25 09/25/2022   Lab Results  Component Value Date   ALT 35 (H) 09/25/2022   AST 27 09/25/2022   ALKPHOS 114 11/27/2022   BILITOT 0.4 09/25/2022   Lab Results  Component Value Date   HGBA1C 6.7 11/27/2022   Lab Results  Component Value Date   INSULIN  14.9 02/11/2023   Lab Results  Component Value Date   TSH 2.73 11/27/2022   Lab Results  Component Value Date   CHOL 148 02/11/2023   HDL 69 02/11/2023   LDLCALC 57 02/11/2023   LDLDIRECT 115 (H) 09/03/2017   TRIG 127 02/11/2023   CHOLHDL 3.4 09/25/2022   Lab Results  Component Value Date   VD25OH 24.3 (L) 02/11/2023   Lab Results  Component Value Date   WBC 5.8 11/27/2022   HGB 13.7 11/27/2022   HCT 41 11/27/2022   MCV 89.7 09/25/2022   PLT 194 11/27/2022   No results found for: IRON, TIBC, FERRITIN  Attestation Statements:   Reviewed by clinician on day of visit: allergies, medications, problem list, medical history, surgical history, family history, social history, and previous encounter notes.  Time spent on visit including pre-visit chart review and post-visit care and charting was 27 minutes.   I have reviewed the above documentation for accuracy and completeness, and I agree with the above. -  Raynesha Tiedt d. Aarna Mihalko, NP-C

## 2023-11-27 ENCOUNTER — Encounter (INDEPENDENT_AMBULATORY_CARE_PROVIDER_SITE_OTHER): Payer: Self-pay | Admitting: Adult Health

## 2023-12-04 DIAGNOSIS — E119 Type 2 diabetes mellitus without complications: Secondary | ICD-10-CM | POA: Diagnosis not present

## 2023-12-04 DIAGNOSIS — E78019 Familial hypercholesterolemia, unspecified: Secondary | ICD-10-CM | POA: Diagnosis not present

## 2023-12-09 ENCOUNTER — Encounter: Payer: Self-pay | Admitting: Cardiovascular Disease

## 2023-12-09 ENCOUNTER — Ambulatory Visit: Attending: Cardiovascular Disease | Admitting: Cardiovascular Disease

## 2023-12-09 VITALS — BP 102/74 | HR 73 | Ht 66.0 in | Wt 187.2 lb

## 2023-12-09 DIAGNOSIS — R079 Chest pain, unspecified: Secondary | ICD-10-CM

## 2023-12-09 DIAGNOSIS — Z0000 Encounter for general adult medical examination without abnormal findings: Secondary | ICD-10-CM | POA: Diagnosis not present

## 2023-12-09 DIAGNOSIS — E78011 Heterozygous familial hypercholesterolemia (hefh): Secondary | ICD-10-CM

## 2023-12-09 DIAGNOSIS — I25118 Atherosclerotic heart disease of native coronary artery with other forms of angina pectoris: Secondary | ICD-10-CM | POA: Diagnosis not present

## 2023-12-09 DIAGNOSIS — I493 Ventricular premature depolarization: Secondary | ICD-10-CM | POA: Diagnosis not present

## 2023-12-09 DIAGNOSIS — E119 Type 2 diabetes mellitus without complications: Secondary | ICD-10-CM | POA: Diagnosis not present

## 2023-12-09 DIAGNOSIS — R82998 Other abnormal findings in urine: Secondary | ICD-10-CM | POA: Diagnosis not present

## 2023-12-09 DIAGNOSIS — Z1331 Encounter for screening for depression: Secondary | ICD-10-CM | POA: Diagnosis not present

## 2023-12-09 DIAGNOSIS — Z1339 Encounter for screening examination for other mental health and behavioral disorders: Secondary | ICD-10-CM | POA: Diagnosis not present

## 2023-12-09 DIAGNOSIS — I10 Essential (primary) hypertension: Secondary | ICD-10-CM

## 2023-12-09 MED ORDER — AMLODIPINE BESYLATE 5 MG PO TABS: 5.0000 mg | ORAL_TABLET | Freq: Every day | ORAL | 3 refills | Status: AC

## 2023-12-09 MED ORDER — ISOSORBIDE MONONITRATE ER 30 MG PO TB24: 30.0000 mg | ORAL_TABLET | Freq: Every day | ORAL | 3 refills | Status: AC

## 2023-12-09 MED ORDER — NITROGLYCERIN 0.4 MG SL SUBL: 0.4000 mg | SUBLINGUAL_TABLET | SUBLINGUAL | 3 refills | Status: AC | PRN

## 2023-12-09 MED ORDER — METOPROLOL TARTRATE 50 MG PO TABS: 50.0000 mg | ORAL_TABLET | Freq: Two times a day (BID) | ORAL | 3 refills | Status: DC

## 2023-12-09 MED ORDER — ROSUVASTATIN CALCIUM 40 MG PO TABS: ORAL_TABLET | ORAL | 3 refills | Status: AC

## 2023-12-09 MED ORDER — EZETIMIBE 10 MG PO TABS: 10.0000 mg | ORAL_TABLET | Freq: Every day | ORAL | 3 refills | Status: AC

## 2023-12-09 NOTE — Patient Instructions (Signed)
 Medication Instructions:  Start Zetia 10 mg daily *If you need a refill on your cardiac medications before your next appointment, please call your pharmacy*  Lab Work: Lipid panel- fasting 3 months If you have labs (blood work) drawn today and your tests are completely normal, you will receive your results only by: MyChart Message (if you have MyChart) OR A paper copy in the mail If you have any lab test that is abnormal or we need to change your treatment, we will call you to review the results.  Testing/Procedures: None ordered  Follow-Up: At [facility], you and your health needs are our priority.  As part of our continuing mission to provide you with exceptional heart care, our providers are all part of one team.  This team includes your primary Cardiologist (physician) and Advanced Practice Providers or APPs (Physician Assistants and Nurse Practitioners) who all work together to provide you with the care you need, when you need it.  Your next appointment:   1 year(s)  Provider:   Jerel Balding, MD    We recommend signing up for the patient portal called MyChart.  Sign up information is provided on this After Visit Summary.  MyChart is used to connect with patients for Virtual Visits (Telemedicine).  Patients are able to view lab/test results, encounter notes, upcoming appointments, etc.  Non-urgent messages can be sent to your provider as well.   To learn more about what you can do with MyChart, go to [email or web address].

## 2023-12-09 NOTE — Progress Notes (Unsigned)
 Cardiology Office Note   Date:  12/09/2023   ID:  Nancy Sparks, DOB October 29, 1965, MRN [id number]   PCP:  Vernadine Charlie LELON, MDCardiologist:  Jerel Balding, MD  Electrophysiologist:  None   Evaluation Performed:  Follow-Up Visit  Chief Complaint:  CAD, HTN, HFH  History of Present Illness:    Nancy Sparks is a 58 y.o. female with systemic hypertension and severe hypercholesterolemia almost certainly related to heterozygous familial hypercholesterolemia who presented with unstable angina and received a drug-eluting stent to the right coronary artery in June 2019.  She also had a 75% stenosis in a small first diagonal artery left for medical therapy.  She had recurrent problems with angina after the procedure despite having no evidence of problems on repeat angiography, suggesting she may have a component of coronary vasospasm.    After a long period of time for her chest complaints she began experiencing some shortness of breath and chest tightness climbing stairs in the last roughly 3 months.  This is not consistent.  Sometimes she feels short of breath at rest, without provocation.  She has not had activity limiting symptoms.  She had a normal nuclear perfusion pattern on a SPECT study 12/08/2018.  She does have PVCs which are mildly symptomatic (probably cause her sensation of brief shortness of breath at rest), documented on event monitor.  She is trying to stay physically active.  BMI is now 31, mildly obese range.  She is no longer taking any GLP-1 agonist due to side effects.  Glycemic control is fair with a hemoglobin A1c most recently 5.8%.  Her most recent LDL cholesterol was 127, which is outside typical target range but is a marked improvement compared to her baseline untreated LDL cholesterol 217.  He is taking both Repatha  and rosuvastatin .  ECG shows normal sinus rhythm and borderline early repull changes.   Past Medical History:  Diagnosis Date   Anemia     w/pregnancies   Anxiety    hx of   Back pain    CAD (coronary artery disease)    LHC 6/19: pLAD, oD1 75, pRCA 20, mRCA 90 >> PCI: DES to RCA // Echo 6/19: EF 55-60, normal wall motion   Complication of anesthesia    itching relieved with benadryl  w/all my surgeries (07/24/2017)   Depression    hx   Diabetes (HCC)    Dyslipidemia    Fibroid    GERD (gastroesophageal reflux disease)    PRN meds   Gestational diabetes    hx of   Heart murmur    hx   Heart palpitations    hx of   Hx of adenomatous polyp of colon 12/24/2002   Hyperlipidemia    on meds   Hypertension    on meds   Past Surgical History:  Procedure Laterality Date   BACK SURGERY     CESAREAN SECTION  1996; 1998; 2001   COLONOSCOPY  2017   CG-MAC-movi(good)-2 polyps-TA x 1, tics   CORONARY STENT INTERVENTION N/A 07/28/2017   Procedure: CORONARY STENT INTERVENTION;  Surgeon: Burnard Debby LABOR, MD;  Location: [facility];  Service: Cardiovascular;  Laterality: N/A;  RCA   LEFT HEART CATH AND CORONARY ANGIOGRAPHY N/A 07/28/2017   Procedure: LEFT HEART CATH AND CORONARY ANGIOGRAPHY;  Surgeon: Burnard Debby LABOR, MD;  Location: [facility];  Service: Cardiovascular;  Laterality: N/A;   LEFT HEART CATH AND CORONARY ANGIOGRAPHY N/A 09/04/2017  Procedure: LEFT HEART CATH AND CORONARY ANGIOGRAPHY;  Surgeon: Wonda Sharper, MD;  Location: [facility];  Service: Cardiovascular;  Laterality: N/A;   LUMBAR LAMINECTOMY/DECOMPRESSION MICRODISCECTOMY  04/25/2011   Procedure: LUMBAR LAMINECTOMY/DECOMPRESSION MICRODISCECTOMY 1 LEVEL;  Surgeon: Arley SHAUNNA Helling, MD;  Location: [facility];  Service: Neurosurgery;  Laterality: Left;  Left Lumbar Three-Four Laminectomy and Diskectomy   LUMBAR LAMINECTOMY/DECOMPRESSION MICRODISCECTOMY  04/26/2011   Procedure: LUMBAR LAMINECTOMY/DECOMPRESSION MICRODISCECTOMY 1 LEVEL;  Surgeon: Arley SHAUNNA Helling, MD;  Location: [facility];  Service: Neurosurgery;  Laterality: N/A;  Re-Exploration  of Lumbar Wound and Evacuation of Epidural Hematoma   NOVASURE ABLATION  2006   POLYPECTOMY     TA x 1   TUBAL LIGATION  2001   WISDOM TOOTH EXTRACTION       Current Meds  Medication Sig   amLODipine  (NORVASC ) 5 MG tablet Take 1 tablet (5 mg total) by mouth daily.   aspirin  EC 81 MG tablet Take 1 tablet (81 mg total) by mouth daily.   cetirizine  (ZYRTEC  ALLERGY) 10 MG tablet Take 1 tablet (10 mg total) by mouth daily for 14 days.   fluticasone  (FLONASE ) 50 MCG/ACT nasal spray Place 2 sprays into both nostrils daily.   isosorbide  mononitrate (IMDUR ) 30 MG 24 hr tablet TAKE 1 TABLET BY MOUTH EVERY DAY   metoprolol  tartrate (LOPRESSOR ) 50 MG tablet Take 1 tablet (50 mg total) by mouth 2 (two) times daily.   rosuvastatin  (CRESTOR ) 40 MG tablet TAKE 1 TABLET(40 MG) BY MOUTH AT BEDTIME   Semaglutide  (OZEMPIC, 0.25 OR 0.5 MG/DOSE, [place]) Inject 0.5 mg into the skin once a week.   traZODone  (DESYREL ) 150 MG tablet Take 1 tablet (150 mg total) by mouth at bedtime.   Vitamin D , Ergocalciferol , (DRISDOL ) 1.25 MG (50000 UNIT) CAPS capsule Take 1 capsule (50,000 Units total) by mouth every 7 (seven) days.     Allergies:   Latex and Tape   Social History   Tobacco Use   Smoking status: Former    Current packs/day: 0.10    Average packs/day: 0.1 packs/day for 2.0 years (0.2 ttl pk-yrs)    Types: Cigarettes   Smokeless tobacco: Never   Tobacco comments:    quit smoking in college  Vaping Use   Vaping status: Never Used  Substance Use Topics   Alcohol  use: Yes    Alcohol /week: 5.0 - 6.0 standard drinks of alcohol     Types: 5 - 6 Standard drinks or equivalent per week   Drug use: Not Currently     Family Hx: The patient's family history includes Anesthesia problems in her mother and sister; Atrial fibrillation in her father; Colon cancer (age of onset: 73) in her sister; Colon polyps (age of onset: 19) in her sister; Colon polyps (age of onset: 89) in her mother; Coronary artery disease in her  father; Diabetes in her father; Heart disease in her father; Hyperlipidemia in her brother, father, mother, and sister; Hypertension in her father and mother; Other in her sister; Thyroid  disease in her sister. There is no history of Esophageal cancer, Stomach cancer, or Rectal cancer.  ROS:   Please see the history of present illness.    All other systems reviewed and are negative.   Prior CV studies:   The following studies were reviewed today: Lexiscan  Myoview  12/08/2018 Nuclear stress EF: 64%. The left ventricular ejection fraction is normal (55-65%). There was no ST segment deviation noted during stress. This is a low risk study. There is  no evidence of ischemia The study is normal.  Event monitor 08/24/2019 Dominant rhythm is normal sinus with normal circadian variation. There are relatively frequent premature ventricular contractions representing 2% of all QRS complexes. The vast majority of these have the same morphology. These sometimes occur in a pattern of trigeminy. Ventricular couplets are rarely seen. There is no evidence of ventricular tachycardia, atrial fibrillation or meaningful bradycardia or pauses. The patient symptoms appear to correlate with periods of increased frequency of premature ventricular beats.   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 This explains the apparent variations in pulse rate (as PVCs may not be detected by the visual receptor of the Apple watch).    Labs/Other Tests and Data Reviewed:    EKG: Ordered today and shows normal sinus rhythm, borderline changes of early repolarization. Recent Labs: 01/05/2019 TSH 3.29 08/18/2018 creatinine 0.7, hemoglobin 12.6, hemoglobin A1c 5.2%, normal LFTs  08/30/2019 Creatinine 0.8, hemoglobin A1c 5.6%, hemoglobin 13.3, normal liver functions  05/05/2020 Hemoglobin 13.7, creatinine 0.9, potassium 3.5, ALT 36   Recent Lipid Panel Lab Results  Component Value Date/Time   CHOL 148  02/11/2023 09:27 AM   TRIG 127 02/11/2023 09:27 AM   HDL 69 02/11/2023 09:27 AM   CHOLHDL 3.4 09/25/2022 11:15 AM   LDLCALC 57 02/11/2023 09:27 AM   LDLCALC 127 (H) 09/25/2022 11:15 AM   LDLDIRECT 115 (H) 09/03/2017 12:09 PM   LDLDIRECT 155.5 05/14/2011 09:24 AM    08/30/2019 Total cholesterol 149, HDL 63, LDL 49, triglycerides 186  04/18/2020 Cholesterol 165 HDL 72, LDL 58, triglycerides 179  Wt Readings from Last 3 Encounters:  12/09/23 187 lb 3.2 oz (84.9 kg)  11/26/23 184 lb (83.5 kg)  11/05/23 185 lb (83.9 kg)     Objective:    Vital Signs:  BP 102/74 (BP Location: Left Arm, Patient Position: Sitting, Cuff Size: Normal)   Pulse 73   Ht 5' 6 (1.676 m)   Wt 187 lb 3.2 oz (84.9 kg)   SpO2 96%   BMI 30.21 kg/m     General: Alert, oriented x3, no distress, mildly obese Head: no evidence of trauma, PERRL, EOMI, no exophtalmos or lid lag, no myxedema, no xanthelasma; normal ears, nose and oropharynx Neck: normal jugular venous pulsations and no hepatojugular reflux; brisk carotid pulses without delay and no carotid bruits Chest: clear to auscultation, no signs of consolidation by percussion or palpation, normal fremitus, symmetrical and full respiratory excursions Cardiovascular: normal position and quality of the apical impulse, regular rhythm, normal first and second heart sounds, no murmurs, rubs or gallops Abdomen: no tenderness or distention, no masses by palpation, no abnormal pulsatility or arterial bruits, normal bowel sounds, no hepatosplenomegaly Extremities: no clubbing, cyanosis or edema; 2+ radial, ulnar and brachial pulses bilaterally; 2+ right femoral, posterior tibial and dorsalis pedis pulses; 2+ left femoral, posterior tibial and dorsalis pedis pulses; no subclavian or femoral bruits Neurological: grossly nonfocal Psych: Normal mood and affect  ASSESSMENT & PLAN:    1. Coronary artery disease of native artery of native heart with stable angina pectoris    2. Heterozygous familial hypercholesterolemia   3. Essential hypertension   4. Chest pain of uncertain etiology      CAD: Symptoms have been well-controlled on a combination of metoprolol , amlodipine , long-acting nitrates, but now have recurred.  Will schedule her for a PET scan. HFH: Despite rosuvastatin  and Repatha , her LDL cholesterol still elevated.  When she next has a lab draw, need to check LP(a), but  she is already on a PCSK9 inhibitor. HTN: Well-controlled. PVCs: Minimally symptomatic.  Was intolerant of higher doses of metoprolol  for fatigue.   There are no Patient Instructions on file for this visit.  Signed, Jerel Balding, MD  12/09/2023 11:28 AM    [facility]

## 2023-12-19 ENCOUNTER — Other Ambulatory Visit: Payer: Self-pay | Admitting: Emergency Medicine

## 2023-12-19 DIAGNOSIS — E78011 Heterozygous familial hypercholesterolemia (hefh): Secondary | ICD-10-CM

## 2023-12-24 ENCOUNTER — Ambulatory Visit (INDEPENDENT_AMBULATORY_CARE_PROVIDER_SITE_OTHER): Payer: Self-pay | Admitting: Adult Health

## 2024-01-12 ENCOUNTER — Encounter: Payer: Self-pay | Admitting: Cardiovascular Disease

## 2024-02-16 ENCOUNTER — Other Ambulatory Visit: Payer: Self-pay

## 2024-02-16 MED ORDER — METOPROLOL TARTRATE 50 MG PO TABS: 50.0000 mg | ORAL_TABLET | Freq: Two times a day (BID) | ORAL | 3 refills | Status: AC
